# Patient Record
Sex: Female | Born: 1935 | Race: White | Hispanic: No | Smoking: Never smoker
Health system: Southern US, Community
[De-identification: ages and names within clinical notes are randomized; demographics above are authoritative.]

## PROBLEM LIST (undated history)

## (undated) DIAGNOSIS — I509 Heart failure, unspecified: Secondary | ICD-10-CM

## (undated) DIAGNOSIS — K219 Gastro-esophageal reflux disease without esophagitis: Secondary | ICD-10-CM

## (undated) DIAGNOSIS — N200 Calculus of kidney: Secondary | ICD-10-CM

## (undated) DIAGNOSIS — R42 Dizziness and giddiness: Secondary | ICD-10-CM

## (undated) DIAGNOSIS — M47817 Spondylosis without myelopathy or radiculopathy, lumbosacral region: Secondary | ICD-10-CM

## (undated) DIAGNOSIS — M1990 Unspecified osteoarthritis, unspecified site: Secondary | ICD-10-CM

## (undated) DIAGNOSIS — R112 Nausea with vomiting, unspecified: Secondary | ICD-10-CM

## (undated) DIAGNOSIS — I779 Disorder of arteries and arterioles, unspecified: Secondary | ICD-10-CM

## (undated) DIAGNOSIS — I4819 Other persistent atrial fibrillation: Secondary | ICD-10-CM

## (undated) DIAGNOSIS — T7840XA Allergy, unspecified, initial encounter: Secondary | ICD-10-CM

## (undated) DIAGNOSIS — Z800 Family history of malignant neoplasm of digestive organs: Secondary | ICD-10-CM

## (undated) DIAGNOSIS — I739 Peripheral vascular disease, unspecified: Secondary | ICD-10-CM

## (undated) DIAGNOSIS — G4700 Insomnia, unspecified: Secondary | ICD-10-CM

## (undated) DIAGNOSIS — C50919 Malignant neoplasm of unspecified site of unspecified female breast: Secondary | ICD-10-CM

## (undated) DIAGNOSIS — Z9889 Other specified postprocedural states: Secondary | ICD-10-CM

## (undated) HISTORY — DX: Peripheral vascular disease, unspecified: I73.9

## (undated) HISTORY — DX: Spondylosis without myelopathy or radiculopathy, lumbosacral region: M47.817

## (undated) HISTORY — DX: Family history of malignant neoplasm of digestive organs: Z80.0

## (undated) HISTORY — DX: Disorder of arteries and arterioles, unspecified: I77.9

## (undated) HISTORY — DX: Unspecified osteoarthritis, unspecified site: M19.90

## (undated) HISTORY — DX: Other persistent atrial fibrillation: I48.19

## (undated) HISTORY — PX: KIDNEY STONE SURGERY: SHX686

## (undated) HISTORY — DX: Calculus of kidney: N20.0

## (undated) HISTORY — DX: Dizziness and giddiness: R42

## (undated) HISTORY — PX: OTHER SURGICAL HISTORY: SHX169

## (undated) HISTORY — DX: Gastro-esophageal reflux disease without esophagitis: K21.9

## (undated) HISTORY — DX: Insomnia, unspecified: G47.00

---

## 1958-02-14 HISTORY — PX: DILATION AND CURETTAGE OF UTERUS: SHX78

## 1997-07-25 ENCOUNTER — Other Ambulatory Visit: Admission: RE | Admit: 1997-07-25 | Discharge: 1997-07-25 | Payer: Self-pay | Admitting: *Deleted

## 1998-07-24 ENCOUNTER — Encounter: Payer: Self-pay | Admitting: Orthopedic Surgery

## 1998-07-24 ENCOUNTER — Ambulatory Visit (HOSPITAL_COMMUNITY): Admission: RE | Admit: 1998-07-24 | Discharge: 1998-07-24 | Payer: Self-pay | Admitting: Orthopedic Surgery

## 1998-08-17 ENCOUNTER — Other Ambulatory Visit: Admission: RE | Admit: 1998-08-17 | Discharge: 1998-08-17 | Payer: Self-pay | Admitting: *Deleted

## 1999-09-03 ENCOUNTER — Other Ambulatory Visit: Admission: RE | Admit: 1999-09-03 | Discharge: 1999-09-03 | Payer: Self-pay | Admitting: *Deleted

## 2000-10-18 ENCOUNTER — Other Ambulatory Visit: Admission: RE | Admit: 2000-10-18 | Discharge: 2000-10-18 | Payer: Self-pay | Admitting: *Deleted

## 2004-02-25 ENCOUNTER — Other Ambulatory Visit: Admission: RE | Admit: 2004-02-25 | Discharge: 2004-02-25 | Payer: Self-pay | Admitting: Radiology

## 2006-05-03 ENCOUNTER — Ambulatory Visit: Payer: Self-pay | Admitting: Vascular Surgery

## 2006-11-07 ENCOUNTER — Ambulatory Visit: Payer: Self-pay | Admitting: Vascular Surgery

## 2006-11-15 ENCOUNTER — Ambulatory Visit: Payer: Self-pay | Admitting: Vascular Surgery

## 2007-05-08 ENCOUNTER — Ambulatory Visit: Payer: Self-pay | Admitting: Vascular Surgery

## 2007-11-06 ENCOUNTER — Ambulatory Visit: Payer: Self-pay | Admitting: Vascular Surgery

## 2008-04-17 ENCOUNTER — Ambulatory Visit: Payer: Self-pay | Admitting: Vascular Surgery

## 2008-10-21 ENCOUNTER — Ambulatory Visit: Payer: Self-pay | Admitting: Vascular Surgery

## 2009-05-07 ENCOUNTER — Ambulatory Visit: Payer: Self-pay | Admitting: Vascular Surgery

## 2009-11-11 ENCOUNTER — Ambulatory Visit: Payer: Self-pay | Admitting: Vascular Surgery

## 2010-05-13 ENCOUNTER — Other Ambulatory Visit (INDEPENDENT_AMBULATORY_CARE_PROVIDER_SITE_OTHER): Payer: Medicare PPO

## 2010-05-13 ENCOUNTER — Ambulatory Visit (INDEPENDENT_AMBULATORY_CARE_PROVIDER_SITE_OTHER): Payer: Medicare PPO

## 2010-05-13 DIAGNOSIS — I6529 Occlusion and stenosis of unspecified carotid artery: Secondary | ICD-10-CM

## 2010-05-20 NOTE — Procedures (Unsigned)
CAROTID DUPLEX EXAM  INDICATION:  Carotid stenosis.  HISTORY: Diabetes:  No. Cardiac:  No. Hypertension:  No. Smoking:  No. Previous Surgery:  No. CV History:  Currently asymptomatic. Amaurosis Fugax No, Paresthesias No, Hemiparesis No.                                      RIGHT             LEFT Brachial systolic pressure:         130               126 Brachial Doppler waveforms:         Normal            Normal Vertebral direction of flow:        Antegrade         Antegrade DUPLEX VELOCITIES (cm/sec) CCA peak systolic                   96                88 ECA peak systolic                   51                65 ICA peak systolic                   214               71 ICA end diastolic                   50                22 PLAQUE MORPHOLOGY:                  Mixed             Mixed PLAQUE AMOUNT:                      Moderate          Mild PLAQUE LOCATION:                    ICA/ECA           ICA/ECA  IMPRESSION: 1. Doppler velocities suggest a high-end 40% to 59% stenosis of the     right proximal internal carotid artery. 2. No hemodynamically significant stenosis noted in the left internal     carotid artery with plaque formations as described above. 3. No significant change noted when compared to the previous     examination on 11/11/2009.  ___________________________________________ Di Kindle. Edilia Bo, M.D.  CH/MEDQ  D:  05/14/2010  T:  05/14/2010  Job:  [id number]

## 2010-06-29 NOTE — Procedures (Signed)
CAROTID DUPLEX EXAM   INDICATION:  Follow up carotid artery disease.   HISTORY:  Diabetes:  No.  Cardiac:  No.  Hypertension:  No.  Smoking:  No.  Previous Surgery:  No.  CV History:  Amaurosis Fugax No, Paresthesias No, Hemiparesis No                                       RIGHT             LEFT  Brachial systolic pressure:         110               110  Brachial Doppler waveforms:         Biphasic          Biphasic  Vertebral direction of flow:        Antegrade         Antegrade  DUPLEX VELOCITIES (cm/sec)  CCA peak systolic                   100               93  ECA peak systolic                   64                79  ICA peak systolic                   247               65  ICA end diastolic                   77                21  PLAQUE MORPHOLOGY:                  Calcified         Mixed  PLAQUE AMOUNT:                      Moderate to large Mild  PLAQUE LOCATION:                    ICA               Distal CCA, ICA   IMPRESSION:  1. Right ICA stenosis 60-79%.  2. Left ICA stenosis 20-39%.  3. Slight increase in right ICA velocity since previous exam.  Patient      is asymptomatic at this time.   ___________________________________________  Di Kindle. Edilia Bo, M.D.   DP/MEDQ  D:  05/08/2007  T:  05/08/2007  Job:  [id number]

## 2010-06-29 NOTE — Procedures (Signed)
CAROTID DUPLEX EXAM   INDICATION:  Follow up known carotid artery disease.   HISTORY:  Diabetes:  No.  Cardiac:  No.  Hypertension:  No.  Smoking:  No.  Previous Surgery:  CV History:  Amaurosis Fugax No, Paresthesias No, Hemiparesis No                                       RIGHT             LEFT  Brachial systolic pressure:         120               118  Brachial Doppler waveforms:         Biphasic          Biphasic  Vertebral direction of flow:        Antegrade         Antegrade  DUPLEX VELOCITIES (cm/sec)  CCA peak systolic                   87                91  ECA peak systolic                   75                78  ICA peak systolic                   201               99  ICA end diastolic                   57                35  PLAQUE MORPHOLOGY:                  Heterogenous      Heterogenous  PLAQUE AMOUNT:                      Moderate          Mild  PLAQUE LOCATION:                    ICA, ECA          ICA, ECA   IMPRESSION:  1. A 60-90% stenosis noted in the right internal carotid artery.  2. A 20-39%  stenosis noted in the left internal carotid artery.  3. Antegrade bilateral vertebral arteries.   ___________________________________________  Di Kindle. Edilia Bo, M.D.   MG/MEDQ  D:  11/07/2006  T:  11/07/2006  Job:  [id number]

## 2010-06-29 NOTE — Procedures (Signed)
CAROTID DUPLEX EXAM   INDICATION:  Follow up known carotid artery disease.   HISTORY:  Diabetes:  No.  Cardiac:  No.  Hypertension:  No.  Smoking:  No.  Previous Surgery:  No.  CV History:  No.  Amaurosis Fugax No, Paresthesias No, Hemiparesis No                                       RIGHT             LEFT  Brachial systolic pressure:         120               118  Brachial Doppler waveforms:         biphasic          biphasic  Vertebral direction of flow:        antegrade         antegrade  DUPLEX VELOCITIES (cm/sec)  CCA peak systolic                   89                100  ECA peak systolic                   103               96  ICA peak systolic                   247               89  ICA end diastolic                   60                27  PLAQUE MORPHOLOGY:                  heterogeneous     heterogeneous  PLAQUE AMOUNT:                      moderate to severe                  mild  PLAQUE LOCATION:                    bifurcation, ICA and ECA            ICA and ECA.   IMPRESSION:  1. A 60-79% stenosis noted in the right bifurcation and proximal      internal carotid artery.  2. A 20-39% stenosis noted in the left internal carotid artery.  3. Antegrade bilateral vertebral arteries.   ___________________________________________  Di Kindle. Edilia Bo, M.D.   MG/MEDQ  D:  10/21/2008  T:  10/21/2008  Job:  [id number]

## 2010-06-29 NOTE — Procedures (Signed)
CAROTID DUPLEX EXAM   INDICATION:  Followup evaluation of known carotid artery disease.   HISTORY:  Diabetes:  No.  Cardiac:  No.  Hypertension:  No.  Smoking:  No.  Previous Surgery:  No.  CV History:  Previous duplex on 11/06/2007 revealed a 60-79% right ICA  stenosis and 20-39% left ICA stenosis.  The patient reports an episode  of jaw pain and swelling last night.  Amaurosis Fugax No, Paresthesias No, Hemiparesis No                                       RIGHT             LEFT  Brachial systolic pressure:         146               138  Brachial Doppler waveforms:         Triphasic         Triphasic  Vertebral direction of flow:        Antegrade         Antegrade  DUPLEX VELOCITIES (cm/sec)  CCA peak systolic                   77                107  ECA peak systolic                   121               99  ICA peak systolic                   275               90  ICA end diastolic                   71                33  PLAQUE MORPHOLOGY:                  Mixed             Soft  PLAQUE AMOUNT:                      Moderate          Mild  PLAQUE LOCATION:                    Distal CCA, proximal ICA            Proximal ICA   IMPRESSION:  1. Distal right CCA peak systolic velocity 258 and diastolic velocity      27.  2. 60-79% right ICA stenosis.  3. 20-39% left ICA stenosis.  4. No significant change from previous study.   ___________________________________________  Di Kindle. Edilia Bo, M.D.   MC/MEDQ  D:  04/17/2008  T:  04/17/2008  Job:  [id number]

## 2010-06-29 NOTE — Procedures (Signed)
CAROTID DUPLEX EXAM   INDICATION:  Follow up carotid artery disease.   HISTORY:  Diabetes:  No.  Cardiac:  No.  Hypertension:  No.  Smoking:  No.  Previous Surgery:  No.  CV History:  Asymptomatic.  Amaurosis Fugax No, Paresthesias No, Hemiparesis No.                                       RIGHT             LEFT  Brachial systolic pressure:         138               140  Brachial Doppler waveforms:         WNL               WNL  Vertebral direction of flow:        Antegrade         Antegrade  DUPLEX VELOCITIES (cm/sec)  CCA peak systolic                   M = 91, D = 186   97  ECA peak systolic                   140               76  ICA peak systolic                   212               115  ICA end diastolic                   53                43  PLAQUE MORPHOLOGY:                  Heterogenous      Heterogenous  PLAQUE AMOUNT:                      Moderate          Mild  PLAQUE LOCATION:                    Bifurcation/ICA   Proximal  ICA/bifurcation   IMPRESSION:  1. Right internal carotid artery shows evidence of 40% to 59% stenosis      (top end of range).  Focal calcific plaque noted within the right      distal common carotid artery and right proximal internal carotid      artery.  Unable to obtain velocities of previous study.  2. Left internal carotid artery now shows evidence of 40% to 59%      stenosis (low end of range).    ___________________________________________  Di Kindle. Edilia Bo, M.D.   OD/MEDQ  D:  11/11/2009  T:  11/11/2009  Job:  [id number]

## 2010-06-29 NOTE — Assessment & Plan Note (Signed)
OFFICE VISIT   TERESEA, DONLEY  DOB:  [birth year]                                       11/07/2006  EAVWU#:[id number]   I saw the patient in the office today for continued followup of her  carotid disease.  Since I saw her last in March of this year, she has  had no history of stroke, TIAs, expressive or receptive aphasia, or  amaurosis fugax.   On review of systems, she has had no recent chest pain, chest pressure,  palpitations, or arrhythmias.  She does admit to some arthritis in her  back and neck and occasionally has some problems with standing.  I did  not get any history of claudication or rest pain.   PHYSICAL EXAMINATION:  Blood pressure is 130/77.  Heart rate is 84.  She  has a right carotid bruit.  Lungs are clear bilaterally to auscultation.  Cardiac:  She has a regular rate and rhythm.  Her abdomen is soft and  nontender.  Neurologic:  Nonfocal.   Her carotid duplex scan shows that she has a 60-79% right carotid  stenosis in the mid range.  She has a less than 39% left carotid  stenosis.  Both vertebral arteries are patent with normally directed  flow, and her arm pressures are equal.   She understands we would not consider right carotid endarterectomy  unless the stenosis progressed to greater than 80% or she develops new  neurologic symptoms.  I plan on seeing her back in six months with a  follow-up duplex scan.  She knows to call sooner if she has problems.  In the meantime, she knows to continue taking her aspirin.   In addition, she had some concerns about some varicose veins in her  legs, and I have suggested she consider making an appointment with the  vein center here.   Di Kindle. Edilia Bo, M.D.  Electronically Signed   CSD/MEDQ  D:  11/07/2006  T:  11/08/2006  Job:  [id number]

## 2010-06-29 NOTE — Procedures (Signed)
CAROTID DUPLEX EXAM   INDICATION:  Follow up of carotid disease.   HISTORY:  Diabetes:  No.  Cardiac:  No.  Hypertension:  No.  Smoking:  No.  Previous Surgery:  No.  CV History:  Asymptomatic.  Amaurosis Fugax , Paresthesias , Hemiparesis                                       RIGHT             LEFT  Brachial systolic pressure:         142               140  Brachial Doppler waveforms:         Triphasic         Triphasic  Vertebral direction of flow:        Antegrade         Antegrade  DUPLEX VELOCITIES (cm/sec)  CCA peak systolic                   116               107  ECA peak systolic                   85                94  ICA peak systolic                   270 (BIF)         98  ICA end diastolic                   84                37  PLAQUE MORPHOLOGY:                  Calcified         Mixed  PLAQUE AMOUNT:                      Moderate-to-severe                  Mild  PLAQUE LOCATION:                    Bifurcation, ICA  CCA   IMPRESSION:  1. 60-79% stenosis noted in the right bifurcation and proximal      internal carotid artery.  2. Mild plaque noted at the left common carotid artery, bifurcation,      internal carotid artery.        ___________________________________________  Di Kindle. Edilia Bo, M.D.   CJ/MEDQ  D:  05/07/2009  T:  05/07/2009  Job:  [id number]

## 2010-06-29 NOTE — Assessment & Plan Note (Signed)
OFFICE VISIT   Colleen Galloway, Colleen Galloway  DOB:  1934/05/17                                       11/11/2009  [id number]#:[id number]   I saw this patient in the office today for continued followup of her  carotid disease.  I had last seen her back in September 2008.  She had a  60% to 79% right carotid stenosis in the mid range and a less than 39%  left carotid stenosis.  She comes in for routine visit.  Since I saw her  last, she has had no history of stroke, TIAs, expressive or receptive  aphasia, or amaurosis fugax.   PAST MEDICAL HISTORY:  Significant for hypercholesterolemia which is  stable on her current medications.  She denies any history of diabetes,  history of hypertension, history of myocardial infarction or history of  congestive heart failure.   SOCIAL HISTORY:  She is widowed.  She does not have any children.  She  does not use tobacco.   REVIEW OF SYSTEMS:  CARDIOVASCULAR:  She notes an occasional skipped  heart beat.  She has had no chest pain or chest pressure.  She has had  no orthopnea and no significant dyspnea on exertion.  She has had no  history of claudication, rest pain or nonhealing ulcers.  She has had  phlebitis in the past.  NEUROLOGIC:  She has occasional dizziness.  GU:  She does have some dysuria and occasional urinary frequency.   PHYSICAL EXAMINATION:  General:  This is a pleasant 76 year old woman  who appears stated age.  Vital signs:  Blood pressure 127/80, heart rate  is 63, saturation 98%.  Lungs:  Clear bilaterally to auscultation.  Cardiovascular:  She has a right carotid bruit.  She has a regular rate  and rhythm.  She has palpable femoral pulses and warm well-perfused feet  without ischemic ulcers.  She has no significant lower extremity  swelling.  Abdomen:  Soft and nontender with normal-pitched bowel  sounds.  Neurologic:  Nonfocal.   I did independently interpret her carotid duplex scan today which shows  a 40%-59%  right carotid stenosis in the higher end that range with no  significant carotid stenosis on the left.  Both vertebral arteries are  patent with normally directed flow.  The velocities on the right have  actually improved compared to her most recent study in March 2011 when  she was at the lower end of 60%-79% stenosis.   She is due for a follow-up study in 6 months.  If this remains stable,  we might be able to stretch her followup out to a year.  I will plan on  seeing her back in 18 months unless there is any significant change in  her carotid duplex or she develops any new neurologic symptoms.  She  does know to continue taking her aspirin.     Di Kindle. Edilia Bo, M.D.  Electronically Signed   CSD/MEDQ  D:  11/11/2009  T:  11/12/2009  Job:  [id number]   cc:   Dr. Abner Greenspan

## 2010-06-29 NOTE — Procedures (Signed)
CAROTID DUPLEX EXAM   INDICATION:  Follow-up evaluation of known carotid artery disease.   HISTORY:  Diabetes:  No.  Cardiac:  No.  Hypertension:  No.  Smoking:  No.  Previous Surgery:  No.  CV History:  Previous duplex performed on 05/08/07 revealed 60-79% right  ICA stenosis and 20-39% left ICA stenosis.  Amaurosis Fugax No, Paresthesias No, Hemiparesis No.                                       RIGHT             LEFT  Brachial systolic pressure:         128               128  Brachial Doppler waveforms:         Triphasic         Triphasic  Vertebral direction of flow:        Antegrade         Antegrade  DUPLEX VELOCITIES (cm/sec)  CCA peak systolic                   57                83  ECA peak systolic                   74                66  ICA peak systolic                   231               75  ICA end diastolic                   69                25  PLAQUE MORPHOLOGY:                  Soft              Soft  PLAQUE AMOUNT:                      Moderate          Mild  PLAQUE LOCATION:                    Proximal ICA      Proximal ICA   IMPRESSION:  1. 60-79% right internal carotid artery stenosis.  2. 20-39% left internal carotid artery stenosis.  3. No significant change from previous study performed on 05/08/07.   ___________________________________________  Di Kindle. Edilia Bo, M.D.   MC/MEDQ  D:  11/06/2007  T:  11/06/2007  Job:  [id number]

## 2010-06-29 NOTE — Assessment & Plan Note (Signed)
OFFICE VISIT   Colleen Galloway, Colleen Galloway  DOB:  June 06, 1934                                       11/15/2006  [id number]#:[id number]   The patient presents today for evaluation of lower extremity discomfort.  She is concerned regarding pain, burning sensations in both lower  extremities.  This seems to be most pronounced over her right lateral  calf and left pretibial area.  She does not have any distal swelling in  her lower extremities.  Does not have any history of deep vein  thrombosis or superficial thrombophlebitis.  She has had only graduated  compression stockings 20-33 mmHg 3 to 4 years and does elevate her legs  when possible.  She has had multiple prior sclerotherapy sessions at an  outlying vein center from 2004 through 2007.  She has a past medical  history of elevated triglycerides, having known right carotid stenosis.  She has been followed with ultrasound.  Has not had a history of cardiac  disease or stroke, cancer, or diabetes.   PHYSICAL EXAM:  She is a well-developed, well-nourished white female  appearing stated age of 80.  Blood pressure is 129/80, pulse 70,  respirations 16.  She has 2+ dorsalis pedis pulses bilaterally.  She  does not have any reticular or varicose veins.  She does have spider  vein telangiectasia over her right knee and lateral calf, and throughout  her left calf as well.  She underwent hand-held venous duplex by me, and  this shows she has a small size of her great saphenous vein bilaterally  with no evidence of reflux.  I discussed options with the patient.  I  explained that, I would not expect any symptom release of the stinging  sensation that she has with any attempted sclerotherapy.  She reports  that she has never had any pain resolution when she had prior  sclerotherapy attempts in the past.  She was reassured with this  finding.  Will continue her compression garment use, and will notify us  should she wish to proceed  with  sclerotherapy for cosmetic reasons.  Otherwise, she will see [place] on an as  needed basis.  Continue her followup with Dr. Edilia Bo in 6 months for  followup of her asymptomatic carotid stenosis.   Larina Earthly, M.D.  Electronically Signed   TFE/MEDQ  D:  11/15/2006  T:  11/16/2006  Job:  [id number]   cc:   Di Kindle. Edilia Bo, M.D.  Dr. Abner Greenspan

## 2010-09-26 ENCOUNTER — Encounter: Payer: Self-pay | Admitting: Internal Medicine

## 2010-09-27 ENCOUNTER — Encounter: Payer: Self-pay | Admitting: Internal Medicine

## 2010-10-07 ENCOUNTER — Encounter: Payer: Self-pay | Admitting: Cardiovascular Disease

## 2010-10-19 ENCOUNTER — Encounter: Payer: Self-pay | Admitting: Cardiovascular Disease

## 2010-10-20 ENCOUNTER — Ambulatory Visit (INDEPENDENT_AMBULATORY_CARE_PROVIDER_SITE_OTHER): Payer: Medicare PPO | Admitting: Cardiovascular Disease

## 2010-10-20 ENCOUNTER — Encounter: Payer: Self-pay | Admitting: Cardiovascular Disease

## 2010-10-20 DIAGNOSIS — R0600 Dyspnea, unspecified: Secondary | ICD-10-CM | POA: Insufficient documentation

## 2010-10-20 DIAGNOSIS — R0989 Other specified symptoms and signs involving the circulatory and respiratory systems: Secondary | ICD-10-CM | POA: Insufficient documentation

## 2010-10-20 DIAGNOSIS — R002 Palpitations: Secondary | ICD-10-CM | POA: Insufficient documentation

## 2010-10-20 DIAGNOSIS — E782 Mixed hyperlipidemia: Secondary | ICD-10-CM

## 2010-10-20 DIAGNOSIS — R0609 Other forms of dyspnea: Secondary | ICD-10-CM

## 2010-10-20 DIAGNOSIS — R0602 Shortness of breath: Secondary | ICD-10-CM

## 2010-10-20 HISTORY — DX: Mixed hyperlipidemia: E78.2

## 2010-10-20 HISTORY — DX: Other specified symptoms and signs involving the circulatory and respiratory systems: R09.89

## 2010-10-20 NOTE — Assessment & Plan Note (Signed)
F/U duplex Dr Durwin Nora  ASA

## 2010-10-20 NOTE — Progress Notes (Signed)
76 yo referred by Dr Tomasa Blase for vascular disease, palpitations ? PAF, and dyspnea.  Seen in [place] ER 8/14 no records available.  Reviewed records from Dr Tomasa Blase 10/06/10.  I don't think afib was ever documented.  Has had palpitations for over a year.  Bening skips usually not long lasting but did last all afternoon the day she went to ER.  Given lopresser but after a week stopped it because she felt awful and dizzy.  Given cardizem to replace it but read side effects and didn;t take it.  Known carotid disease followed by Dr Durwin Nora.  60-79% RICA  No symptoms of TIA.  On asa.  Mild exertional dsypnea.  No PND or orthopnea.  Nonsmoker with no chronic lung disease  Does all ADL and walks well.    ROS: Denies fever, malais, weight loss, blurry vision, decreased visual acuity, cough, sputum, SOB, hemoptysis, pleuritic pain, palpitaitons, heartburn, abdominal pain, melena, lower extremity edema, claudication, or rash.  All other systems reviewed and negative   General: Affect appropriate Healthy:  appears stated age HEENT: normal Neck supple with no adenopathy JVP normal right  bruits no thyromegaly Lungs clear with no wheezing and good diaphragmatic motion Heart:  S1/S2 no murmur,rub, gallop or click PMI normal Abdomen: benighn, BS positve, no tenderness, no AAA no bruit.  No HSM or HJR Distal pulses intact with no bruits No edema Neuro non-focal Skin warm and dry No muscular weakness  Medications Current Outpatient Prescriptions  Medication Sig Dispense Refill  . aspirin 325 MG EC tablet Take 325 mg by mouth daily.        . calcium citrate-vitamin D (CITRACAL+D) 315-200 MG-UNIT per tablet Take 1 tablet by mouth 2 (two) times daily.        Marland Kitchen doxycycline (ADOXA) 50 MG tablet Take 50 mg by mouth daily.        Marland Kitchen estradiol (ESTRACE) 0.1 MG/GM vaginal cream Place 2 g vaginally daily.        . fish oil-omega-3 fatty acids 1000 MG capsule Take 2 g by mouth daily.        . Multiple Vitamin  (MULTIVITAMIN) capsule Take 1 capsule by mouth daily.        . Multiple Vitamin (MULTIVITAMIN) tablet Take 1 tablet by mouth daily.        Marland Kitchen omeprazole (PRILOSEC) 20 MG capsule Take 20 mg by mouth daily.        . simvastatin (ZOCOR) 40 MG tablet Take 40 mg by mouth at bedtime.       . traZODone (DESYREL) 50 MG tablet Take 50 mg by mouth at bedtime.        . vitamin C (ASCORBIC ACID) 500 MG tablet Take 500 mg by mouth daily.          Allergies Lopressor and Sodium pantothenate  Family History: Family History  Problem Relation Age of Onset  . Heart disease Mother   . Asthma Mother   . Hypertension Mother   . Cancer Mother   . Heart disease Father   . Hypertension Father   . Stroke Father   . Heart disease Sister   . Hypertension Sister   . Heart disease Brother   . Asthma Brother   . Hypertension Brother   . Heart disease Other   . Hypertension Other   . Colon cancer Other     Social History: History   Social History  . Marital Status: Married    Spouse Name: N/A  Number of Children: N/A  . Years of Education: N/A   Occupational History  . Not on file.   Social History Main Topics  . Smoking status: Never Smoker   . Smokeless tobacco: Not on file  . Alcohol Use: No  . Drug Use:   . Sexually Active:    Other Topics Concern  . Not on file   Social History Narrative   Non smoker/ no tobacco useCurrent work/retiredMartial status/widowedNon drinker/ no alcohol useSeat belt use/ always wears seatbeltCaffeine useGuns in the home    Electrocardiogram:  NSR 67 nonspecific ST/T wave changes  Assessment and Plan

## 2010-10-20 NOTE — Assessment & Plan Note (Signed)
In elderly female with known carotid disease with do stress myovue and echo

## 2010-10-20 NOTE — Assessment & Plan Note (Signed)
Event monitor to R/O PAF  See if we can get records from [facility] ER

## 2010-10-20 NOTE — Assessment & Plan Note (Signed)
Reviewed labs from [place]  Last LDL under 80  Continue statin

## 2010-10-20 NOTE — Patient Instructions (Signed)
Your physician recommends that you schedule a follow-up appointment in: 4-5 WEEKS WITH DR [facility] Your physician recommends that you continue on your current medications as directed. Please refer to the Current Medication list given to you today.  Your physician has requested that you have an echocardiogram. Echocardiography is a painless test that uses sound waves to create images of your heart. It provides your doctor with information about the size and shape of your heart and how well your heart's chambers and valves are working. This procedure takes approximately one hour. There are no restrictions for this procedure. DX DYSPNEA Your physician has requested that you have en exercise stress myoview. For further information please visit [email or web address] Please follow instruction sheet, as given. DX DYSPNEA  Your physician has recommended that you wear an event monitor. Event monitors are medical devices that record the heart's electrical activity. Doctors most often [place] these monitors to diagnose arrhythmias. Arrhythmias are problems with the speed or rhythm of the heartbeat. The monitor is a small, portable device. You can wear one while you do your normal daily activities. This is usually used to diagnose what is causing palpitations/syncope (passing out). 21 DAY EVENT MONITOR  DX 785.1

## 2010-10-21 ENCOUNTER — Telehealth: Payer: Self-pay | Admitting: Cardiovascular Disease

## 2010-10-21 NOTE — Telephone Encounter (Signed)
Records received from [facility] for Pt Appt with [facility] 11/24/10 @ 11:15 gave to [facility]  10/21/10/km

## 2010-10-25 ENCOUNTER — Encounter: Payer: Self-pay | Admitting: *Deleted

## 2010-11-02 ENCOUNTER — Ambulatory Visit (HOSPITAL_BASED_OUTPATIENT_CLINIC_OR_DEPARTMENT_OTHER): Payer: Medicare HMO | Admitting: Radiology

## 2010-11-02 ENCOUNTER — Encounter (INDEPENDENT_AMBULATORY_CARE_PROVIDER_SITE_OTHER): Payer: Medicare HMO

## 2010-11-02 ENCOUNTER — Encounter (HOSPITAL_COMMUNITY): Payer: Self-pay | Admitting: Radiology

## 2010-11-02 ENCOUNTER — Ambulatory Visit (HOSPITAL_COMMUNITY): Payer: Medicare HMO | Attending: Cardiovascular Disease | Admitting: Radiology

## 2010-11-02 VITALS — Ht 64.0 in | Wt 161.0 lb

## 2010-11-02 DIAGNOSIS — R072 Precordial pain: Secondary | ICD-10-CM

## 2010-11-02 DIAGNOSIS — I379 Nonrheumatic pulmonary valve disorder, unspecified: Secondary | ICD-10-CM | POA: Insufficient documentation

## 2010-11-02 DIAGNOSIS — R0989 Other specified symptoms and signs involving the circulatory and respiratory systems: Secondary | ICD-10-CM | POA: Insufficient documentation

## 2010-11-02 DIAGNOSIS — R0602 Shortness of breath: Secondary | ICD-10-CM

## 2010-11-02 DIAGNOSIS — R002 Palpitations: Secondary | ICD-10-CM | POA: Insufficient documentation

## 2010-11-02 DIAGNOSIS — I4949 Other premature depolarization: Secondary | ICD-10-CM

## 2010-11-02 DIAGNOSIS — E785 Hyperlipidemia, unspecified: Secondary | ICD-10-CM | POA: Insufficient documentation

## 2010-11-02 DIAGNOSIS — I079 Rheumatic tricuspid valve disease, unspecified: Secondary | ICD-10-CM | POA: Insufficient documentation

## 2010-11-02 DIAGNOSIS — I491 Atrial premature depolarization: Secondary | ICD-10-CM

## 2010-11-02 DIAGNOSIS — R0609 Other forms of dyspnea: Secondary | ICD-10-CM | POA: Insufficient documentation

## 2010-11-02 MED ORDER — TECHNETIUM TC 99M TETROFOSMIN IV KIT
33.0000 | PACK | Freq: Once | INTRAVENOUS | Status: AC | PRN
  Administered 2010-11-02: 33 via INTRAVENOUS

## 2010-11-02 MED ORDER — TECHNETIUM TC 99M TETROFOSMIN IV KIT
11.0000 | PACK | Freq: Once | INTRAVENOUS | Status: AC | PRN
  Administered 2010-11-02: 11 via INTRAVENOUS

## 2010-11-02 NOTE — Progress Notes (Signed)
[facility] SITE 3 NUCLEAR MED [address] [place] [address] [phone]  Cardiology Nuclear Med Study  Colleen Galloway is a 76 y.o. female [id number] [birth year]   Nuclear Med Background Indication for Stress Test:  Evaluation for Ischemia; Recent ED Visit for Palpitations History:  No previous documented CAD Cardiac Risk Factors: Lipids; Carotid Bruit  Symptoms:  DOE, Fatigue, Fatigue with Exertion, Light-Headedness, Nausea, Palpitations and SOB   Nuclear Pre-Procedure Caffeine/Decaff Intake:  None NPO After: 7:30am   Lungs:  Clear. IV 0.9% NS with Angio Cath:  20g  IV Site: R Antecubital  IV Started by:  Stanton Kidney, EMT-P  Chest Size (in):  40 Cup Size: DD  Height: 5\' 4"  (1.626 m)  Weight:  161 lb (73.029 kg)  BMI:  Body mass index is 27.64 kg/(m^2). Tech Comments:  n/a    Nuclear Med Study 1 or 2 day study: 1 day  Stress Test Type:  Stress  Reading MD: Kristeen Miss, MD  Order Authorizing Provider:  Charlton Haws, MD  Resting Radionuclide: Technetium 46m Tetrofosmin  Resting Radionuclide Dose: 11.0 mCi   Stress Radionuclide:  Technetium 43m Tetrofosmin  Stress Radionuclide Dose: 33.0 mCi           Stress Protocol Rest HR: 64 Stress HR: 153  Rest BP: 139/80 Stress BP: 158/77  Exercise Time (min): 4:01 METS: 5.8   Predicted Max HR: 147 bpm % Max HR: 104.08 bpm Rate Pressure Product: 21308   Dose of Adenosine (mg):  n/a Dose of Lexiscan: n/a mg  Dose of Atropine (mg): n/a Dose of Dobutamine: n/a mcg/kg/min (at max HR)  Stress Test Technologist: Irean Hong, RN  Nuclear Technologist:  Domenic Polite, CNMT     Rest Procedure:  Myocardial perfusion imaging was performed at rest 45 minutes following the intravenous administration of Technetium 8m Tetrofosmin.  Rest ECG: No acute changes.  Stress Procedure:  The patient exercised for 4:01 on the treadmill utilizing the Bruce protocol.  The patient stopped due to dyspnea  and denied any  chest pain.  There were nonspecific ST-T wave changes, frequent PVC's with couplets and occasional PAC's.  Technetium 14m Tetrofosmin was injected at peak exercise and myocardial perfusion imaging was performed after a brief delay.  Stress ECG: No significant change from baseline ECG  QPS Raw Data Images:  Normal; no motion artifact; normal heart/lung ratio. Stress Images:  Normal homogeneous uptake in all areas of the myocardium. Rest Images:  Normal homogeneous uptake in all areas of the myocardium. Subtraction (SDS):  Normal Transient Ischemic Dilatation (Normal <1.22):  1.00 Lung/Heart Ratio (Normal <0.45):  0.34  Quantitative Gated Spect Images QGS EDV:  60 ml QGS ESV:  17 ml QGS cine images:  NL LV Function; NL Wall Motion QGS EF: 72%  Impression Exercise Capacity:  Fair exercise capacity. BP Response:  Normal blood pressure response. Clinical Symptoms:  No chest pain. ECG Impression:  No significant ST segment change suggestive of ischemia. Comparison with Prior Nuclear Study: No previous nuclear study performed  Overall Impression:  Normal stress nuclear study.  No evidence of ischemia.  Normal LV function.    [name], Montez Hageman., MD, [facility]

## 2010-11-11 ENCOUNTER — Encounter: Payer: Self-pay | Admitting: Cardiovascular Disease

## 2010-11-24 ENCOUNTER — Ambulatory Visit (INDEPENDENT_AMBULATORY_CARE_PROVIDER_SITE_OTHER): Payer: Medicare HMO | Admitting: Cardiovascular Disease

## 2010-11-24 ENCOUNTER — Encounter: Payer: Self-pay | Admitting: Cardiovascular Disease

## 2010-11-24 DIAGNOSIS — Z79899 Other long term (current) drug therapy: Secondary | ICD-10-CM

## 2010-11-24 DIAGNOSIS — E782 Mixed hyperlipidemia: Secondary | ICD-10-CM

## 2010-11-24 DIAGNOSIS — I480 Paroxysmal atrial fibrillation: Secondary | ICD-10-CM

## 2010-11-24 DIAGNOSIS — I4891 Unspecified atrial fibrillation: Secondary | ICD-10-CM

## 2010-11-24 DIAGNOSIS — R0989 Other specified symptoms and signs involving the circulatory and respiratory systems: Secondary | ICD-10-CM

## 2010-11-24 HISTORY — DX: Paroxysmal atrial fibrillation: I48.0

## 2010-11-24 MED ORDER — FLECAINIDE ACETATE 50 MG PO TABS: 50.0000 mg | ORAL_TABLET | Freq: Two times a day (BID) | ORAL | Status: DC

## 2010-11-24 MED ORDER — NEBIVOLOL HCL 5 MG PO TABS: 5.0000 mg | ORAL_TABLET | Freq: Every day | ORAL | Status: DC

## 2010-11-24 NOTE — Progress Notes (Signed)
76 yo with PAF.  In NSR during two office visits with me.  Event monitor with PAF and relatively rapid rate.  Multiple episodes since last visit.  Symptomatic with pressure in chest some nausea.  Recent loose stools.  Myovue just done and normal as was echo with no LVH and normal septal thickness.  Discussed with Dr Johney Frame EPS.  Antiarrhythmic needed for symptomatic relief of PAF.  Start Flecainide 50 bid and low dose bystolic.  [place] score one continue ASA.  Right carotid bruit followed by Dr Durwin Nora.  40-50% RICA No TIA symptoms.  On ASA  ROS: Denies fever, malais, weight loss, blurry vision, decreased visual acuity, cough, sputum, SOB, hemoptysis, pleuritic pain, palpitaitons, heartburn, abdominal pain, melena, lower extremity edema, claudication, or rash.  All other systems reviewed and negative  General: Affect appropriate Healthy:  appears stated age HEENT: normal Neck supple with no adenopathy JVP normal right  bruits no thyromegaly Lungs clear with no wheezing and good diaphragmatic motion Heart:  S1/S2 no murmur,rub, gallop or click PMI normal Abdomen: benighn, BS positve, no tenderness, no AAA no bruit.  No HSM or HJR Distal pulses intact with no bruits No edema Neuro non-focal Skin warm and dry No muscular weakness   Current Outpatient Prescriptions  Medication Sig Dispense Refill  . aspirin 325 MG EC tablet Take 325 mg by mouth daily.        . calcium citrate-vitamin D (CITRACAL+D) 315-200 MG-UNIT per tablet Take 1 tablet by mouth 2 (two) times daily.        Marland Kitchen doxycycline (ADOXA) 50 MG tablet Take 50 mg by mouth daily.        Marland Kitchen estradiol (ESTRACE) 0.1 MG/GM vaginal cream Place 2 g vaginally daily.        . fish oil-omega-3 fatty acids 1000 MG capsule Take 2 g by mouth daily.        . Multiple Vitamin (MULTIVITAMIN) capsule Take 1 capsule by mouth daily.        Marland Kitchen omeprazole (PRILOSEC) 20 MG capsule Take 20 mg by mouth daily.        . simvastatin (ZOCOR) 40 MG tablet Take 40 mg  by mouth at bedtime.       . traZODone (DESYREL) 50 MG tablet Take 50 mg by mouth at bedtime.        . vitamin C (ASCORBIC ACID) 500 MG tablet Take 500 mg by mouth daily.          Allergies  Lopressor and Sodium pantothenate  Electrocardiogram:  NSR 61 nonspecific ST/T wave changes QT 402  Assessment and Plan

## 2010-11-24 NOTE — Patient Instructions (Signed)
Your physician has requested that you have an exercise tolerance test. For further information please visit [email or web address] Please also follow instruction sheet, as given. 2-3 WEEKS WITH DR Eden Emms  DX [phone]  Your physician has recommended you make the following change in your medication: START BYSTOLIC 5 MG  EVERY DAY  FLECAINIDE 50 MG TWICE DAILY

## 2010-11-24 NOTE — Assessment & Plan Note (Signed)
No evidence of structural heart disease.  Start Flecainide. ETT once she is known to tolerate it and see if it helps.  2-3 weeks.  Start low dose bystolic as she goes fast in afib.  Dizzyness with lopresser in past will have to see if she tolerates bystolic.  ASA

## 2010-11-24 NOTE — Assessment & Plan Note (Signed)
F/U duplex apparantly yearly as stenosis has been stable

## 2010-11-24 NOTE — Assessment & Plan Note (Signed)
Cholesterol is at goal.  Continue current dose of statin and diet Rx.  No myalgias or side effects.  F/U  LFT's in 6 months. No results found for this basename: LDLCALC  Labs with primary            

## 2010-12-10 ENCOUNTER — Ambulatory Visit (INDEPENDENT_AMBULATORY_CARE_PROVIDER_SITE_OTHER): Payer: Medicare HMO | Admitting: Cardiovascular Disease

## 2010-12-10 DIAGNOSIS — Z79899 Other long term (current) drug therapy: Secondary | ICD-10-CM

## 2010-12-10 DIAGNOSIS — I4891 Unspecified atrial fibrillation: Secondary | ICD-10-CM

## 2010-12-10 NOTE — Progress Notes (Deleted)
Exercise Treadmill Test  Pre-Exercise Testing Evaluation Rhythm: normal sinus  Rate: 64   PR:  .15 QRS:  .07  QT:  .40 QTc: .41     Test  Exercise Tolerance Test Ordering MD: Charlton Haws, MD  Interpreting MD:  Charlton Haws, MD  Unique Test No: 1  Treadmill:  1  Indication for ETT: A-Fib  Contraindication to ETT: No   Stress Modality: exercise - treadmill  Cardiac Imaging Performed: non   Protocol: standard Bruce - maximal  Max BP:  ***/***  Max MPHR (bpm):  146 85% MPR (bpm):  124  MPHR obtained (bpm):  *** % MPHR obtained:  ***  Reached 85% MPHR (min:sec):  *** Total Exercise Time (min-sec):  ***  Workload in METS:  *** Borg Scale: ***  Reason ETT Terminated:  {CHL REASON TERMINATED FOR WUJ:81191478}    ST Segment Analysis At Rest: {CHL ST SEGMENT AT REST FOR GNF:62130865} With Exercise: {CHL ST SEGMENT WITH EXERCISE FOR HQI:69629528}  Other Information Arrhythmia:  {CHL ARRHYTHMIA FOR UXL:24401027} Angina during ETT:  {CHL ANGINA DURING OZD:66440347} Quality of ETT:  {CHL QUALITY OF QQV:95638756}  ETT Interpretation:  {CHL INTERPRETATION FOR EPP:29518841}  Comments: ***  Recommendations: ***

## 2011-01-07 ENCOUNTER — Telehealth: Payer: Self-pay | Admitting: Cardiovascular Disease

## 2011-01-07 NOTE — Telephone Encounter (Signed)
Fu call °Pt returning your call  °

## 2011-01-10 NOTE — Telephone Encounter (Signed)
SEE DOCUMENTATION ON  EVENT MONITOR RESULTS./CY

## 2011-01-10 NOTE — Telephone Encounter (Signed)
Fu call Pt returning your call again

## 2011-01-19 ENCOUNTER — Ambulatory Visit: Payer: Medicare HMO | Admitting: Cardiovascular Disease

## 2011-02-01 ENCOUNTER — Ambulatory Visit (INDEPENDENT_AMBULATORY_CARE_PROVIDER_SITE_OTHER): Payer: Medicare HMO | Admitting: Cardiovascular Disease

## 2011-02-01 ENCOUNTER — Encounter: Payer: Self-pay | Admitting: Cardiovascular Disease

## 2011-02-01 DIAGNOSIS — I4891 Unspecified atrial fibrillation: Secondary | ICD-10-CM

## 2011-02-01 DIAGNOSIS — I480 Paroxysmal atrial fibrillation: Secondary | ICD-10-CM

## 2011-02-01 DIAGNOSIS — R0989 Other specified symptoms and signs involving the circulatory and respiratory systems: Secondary | ICD-10-CM

## 2011-02-01 DIAGNOSIS — E782 Mixed hyperlipidemia: Secondary | ICD-10-CM

## 2011-02-01 NOTE — Assessment & Plan Note (Signed)
F/U duplex April 2013  No TIA symptoms ASA

## 2011-02-01 NOTE — Progress Notes (Signed)
76 yo with PAF. In NSR during two office visits with me. Event monitor with PAF and relatively rapid rate. Multiple episodes since last visit. Symptomatic with pressure in chest some nausea. Recent loose stools. Myovue just done and normal as was echo with no LVH and normal septal thickness. Discussed with Dr Johney Frame EPS. Antiarrhythmic needed for symptomatic relief of PAF. Started  Flecainide 50 bid and low dose bystolic last visit..  She took both for about 8 days and stopped.  Indicated intolerabe fatigue, dyspnea and visual changes.  It[place] score one continue ASA. Right carotid bruit followed by Dr Durwin Nora. 40-50% RICA No TIA symptoms. On ASA   ROS: Denies fever, malais, weight loss, blurry vision, decreased visual acuity, cough, sputum, SOB, hemoptysis, pleuritic pain, palpitaitons, heartburn, abdominal pain, melena, lower extremity edema, claudication, or rash.  All other systems reviewed and negative  General: Affect appropriate Healthy:  appears stated age HEENT: normal Neck supple with no adenopathy JVP normal right  bruits no thyromegaly Lungs clear with no wheezing and good diaphragmatic motion Heart:  S1/S2 no murmur,rub, gallop or click PMI normal Abdomen: benighn, BS positve, no tenderness, no AAA no bruit.  No HSM or HJR Distal pulses intact with no bruits No edema Neuro non-focal Skin warm and dry No muscular weakness   Current Outpatient Prescriptions  Medication Sig Dispense Refill  . aspirin 325 MG EC tablet Take 325 mg by mouth daily.        . calcium citrate-vitamin D (CITRACAL+D) 315-200 MG-UNIT per tablet Take 1 tablet by mouth 2 (two) times daily.        Marland Kitchen doxycycline (ADOXA) 50 MG tablet Take 50 mg by mouth as needed.       Marland Kitchen estradiol (ESTRACE) 0.1 MG/GM vaginal cream Place 2 g vaginally daily.        . fish oil-omega-3 fatty acids 1000 MG capsule Take 2 g by mouth daily.        . Multiple Vitamin (MULTIVITAMIN) capsule Take 1 capsule by mouth daily.        Marland Kitchen  omeprazole (PRILOSEC) 20 MG capsule Take 20 mg by mouth daily.        . simvastatin (ZOCOR) 40 MG tablet Take 40 mg by mouth at bedtime.       . traZODone (DESYREL) 50 MG tablet Take 50 mg by mouth at bedtime.        . vitamin C (ASCORBIC ACID) 500 MG tablet Take 500 mg by mouth daily.          Allergies  Lopressor and Sodium pantothenate  Electrocardiogram:  Assessment and Plan

## 2011-02-01 NOTE — Assessment & Plan Note (Signed)
Cholesterol is at goal.  Continue current dose of statin and diet Rx.  No myalgias or side effects.  F/U  LFT's in 6 months. No results found for this basename: LDLCALC             

## 2011-02-01 NOTE — Assessment & Plan Note (Signed)
Only two short lived episodes in the last two months.  She has always tolerated meds poorly.  Sounds like the bystolic was the main culprit although the visual issues may have been from flecainide.  Will have her see Dr Graciela Husbands to see if it is worthwhile to try alternative meds.  Despite right bruit recent myovue nonischemic with normal EF and no LVH

## 2011-02-01 NOTE — Patient Instructions (Signed)
Your physician recommends that you schedule a follow-up appointment in: ASAP WITH DR Graciela Husbands  FOR PAF  INTOLERANT  TO [facility]  Your physician wants you to follow-up in: 6 MONTHS  WITH DR Haywood Filler will receive a reminder letter in the mail two months in advance. If you don't receive a letter, please call our office to schedule the follow-up appointment. Your physician recommends that you continue on your current medications as directed. Please refer to the Current Medication list given to you today.

## 2011-03-02 ENCOUNTER — Ambulatory Visit (INDEPENDENT_AMBULATORY_CARE_PROVIDER_SITE_OTHER): Payer: Medicare Other | Admitting: Internal Medicine

## 2011-03-02 ENCOUNTER — Encounter: Payer: Self-pay | Admitting: Internal Medicine

## 2011-03-02 VITALS — BP 105/75 | HR 65 | Ht 64.0 in | Wt 162.0 lb

## 2011-03-02 DIAGNOSIS — I480 Paroxysmal atrial fibrillation: Secondary | ICD-10-CM

## 2011-03-02 DIAGNOSIS — I4891 Unspecified atrial fibrillation: Secondary | ICD-10-CM

## 2011-03-02 MED ORDER — FLECAINIDE ACETATE 100 MG PO TABS: ORAL_TABLET | ORAL | Status: DC

## 2011-03-02 MED ORDER — RIVAROXABAN 20 MG PO TABS: 20.0000 mg | ORAL_TABLET | Freq: Every day | ORAL | Status: DC

## 2011-03-02 NOTE — Progress Notes (Signed)
CARDIOLOGY CONSULT NOTE  Patient ID: Colleen Galloway, MRN: [id number], DOB/AGE: 1934-03-30 77 y.o. Admit date: (Not on file) Date of Consult: 03/02/2011  Primary Physician: Paulina Fusi, MD, MD Primary Cardiologist: PN Chief Complaint: Afib   HPI: The patient is a 77 year old woman referred for consideration of therapy for atrial fibrillation.  She says that these have been going on for about a year. She was apparently seen in the emergency room in [place] in August. No records are available. She then underwent an event loop recording which apparently shows atrial fibrillation although I have not been able to find these data. Because of recurrences, she was started on flecainide and bystolic the combination of which were not tolerated. She is also previously not tolerated metoprolol tartrate.  Her paroxysms of this arrhythmia are relatively brief period the last 3 or so hours or so and are associated with mostly just palpitations. Specifically she denies lightheadedness shortness of breath fatigue or chest pain.  She is otherwise quite fit. Echocardiographic evaluation demonstrated normal left ventricular function and normal left atrial dimension. There is moderate TR  Thromboembolic risk factors are notable for age, gender, vascular disease. She is currently taking aspirin. She denies sleep apnea symptoms    Past Medical History  Diagnosis Date  . Coronary artery disease   . Lumbosacral spondylosis without myelopathy   . Calculus of kidney   . Other and unspecified hyperlipidemia   . Other and unspecified hyperlipidemia   . Insomnia, unspecified   . Esophageal reflux   . Fibrocystic disease of breast   . Osteoarthrosis, unspecified whether generalized or localized, unspecified site   . Kidney stone   . Urinary tract infection   . Disorder of bone and cartilage, unspecified   . Arthritis   . Unspecified sinusitis (chronic)       Surgical History:  Past Surgical History    Procedure Date  . Dilation and curettage of uterus 1960     Home Meds: Prior to Admission medications   Medication Sig Start Date End Date Taking? Authorizing Provider  aspirin 325 MG EC tablet Take 325 mg by mouth daily.     Yes Historical Provider, MD  calcium citrate-vitamin D (CITRACAL+D) 315-200 MG-UNIT per tablet Take 1 tablet by mouth 2 (two) times daily.     Yes Historical Provider, MD  doxycycline (ADOXA) 50 MG tablet Take 50 mg by mouth as needed.    Yes Historical Provider, MD  estradiol (ESTRACE) 0.1 MG/GM vaginal cream Place 2 g vaginally daily.     Yes Historical Provider, MD  fish oil-omega-3 fatty acids 1000 MG capsule Take 2 g by mouth daily.     Yes Historical Provider, MD  Multiple Vitamin (MULTIVITAMIN) capsule Take 1 capsule by mouth daily.     Yes Historical Provider, MD  omeprazole (PRILOSEC) 20 MG capsule Take 20 mg by mouth daily.     Yes Historical Provider, MD  simvastatin (ZOCOR) 40 MG tablet Take 40 mg by mouth at bedtime.    Yes Historical Provider, MD  traZODone (DESYREL) 50 MG tablet Take 50 mg by mouth at bedtime.     Yes Historical Provider, MD  vitamin C (ASCORBIC ACID) 500 MG tablet Take 500 mg by mouth daily.     Yes Historical Provider, MD    Allergies:  Allergies  Allergen Reactions  . Flecainide Other (See Comments)    Sob, visual problems, swelling ankles  . Lopressor (Metoprolol Tartrate)     Severe dizziness  .  Nebivolol Swelling    Sob, visual problems, swelling in ankles  . Sodium Pantothenate     History   Social History  . Marital Status: Married    Spouse Name: N/A    Number of Children: N/A  . Years of Education: N/A   Occupational History  . Not on file.   Social History Main Topics  . Smoking status: Never Smoker   . Smokeless tobacco: Not on file  . Alcohol Use: No  . Drug Use:   . Sexually Active:    Other Topics Concern  . Not on file   Social History Narrative   Non smoker/ no tobacco useCurrent  work/retiredMartial status/widowedNon drinker/ no alcohol useSeat belt use/ always wears seatbeltCaffeine useGuns in the home   she is widowed  Family History  Problem Relation Age of Onset  . Heart disease Mother   . Asthma Mother   . Hypertension Mother   . Cancer Mother   . Heart disease Father   . Hypertension Father   . Stroke Father   . Heart disease Sister   . Hypertension Sister   . Heart disease Brother   . Asthma Brother   . Hypertension Brother   . Heart disease Other   . Hypertension Other   . Colon cancer Other      Review of Systems:  General: negative for chills, fever, night sweats or weight changes.  Cardiovascular: negative for chest pain, edema, orthopnea, palpitations, paroxysmal nocturnal dyspnea, shortness of breath or dyspnea on exertion Dermatological: negative for rash Respiratory: negative for cough or wheezing Urologic: negative for hematuria Abdominal: negative for nausea, vomiting, diarrhea, bright red blood per rectum, melena, or hematemesis Neurologic: negative for visual changes, syncope, or dizziness All other systems reviewed and are otherwise negative except as noted above.          Physical Exam: Blood pressure 105/75, pulse 65, height 5\' 4"  (1.626 m), weight 162 lb (73.483 kg). General: Well developed, well nourished, in no acute distress. Head: Normocephalic, atraumatic, sclera non-icteric, no xanthomas, nares are without discharge. Lymph Nodes:  none Neck: Negative for carotid bruits. JVD not elevated. Lungs: Clear bilaterally to auscultation without wheezes, rales, or rhonchi. Breathing is unlabored. Heart: RRR with S1 S2. No murmurs, rubs, or gallops appreciated. Abdomen: Soft, non-tender, non-distended with normoactive bowel sounds. No hepatomegaly. No rebound/guarding. No obvious abdominal masses. Msk:  Strength and tone appear normal for age. Extremities: No clubbing or cyanosis. No edema.  Distal pedal pulses are 2+ and  equal bilaterally. Skin: Warm and Dry Neuro: Alert and oriented X 3. CN III-XII intact Grossly normal sensory and motor function . Psych:  Responds to questions appropriately with a normal affect.       EKG: Sinus rhythm. Rate 65 Intervals 0.15/0.07/0.40 Axis is -18 Nonspecific T wave flattening

## 2011-03-02 NOTE — Patient Instructions (Addendum)
Your physician has recommended you make the following change in your medication:  1) take flecainide 100 mg x 1 dose as needed for episodes of atrial fib lasting 1 hour or greater. 2) Stop Aspirin. 3) Start Xarelto 20 mg one tablet by mouth once daily.  Your physician recommends that you have lab work today: bmp  Your physician wants you to follow-up in: 4-5 months with Dr. Graciela Husbands. You will receive a reminder letter in the mail two months in advance. If you don't receive a letter, please call our office to schedule the follow-up appointment.

## 2011-03-02 NOTE — Assessment & Plan Note (Addendum)
The patient apparently has paroxysmal atrial fibrillation. We await the documentation from the loop recorder. Her thromboembolic risk profile is sufficiently high that I think aspirin is an adequate though I have recommended that we change. We will start Rivaroxaban as she has a history of GE reflux and she is wearing of the legal advertises on television.  Given the relative infrequency of her episodes, I suggest that we try to use when necessary flecainide restore sinus rhythm. Hopefully this will minimize side effects.  We don't have evidence of creatinine so we'll check it today prior to the initiation of her anticoagulation

## 2011-03-03 LAB — BASIC METABOLIC PANEL
BUN: 19 mg/dL (ref 6–23)
CO2: 25 mEq/L (ref 19–32)
Calcium: 9.8 mg/dL (ref 8.4–10.5)
GFR: 59.62 mL/min — ABNORMAL LOW (ref >60.00)
Glucose, Bld: 92 mg/dL (ref 70–99)

## 2011-03-09 ENCOUNTER — Telehealth: Payer: Self-pay | Admitting: Internal Medicine

## 2011-03-09 NOTE — Telephone Encounter (Signed)
Fu call °Patient returning your call °

## 2011-03-09 NOTE — Telephone Encounter (Signed)
I spoke with the patient. 

## 2011-03-28 ENCOUNTER — Telehealth: Payer: Self-pay | Admitting: Internal Medicine

## 2011-03-28 DIAGNOSIS — I4891 Unspecified atrial fibrillation: Secondary | ICD-10-CM

## 2011-03-28 NOTE — Telephone Encounter (Signed)
New Problem   Patient having Atril Fib spells and dizziness, thinks it may be attributed to the [employer].  Please  Would like a return call at hm#.     (No SOB, but it feels like the blood rushes to her head when she bends over)

## 2011-03-28 NOTE — Telephone Encounter (Signed)
I spoke with the patient. She states that she had an episode of a-fib on 1/27 and again on 2/8 that lasted more than an hour and she took flecainide. This past Friday, she was fine in the morning. She then went out and did some yardwork. Friday night she had an episode of a-fib that lasted longer than an hour and she took a dose of flecainide. She states this persisted for a while, but she felt ok when she woke up Saturday morning. She complains of dizziness at night, but she does not check her BP or pulse at home. She thinks that when she is out of rhythm, her rates are fast. She has been on xarelto at night and doesn't know if this is contributing to her symptoms. I explained that it will not contribute to a-fib. She reports a small amount of blood tinged mucus when blowing her nose. I explained I will review with Dr. Graciela Husbands and call her back. She is agreeable.

## 2011-03-29 NOTE — Telephone Encounter (Signed)
i  Agree that  Rivaroxaban is unlikely contributing to  Af  If the frequency of her afib is bothersome then we try a low dose of cardizam maybe 120 as her HR was only 65 and see if that doesn't help with her symptoms  Thanks steve

## 2011-03-30 MED ORDER — DILTIAZEM HCL ER COATED BEADS 120 MG PO CP24: 120.0000 mg | ORAL_CAPSULE | Freq: Every day | ORAL | Status: DC

## 2011-03-30 NOTE — Telephone Encounter (Signed)
I spoke with the patient. She is aware of Dr. Odessa Fleming recommendations. She will try cardizem 120 mg once daily and then let us know how she is feeling, if feeling worse.

## 2011-05-09 ENCOUNTER — Telehealth: Payer: Self-pay | Admitting: *Deleted

## 2011-05-09 DIAGNOSIS — I4891 Unspecified atrial fibrillation: Secondary | ICD-10-CM

## 2011-05-09 NOTE — Telephone Encounter (Signed)
I spoke with the patient. She reports that she had an "episode" Friday night and in to Saturday. She couldn't sleep and felt restless. At 8:30 am she was 134/103 HR -134, 8:58 am- 125/105 Hr- 120, at 11am- 121/69 HR- 98 (regular), 9 pm- 131/67 HR- 78 (regular). She had another similar episode on 3/3. I explained Dr. Odessa Fleming recommendations to the patient. She is agreeable with the plan. She will follow up with Dr. Laruth Bouchard tomorrow to see if he can evaluate her for her hemrrhoids. She will call back after this evaluation and keep a record of her HR/ BP.

## 2011-05-09 NOTE — Telephone Encounter (Signed)
Call from the nurse at Dr. Rolly Salter office. The patient is there today for a GYN physical to be done. She reports to them that her BP has been elevated and she has noticed some irregular heart beats at home. She has also noticed some nose bleeds and rectal bleeding. Per the nurse, the patient's BP today was 146/76 HR- 64. I inquired about her home readings and she said there was documentation of HR's in the 130's and DBP readings in the 100's. The patient last saw Dr. Graciela Husbands in January was started on Xarelto for a-fib. She called back in February with reports of increasing episodes of a-fib. She was started on diltiazem 120 mg once daily. I explained to the nurse with Dr. Thamas Jaegers that I would review with Dr. Graciela Husbands and call the patient at home later today. She will notify the patient.

## 2011-05-09 NOTE — Telephone Encounter (Signed)
Let us increase the diltiazem to 120 twice a day thank you

## 2011-05-09 NOTE — Telephone Encounter (Signed)
With rectal bleeding and epistaxis, she should see her PCP for eval and referral and we should hold the  Rivaroxaban until that has been evaluated and cleared

## 2011-05-09 NOTE — Telephone Encounter (Signed)
Addended by: Jefferey Pica on: 05/09/2011 06:34 PM   Modules accepted: Orders

## 2011-05-09 NOTE — Telephone Encounter (Signed)
h, let us make sure she has followup in May thanks

## 2011-05-11 ENCOUNTER — Telehealth: Payer: Self-pay | Admitting: Internal Medicine

## 2011-05-11 NOTE — Telephone Encounter (Signed)
Pt saw a dr today and was to call today to let us know how it went, pls call

## 2011-05-11 NOTE — Telephone Encounter (Signed)
Colleen Galloway from Dr.Misenheimer's office called back. He felt that she could restart the Xarelto because anal fissure was small. Called patient and she is aware of above.

## 2011-05-11 NOTE — Telephone Encounter (Signed)
FU Call: Pt returning call to Jackie. Please return pt call to discuss further.  

## 2011-05-11 NOTE — Telephone Encounter (Signed)
Called patient back. She wanted to let  Dr.Klein know that she was examined by Dr.Misenheimer yesterday and he advised her that she has an anal fissure and he prescribed a rectal suppository, rectal cream,sitz bathes,baby wipes,and fibercon. He did not give her any information as to when she should restart xarelto. Advised her to find out from Dr. Jennye Boroughs when he recommends her to restart Xarelto. She will call us back with information.   I made her a follow up appointment to see Dr.Klein on 06/15/2011.

## 2011-05-17 ENCOUNTER — Telehealth: Payer: Self-pay | Admitting: Cardiovascular Disease

## 2011-05-17 NOTE — Telephone Encounter (Signed)
[facility] needs call [phone] or fax [phone] for correct dosage of diltiazem

## 2011-05-18 NOTE — Telephone Encounter (Signed)
Called into pharmacy med was increased per Dr. Juliann Pares, MD 05/09/2011 5:31 PM Signed  Let us increase the diltiazem to 120 twice a day

## 2011-05-24 ENCOUNTER — Encounter: Payer: Self-pay | Admitting: Vascular Surgery

## 2011-05-24 ENCOUNTER — Telehealth: Payer: Self-pay | Admitting: Internal Medicine

## 2011-05-24 NOTE — Telephone Encounter (Signed)
It is possible that the dilt is causing the edema  If she thinks so we should change the dilt to a betablocker thnaks

## 2011-05-24 NOTE — Telephone Encounter (Signed)
New msg Pt said she will be in [place] tomorrow and wants to know if she can get samples of xeralto. Pt said she would drop off her BP readings too. She said her ankles have been swelling. Please call to discuss.

## 2011-05-24 NOTE — Telephone Encounter (Signed)
I spoke with the patient and made her aware I do not currently have any samples of xarelto in the office, but I will look for some and pull these for her for when she comes in to see Dr. Graciela Husbands on 5/1. She also wanted to let us know she is having some edema in her ankles. She has been started on diltiazem 120 mg BID for her a-fib and has been doing this since around 05/09/11. She states her edema is improved with elevation. She will bring by her BP/HR readings when she is in [facility] tomorrow. I explained I would forward this to Dr. Graciela Husbands for review.

## 2011-05-25 ENCOUNTER — Encounter: Payer: Self-pay | Admitting: Neurosurgery

## 2011-05-25 ENCOUNTER — Telehealth: Payer: Self-pay | Admitting: Internal Medicine

## 2011-05-25 ENCOUNTER — Ambulatory Visit (INDEPENDENT_AMBULATORY_CARE_PROVIDER_SITE_OTHER): Payer: Medicare Other | Admitting: *Deleted

## 2011-05-25 ENCOUNTER — Ambulatory Visit (INDEPENDENT_AMBULATORY_CARE_PROVIDER_SITE_OTHER): Payer: Medicare Other | Admitting: Neurosurgery

## 2011-05-25 VITALS — BP 126/72 | HR 64 | Resp 16 | Ht 64.0 in | Wt 167.7 lb

## 2011-05-25 DIAGNOSIS — I6529 Occlusion and stenosis of unspecified carotid artery: Secondary | ICD-10-CM

## 2011-05-25 DIAGNOSIS — I4891 Unspecified atrial fibrillation: Secondary | ICD-10-CM

## 2011-05-25 NOTE — Telephone Encounter (Signed)
Walk in pt Form " Pt Dropped Off BP Readings" sent to Message Nurse 05/25/11/Km

## 2011-05-25 NOTE — Progress Notes (Signed)
[facility] [place] HISTORY AND PHYSICAL   CC: Annual carotid duplex exam Referring Physician: Edilia Bo  History of Present Illness: 77 year old female patient of Dr. Edilia Bo seen for her annual carotid duplex exam, the patient reports no signs of amaurosis fugax, CVA, TIA, diplopia, dysphagia. She's had no new medical problems develop and has had no new surgery.  Past Medical History  Diagnosis Date  . Atrial fibrillation   . Lumbosacral spondylosis without myelopathy   . Carotid artery disease     Followed by Dr. Edilia Bo  . Insomnia, unspecified   . Esophageal reflux   . Fibrocystic disease of breast   . Osteoarthrosis, unspecified whether generalized or localized, unspecified site   . Urinary tract infection   . Arthritis   . Unspecified sinusitis (chronic)   . Calculus of kidney     kidney stones    ROS: [x]  Positive   [ ]  Denies    General: [ ]  Weight loss, [ ]  Fever, [ ]  chills Neurologic: [ ]  Dizziness, [ ]  Blackouts, [ ]  Seizure [ ]  Stroke, [ ]  "Mini stroke", [ ]  Slurred speech, [ ]  Temporary blindness; [ ]  weakness in arms or legs, [ ]  Hoarseness Cardiac: [ ]  Chest pain/pressure, [ ]  Shortness of breath at rest [ ]  Shortness of breath with exertion, [ ]  Atrial fibrillation or irregular heartbeat Vascular: [ ]  Pain in legs with walking, [ ]  Pain in legs at rest, [ ]  Pain in legs at night,  [ ]  Non-healing ulcer, [ ]  Blood clot in vein/DVT,   Pulmonary: [ ]  Home oxygen, [ ]  Productive cough, [ ]  Coughing up blood, [ ]  Asthma,  [ ]  Wheezing Musculoskeletal:  [ ]  Arthritis, [ ]  Low back pain, [ ]  Joint pain Hematologic: [ ]  Easy Bruising, [ ]  Anemia; [ ]  Hepatitis Gastrointestinal: [ ]  Blood in stool, [ ]  Gastroesophageal Reflux/heartburn, [ ]  Trouble swallowing Urinary: [ ]  chronic Kidney disease, [ ]  on HD - [ ]  MWF or [ ]  TTHS, [ ]  Burning with urination, [ ]  Difficulty urinating Skin: [ ]  Rashes, [ ]  Wounds Psychological: [ ]  Anxiety, [ ]   Depression   Social History History  Substance Use Topics  . Smoking status: Never Smoker   . Smokeless tobacco: Not on file  . Alcohol Use: No    Family History Family History  Problem Relation Age of Onset  . Heart disease Mother   . Asthma Mother   . Hypertension Mother   . Cancer Mother   . Deep vein thrombosis Mother   . Heart disease Father   . Hypertension Father   . Stroke Father   . Heart disease Sister   . Hypertension Sister   . Hyperlipidemia Sister   . Heart disease Brother   . Asthma Brother   . Hypertension Brother   . Deep vein thrombosis Brother   . Diabetes Brother   . Heart disease Other   . Hypertension Other   . Colon cancer Other     Allergies  Allergen Reactions  . Flecainide Other (See Comments)    Sob, visual problems, swelling ankles  . Lopressor (Metoprolol Tartrate)     Severe dizziness  . Nebivolol Swelling    Sob, visual problems, swelling in ankles  . Sodium Pantothenate     Current Outpatient Prescriptions  Medication Sig Dispense Refill  . calcium citrate-vitamin D (CITRACAL+D) 315-200 MG-UNIT per tablet Take 1 tablet by mouth 2 (two) times daily.        Marland Kitchen  diltiazem (CARDIZEM CD) 120 MG 24 hr capsule Take one capsule by mouth twice daily      . doxycycline (ADOXA) 50 MG tablet Take 50 mg by mouth as needed.       . doxycycline (VIBRAMYCIN) 50 MG capsule       . estradiol (ESTRACE) 0.1 MG/GM vaginal cream Place 2 g vaginally daily.        . fish oil-omega-3 fatty acids 1000 MG capsule Take 2 g by mouth daily.        . flecainide (TAMBOCOR) 100 MG tablet Take one tablet by mouth x one dose as needed for episodes of atrial fib lasting 1 hour or longer  30 tablet  2  . meloxicam (MOBIC) 15 MG tablet       . Multiple Vitamin (MULTIVITAMIN) capsule Take 1 capsule by mouth daily.        Marland Kitchen omeprazole (PRILOSEC) 20 MG capsule Take 20 mg by mouth daily.        . Rivaroxaban 20 MG TABS Take 20 mg by mouth daily.  30 tablet  11  .  simvastatin (ZOCOR) 40 MG tablet Take 40 mg by mouth at bedtime.       . traZODone (DESYREL) 50 MG tablet Take 50 mg by mouth at bedtime.        . vitamin C (ASCORBIC ACID) 500 MG tablet Take 500 mg by mouth daily.          Physical Examination  Filed Vitals:   05/25/11 1425  BP: 126/72  Pulse: 64  Resp: 16    Body mass index is 28.79 kg/(m^2).  General:  WDWN in NAD Gait: Normal HEENT: WNL Eyes: Pupils equal Pulmonary: normal non-labored breathing , without Rales, rhonchi,  wheezing Cardiac: RRR, without  Murmurs, rubs or gallops; Abdomen: soft, NT, no masses Skin: no rashes, ulcers noted  Vascular Exam Pulses: 2+ radial pulses bilaterally DP and PT are 1-2 bilaterally Carotid bruits: mild right carotid bruit to auscultation, normal pulse on the left. Extremities without ischemic changes, no Gangrene , no cellulitis; no open wounds;  Musculoskeletal: no muscle wasting or atrophy   Neurologic: A&O X 3; Appropriate Affect ; SENSATION: normal; MOTOR FUNCTION:  moving all extremities equally. Speech is fluent/normal  Non-Invasive Vascular Imaging CAROTID DUPLEX 05/25/2011  Right ICA 60 - 79 % stenosis Left ICA 20 - 39 % stenosis Prior duplex one year ago place the patient at the 40-50 function on the right nothing significant left  ASSESSMENT/PLAN: Patient with fluctuating right ICA stenosis over the last 2 years stable on the left. We will bring the patient back in 6 months for repeat carotid duplex exam her questions were encouraged and answered she is in agreement with this  Lauree Chandler ANP   Clinic MD: Edilia Bo

## 2011-05-26 NOTE — Progress Notes (Signed)
Addended by: Sharee Pimple on: 05/26/2011 03:23 PM   Modules accepted: Orders

## 2011-05-27 MED ORDER — METOPROLOL SUCCINATE ER 50 MG PO TB24: 50.0000 mg | ORAL_TABLET | Freq: Every day | ORAL | Status: DC

## 2011-05-27 NOTE — Telephone Encounter (Signed)
Addended by: Sherri Rad C on: 05/27/2011 06:29 PM   Modules accepted: Orders

## 2011-05-27 NOTE — Telephone Encounter (Signed)
Spoke with pt re edema; stop dilt give metoprolol 50 mg daily  And will see as scheuled

## 2011-06-01 NOTE — Procedures (Unsigned)
CAROTID DUPLEX EXAM  INDICATION:  Follow up carotid disease.  HISTORY: hypercholesterolemia. Diabetes:  No. Cardiac:  No. Hypertension:  No. Smoking:  No. Previous Surgery:  No. CV History: Amaurosis Fugax No, Paresthesias No, hemiparesis No.                                      RIGHT             LEFT Brachial systolic pressure:         124               124 Brachial Doppler waveforms:         WNL               WNL Vertebral direction of flow:        Antegrade         Antegrade DUPLEX VELOCITIES (cm/sec) CCA peak systolic                   244/69            101 ECA peak systolic                   249               56 ICA peak systolic                   153               64 ICA end diastolic                   39                23 PLAQUE MORPHOLOGY:                  Heterogenous      Heterogenous PLAQUE AMOUNT:                      Moderate          Minimal PLAQUE LOCATION:                    Bifurcation       ICA/ECA  IMPRESSION: 1. 60% to 79% right carotid bifurcation stenosis.  Velocities in the     internal carotid artery are elevated with minimal extension of     disease into the vessel. 2. Minimal plaquing of the left carotid system. 3. Bilateral vertebral arteries are within normal limits.  ___________________________________________ Di Kindle. Edilia Bo, M.D.  LT/MEDQ  D:  05/26/2011  T:  05/26/2011  Job:  [id number]

## 2011-06-03 ENCOUNTER — Encounter: Payer: Self-pay | Admitting: Internal Medicine

## 2011-06-08 ENCOUNTER — Telehealth: Payer: Self-pay | Admitting: Internal Medicine

## 2011-06-08 NOTE — Telephone Encounter (Signed)
FYI:  Patient called in to report the new RX metoprolol succinate (TOPROL-XL) 50 MG 24 hr tablet Seems to be working great.  She had 1 episode of irregular heart on 4/14.  Patient can be reached at hm# [phone] for additional information.

## 2011-06-08 NOTE — Telephone Encounter (Signed)
I spoke with the patient. Dr. Graciela Husbands had d/c'ed her diltiazem and started metoprolol around 4/13. He wanted her to call about a week before seeing him back to update [place] on how she is doing. She reports one episode of dizziness and irregular heart beat the day she started the metoprolol (around 4/13), but since that time, she states her HR and BP readings have been good and she feels good, just an occasional feeling of dizziness. She is scheduled to see Dr. Graciela Husbands back on 5/1. I will forward this to Dr. Graciela Husbands as an Lorain Childes. The patient will call back should she have any problems between now and then.

## 2011-06-15 ENCOUNTER — Ambulatory Visit (INDEPENDENT_AMBULATORY_CARE_PROVIDER_SITE_OTHER): Payer: Medicare Other | Admitting: Internal Medicine

## 2011-06-15 VITALS — BP 143/84 | HR 63 | Ht 64.0 in | Wt 169.8 lb

## 2011-06-15 DIAGNOSIS — I480 Paroxysmal atrial fibrillation: Secondary | ICD-10-CM

## 2011-06-15 DIAGNOSIS — I4891 Unspecified atrial fibrillation: Secondary | ICD-10-CM

## 2011-06-15 NOTE — Patient Instructions (Signed)
Your physician recommends that you schedule a follow-up appointment in: 4 months with Dr Klein  

## 2011-06-15 NOTE — Progress Notes (Signed)
  HPI  Colleen Galloway is a 77 y.o. female  Seen in followup for paroxysmal atrial fibrillation. At her last visit she was started on anticoagulation for a CHADS-VASc score of 3  She developed peripheral edema on diltiazem. She is now on metoprolol succinate and tolerating it much better. She continues to have paroxysms of atrial fibrillation which are much more frequently identified by her monitor and they are symptomatically. Past Medical History  Diagnosis Date  . Atrial fibrillation   . Lumbosacral spondylosis without myelopathy   . Carotid artery disease     Followed by Dr. Edilia Bo  . Insomnia, unspecified   . Esophageal reflux   . Fibrocystic disease of breast   . Osteoarthrosis, unspecified whether generalized or localized, unspecified site   . Urinary tract infection   . Arthritis   . Unspecified sinusitis (chronic)   . Calculus of kidney     kidney stones    Past Surgical History  Procedure Date  . Dilation and curettage of uterus 1960    Current Outpatient Prescriptions  Medication Sig Dispense Refill  . calcium citrate-vitamin D (CITRACAL+D) 315-200 MG-UNIT per tablet Take 1 tablet by mouth 2 (two) times daily.        Marland Kitchen doxycycline (ADOXA) 50 MG tablet Take 50 mg by mouth as needed.       Marland Kitchen estradiol (ESTRACE) 0.1 MG/GM vaginal cream Place 2 g vaginally daily.        . fish oil-omega-3 fatty acids 1000 MG capsule Take 2 g by mouth daily.        . metoprolol succinate (TOPROL-XL) 50 MG 24 hr tablet Take 1 tablet (50 mg total) by mouth daily. Take with or immediately following a meal.  30 tablet  6  . Multiple Vitamin (MULTIVITAMIN) capsule Take 1 capsule by mouth daily.        Marland Kitchen omeprazole (PRILOSEC) 20 MG capsule Take 20 mg by mouth daily.        . Rivaroxaban 20 MG TABS Take 20 mg by mouth daily.  30 tablet  11  . simvastatin (ZOCOR) 40 MG tablet Take 40 mg by mouth at bedtime.       . traZODone (DESYREL) 50 MG tablet Take 50 mg by mouth at bedtime.        .  vitamin C (ASCORBIC ACID) 500 MG tablet Take 500 mg by mouth daily.          Allergies  Allergen Reactions  . Flecainide Other (See Comments)    Sob, visual problems, swelling ankles  . Lopressor (Metoprolol Tartrate)     Severe dizziness  . Nebivolol Swelling    Sob, visual problems, swelling in ankles  . Sodium Pantothenate     Review of Systems negative except from HPI and PMH  Physical Exam BP 143/84  Pulse 63  Ht 5\' 4"  (1.626 m)  Wt 169 lb 12.8 oz (77.021 kg)  BMI 29.15 kg/m2 Well developed and well nourished in no acute distress HENT normal E scleral and icterus clear Neck Supple JVP flat; carotids brisk and full Clear to ausculation  Regular rate and rhythm, no murmurs gallops or rub Soft with active bowel sounds No clubbing cyanosis none Edema Alert and oriented, grossly normal motor and sensory function Skin Warm and Dry  Sinus rhythm 77 year old 15/06/42 Nonspecific T wave changes  Assessment and  Plan

## 2011-06-15 NOTE — Assessment & Plan Note (Signed)
He continues to have paroxysms of atrial fibrillation. She is tolerating her Rivaroxaban and we will continue it. She is reflecting it on an as-needed basis. We will see her in 4 months. We'll continue her metoprolol succinate

## 2011-06-22 NOTE — Progress Notes (Signed)
Addended by: Vista Mink D on: 06/22/2011 10:29 AM   Modules accepted: Orders

## 2011-09-08 ENCOUNTER — Encounter: Payer: Self-pay | Admitting: Internal Medicine

## 2011-09-12 ENCOUNTER — Telehealth: Payer: Self-pay | Admitting: Internal Medicine

## 2011-09-12 ENCOUNTER — Other Ambulatory Visit: Payer: Self-pay | Admitting: Internal Medicine

## 2011-09-12 NOTE — Telephone Encounter (Signed)
Forward 2 pages to Dr. Sherryl Manges for review on 09-12-11 ym

## 2011-10-20 ENCOUNTER — Ambulatory Visit (INDEPENDENT_AMBULATORY_CARE_PROVIDER_SITE_OTHER): Payer: Medicare Other | Admitting: Internal Medicine

## 2011-10-20 ENCOUNTER — Encounter: Payer: Self-pay | Admitting: Internal Medicine

## 2011-10-20 VITALS — BP 164/72 | Ht 64.0 in | Wt 169.8 lb

## 2011-10-20 DIAGNOSIS — R42 Dizziness and giddiness: Secondary | ICD-10-CM

## 2011-10-20 DIAGNOSIS — I5032 Chronic diastolic (congestive) heart failure: Secondary | ICD-10-CM

## 2011-10-20 DIAGNOSIS — I10 Essential (primary) hypertension: Secondary | ICD-10-CM

## 2011-10-20 DIAGNOSIS — I4891 Unspecified atrial fibrillation: Secondary | ICD-10-CM

## 2011-10-20 HISTORY — DX: Dizziness and giddiness: R42

## 2011-10-20 HISTORY — DX: Chronic diastolic (congestive) heart failure: I50.32

## 2011-10-20 MED ORDER — METOPROLOL SUCCINATE ER 100 MG PO TB24: 100.0000 mg | ORAL_TABLET | Freq: Every day | ORAL | Status: DC

## 2011-10-20 MED ORDER — CHLORTHALIDONE 25 MG PO TABS: 25.0000 mg | ORAL_TABLET | Freq: Every day | ORAL | Status: DC

## 2011-10-20 NOTE — Assessment & Plan Note (Signed)
She continues with paroxysms of atrial fibrillation. We will have her use extra metoprolol on an as-needed basis. We will defer the use of antiarrhythmic drugs

## 2011-10-20 NOTE — Patient Instructions (Signed)
Your physician has recommended you make the following change in your medication:  1) Increase metoprolol succinate to 100 mg one tablet by mouth daily. 2) Start chlorthalidone 25 mg one tablet by mouth daily.  Your physician wants you to follow-up in: 6 months with Dr. Graciela Husbands. You will receive a reminder letter in the mail two months in advance. If you don't receive a letter, please call our office to schedule the follow-up appointment.

## 2011-10-20 NOTE — Progress Notes (Signed)
Patient Care Team: Paulina Fusi as PCP - General (Internal Medicine)   HPI  Colleen Galloway is a 77 y.o. female Seen in followup for paroxysmal atrial fibrillation. She is currently anticoagulated. She had problems with edema on diltiazem and currently on metoprolol succinate.  She has has some peripheral edema with prolonged standing. She has some nocturnal dyspnea and shortness of breath.  She complains of lightheadedness particularly upon standing. It is self resolving.  She has also had recurrent episodes of the atrial fibrillation, which are quite discombobulating.  She has episodes 2-3 times per month. They're associated with rapid rates in the 110-130 range. He is also recorder her blood pressures mostly: 140 range.  Past Medical History  Diagnosis Date  . Atrial fibrillation   . Lumbosacral spondylosis without myelopathy   . Carotid artery disease     Followed by Dr. Edilia Bo  . Insomnia, unspecified   . Esophageal reflux   . Fibrocystic disease of breast   . Osteoarthrosis, unspecified whether generalized or localized, unspecified site   . Urinary tract infection   . Arthritis   . Unspecified sinusitis (chronic)   . Calculus of kidney     kidney stones    Past Surgical History  Procedure Date  . Dilation and curettage of uterus 1960    Current Outpatient Prescriptions  Medication Sig Dispense Refill  . calcium citrate-vitamin D (CITRACAL+D) 315-200 MG-UNIT per tablet Take 1 tablet by mouth 2 (two) times daily.        Marland Kitchen doxycycline (ADOXA) 50 MG tablet Take 50 mg by mouth as needed.       Marland Kitchen estradiol (ESTRACE) 0.1 MG/GM vaginal cream Place 2 g vaginally daily.        . fish oil-omega-3 fatty acids 1000 MG capsule Take 2 g by mouth daily.        . meloxicam (MOBIC) 15 MG tablet Take 15 mg by mouth as needed.      . metoprolol succinate (TOPROL-XL) 50 MG 24 hr tablet Take 1 tablet (50 mg total) by mouth daily. Take with or immediately following a meal.  30 tablet   6  . Multiple Vitamin (MULTIVITAMIN) capsule Take 1 capsule by mouth daily.        Marland Kitchen omeprazole (PRILOSEC) 20 MG capsule Take 20 mg by mouth daily.        . Rivaroxaban 20 MG TABS Take 20 mg by mouth daily.  30 tablet  11  . simvastatin (ZOCOR) 40 MG tablet Take 40 mg by mouth at bedtime.       . traZODone (DESYREL) 50 MG tablet Take 50 mg by mouth at bedtime.        . vitamin C (ASCORBIC ACID) 500 MG tablet Take 500 mg by mouth daily.          Allergies  Allergen Reactions  . Flecainide Other (See Comments)    Sob, visual problems, swelling ankles  . Lopressor (Metoprolol Tartrate)     Severe dizziness  . Nebivolol Swelling    Sob, visual problems, swelling in ankles  . Sodium Pantothenate     Review of Systems negative except from HPI and PMH  Physical Exam BP 164/72  Ht 5\' 4"  (1.626 m)  Wt 169 lb 12.8 oz (77.021 kg)  BMI 29.15 kg/m2 Well developed and well nourished in no acute distress HENT normal E scleral and icterus clear Neck Supple JVP  7-8 cm; carotids brisk and full Clear to ausculation  Regular rate  and rhythm there is an S4 and an early systolic murmur ds No clubbing cyanosis Trace Edema Alert and oriented, grossly normal motor and sensory function Skin Warm and Dry    electrocardiogram demonstrates sinus rhythm at 66 Intervals 15/06/39 Nonspecific ST-T changes   Assessment and  Plan

## 2011-10-20 NOTE — Assessment & Plan Note (Signed)
She has symptoms consistent with orthostatic hypotension. We have discussed the physiology and the role of isometric maneuvers to try to mitigate the symptoms. We have also discussed the potential aggravating effect of the diuretic. We will hold off on the use of Mestinon currently

## 2011-10-20 NOTE — Assessment & Plan Note (Signed)
There is evidence of volume overload of breath. Setting of normal left ventricular function as to the diagnosis of diastolic heart failure. Begin her on low-dose diuretic. We have discussed the potential adverse impact it could have on her orthostatic lightheadedness. She is currently wear her sodium intake into her diet

## 2011-11-23 ENCOUNTER — Other Ambulatory Visit: Payer: Medicare Other

## 2011-11-23 ENCOUNTER — Ambulatory Visit: Payer: Medicare Other | Admitting: Neurosurgery

## 2011-12-19 ENCOUNTER — Encounter: Payer: Self-pay | Admitting: Internal Medicine

## 2011-12-20 ENCOUNTER — Encounter: Payer: Self-pay | Admitting: Vascular Surgery

## 2011-12-21 ENCOUNTER — Ambulatory Visit (INDEPENDENT_AMBULATORY_CARE_PROVIDER_SITE_OTHER): Payer: Medicare Other | Admitting: Vascular Surgery

## 2011-12-21 ENCOUNTER — Other Ambulatory Visit (INDEPENDENT_AMBULATORY_CARE_PROVIDER_SITE_OTHER): Payer: Medicare Other | Admitting: *Deleted

## 2011-12-21 ENCOUNTER — Encounter: Payer: Self-pay | Admitting: Vascular Surgery

## 2011-12-21 VITALS — BP 133/74 | HR 89 | Resp 18 | Ht 64.0 in | Wt 168.0 lb

## 2011-12-21 DIAGNOSIS — I6529 Occlusion and stenosis of unspecified carotid artery: Secondary | ICD-10-CM

## 2011-12-21 NOTE — Progress Notes (Signed)
[facility] [place] HISTORY AND PHYSICAL   CC:  Follow up carotid duplex scan  Referring Provider:  Paulina Fusi, MD  HPI: This is a 77 y.o. female here for f/u carotid duplex scan.  Denies amaurosis fugax, paresthesias, or hemiparesis.  States when she bends over in the shower, she feels like she is "filling up" and describes it as fluid coming up through her throat.  She does not have dizzy spells during this time.  She states that she has been having a fast heart beat with documentation, but doesn't happen every day, but is late evening.  States that she has atrial fibrillation, but these fast beats have been regular per pt.  She is on Cardizem CD/metoprolol.  She last saw Dr. Clide Cliff back in April.  Past Medical History  Diagnosis Date  . Atrial fibrillation   . Lumbosacral spondylosis without myelopathy   . Carotid artery disease     Followed by Dr. Edilia Bo  . Insomnia, unspecified   . Esophageal reflux   . Fibrocystic disease of breast   . Osteoarthrosis, unspecified whether generalized or localized, unspecified site   . Urinary tract infection   . Arthritis   . Unspecified sinusitis (chronic)   . Calculus of kidney     kidney stones   Past Surgical History  Procedure Date  . Dilation and curettage of uterus 1960  . Kidney stone surgery   . Varicose veins     Allergies  Allergen Reactions  . Flecainide Other (See Comments)    Sob, visual problems, swelling ankles  . Lopressor (Metoprolol Tartrate)     Severe dizziness  . Nebivolol Swelling    Sob, visual problems, swelling in ankles  . Sodium Pantothenate     Current Outpatient Prescriptions  Medication Sig Dispense Refill  . calcium citrate-vitamin D (CITRACAL+D) 315-200 MG-UNIT per tablet Take 1 tablet by mouth 2 (two) times daily.        . chlorthalidone (HYGROTON) 25 MG tablet Take 1 tablet (25 mg total) by mouth daily.  30 tablet  6  . doxycycline (ADOXA) 50 MG tablet Take 50 mg by  mouth as needed.       Marland Kitchen estradiol (ESTRACE) 0.1 MG/GM vaginal cream Place 2 g vaginally daily.        . fish oil-omega-3 fatty acids 1000 MG capsule Take 2 g by mouth daily.        . meloxicam (MOBIC) 15 MG tablet Take 15 mg by mouth as needed.      . metoprolol succinate (TOPROL-XL) 100 MG 24 hr tablet Take 1 tablet (100 mg total) by mouth daily. Take with or immediately following a meal.  30 tablet  6  . Multiple Vitamin (MULTIVITAMIN) capsule Take 1 capsule by mouth daily.        Marland Kitchen omeprazole (PRILOSEC) 20 MG capsule Take 20 mg by mouth daily.        . Rivaroxaban 20 MG TABS Take 20 mg by mouth daily.  30 tablet  11  . simvastatin (ZOCOR) 40 MG tablet Take 40 mg by mouth at bedtime.       . traZODone (DESYREL) 50 MG tablet Take 50 mg by mouth at bedtime.        . vitamin C (ASCORBIC ACID) 500 MG tablet Take 500 mg by mouth daily.          Family History  Problem Relation Age of Onset  . Heart disease Mother   .  Asthma Mother   . Hypertension Mother   . Cancer Mother   . Deep vein thrombosis Mother   . Heart disease Father   . Hypertension Father   . Stroke Father   . Heart disease Sister   . Hypertension Sister   . Hyperlipidemia Sister   . Heart disease Brother   . Asthma Brother   . Hypertension Brother   . Deep vein thrombosis Brother   . Diabetes Brother   . Heart disease Other   . Hypertension Other   . Colon cancer Other     History   Social History  . Marital Status: Married    Spouse Name: N/A    Number of Children: N/A  . Years of Education: N/A   Occupational History  . Not on file.   Social History Main Topics  . Smoking status: Never Smoker   . Smokeless tobacco: Never Used  . Alcohol Use: No  . Drug Use: No  . Sexually Active: Not on file   Other Topics Concern  . Not on file   Social History Narrative   Non smoker/ no tobacco useCurrent work/retiredMartial status/widowedNon drinker/ no alcohol useSeat belt use/ always wears seatbeltCaffeine  useGuns in the home     ROS: [x]  Positive   [ ]  Negative   [ ]  All sytems reviewed and are negative  Cardiovascular: [] chest pain; [] chest pressure; [] palpitations; [] SOB lying flat; [] DOE; [] pain in legs with walking; [] pain in legs when lying flat; [] Hx of DVT; [] Hx phlebitis; [] swelling in legs; [] varicose veins  Pulmonary: [] productive cough; [] asthma; [] wheezing/ [x]  sinus problems Neurologic: [] Hx CVA; [] weakness in arms or legs; [] numbness in arms or legs; [] difficulty in speaking or slurred speech; [] temporary loss of vision in one eye; [x] dizziness Hematologic:  [] bleeding problems; [] clots easily GI:  [] vomiting blood; []  blood in stool; [] PUD; [x]  heartburn/indigestion; [x]  fatty liver GU: []  Dysuria; [] hematuria; [x]  frequency Psychiatric:  [] Hx major depression Integumentary:  [] rashes; [] ulcers Constitutional:  [] fever; [] chills   PHYSICAL EXAMINATION:  Filed Vitals:   12/21/11 1403  BP: 133/74  Pulse: 89  Resp:    Body mass index is 28.84 kg/(m^2).  General:  WDWN in NAD Gait: Normal HENT: WNL Eyes: PERRL Pulmonary: normal non-labored breathing , without Rales, rhonchi,  wheezing Cardiac: RRR, without  Murmurs, rubs or gallops; right carotid bruits Abdomen: soft, NT, no masses Skin: no rashes, ulcers noted Vascular Exam/Pulses: 2 + radial pulses bilaterally.  BLE warm; no edema Extremities: without ischemic changes, no Gangrene , no cellulitis; no open wounds;  Musculoskeletal: no muscle wasting or atrophy  Neurologic: A&O X 3; Appropriate Affect ; SENSATION: normal; MOTOR FUNCTION:  moving all extremities equally. Speech is fluent/normal  Non-Invasive Vascular Imaging: Carotid Duplex Scan:  12/21/2011    right left  Brachial systolic pressure 124 124  Brachial doppler waveforms WNL WNL  Vertebral direction of flow ANTEGRADE ANTEGRADE  DUPLEX VELOCITIES (cm/sec)    CCA peak systolic 244/69 101  ECA peak systolic 249 56  ICA peak systolic 153 64  ICA  end diastolic 39 23  Plaque morph Heterogenous hetergenous  Plaque amount moderate minimal  Plaque location bifurcation ICA/ECA   1.  60-79% Right carotid bifurcation stenosis.  Velocitites in the internal carotid artery are elevated with minimal extension of disease into vessel. 2. Minimal plaquing of the left carotid stenosis 3. Bilateral vertebral arteries are WNL.  ASSESSMENT/PLAN: 77 y.o. female here for f/u carotid duplex scan. She remains asymptomatic.  She has been having  tachycardia and has a hx or Atrial fibrillation.  She is on metoprolol/cardizem.  She does have dizziness on occasion.  F/u with Dr. Edilia Bo in 6 months with f/u carotid duplex May need to see Dr. Clide Cliff before her 6 month f/u to evaluate her fast heart rate.   Doreatha Massed, PA-C [facility] [phone]  Clinic MD:   Edilia Bo  This patient has an asymptomatic 60-79% right carotid stenosis. I have recommended follow carotid duplex scan in 6 months. We would only consider right carotid endarterectomy at the stenosis progressed to greater than 80% or she developed new right hemispheric symptoms. She has no significant carotid stenosis on the left.  She states that she has had some intermittent problems with tachycardia which sounds like he has been asymptomatic. She is followed by Dr. Sherryl Manges is scheduled to see him in the near future.  Di Kindle. Edilia Bo, MD, FACS Beeper [phone] 12/21/2011

## 2011-12-22 NOTE — Addendum Note (Signed)
Addended by: Sharee Pimple on: 12/22/2011 09:34 AM   Modules accepted: Orders

## 2011-12-27 ENCOUNTER — Telehealth: Payer: Self-pay | Admitting: Internal Medicine

## 2011-12-27 NOTE — Telephone Encounter (Signed)
Pt is in donut hole and can't afford xarelto and she wants to know if we have samples if not do you have any suggestions

## 2011-12-28 NOTE — Telephone Encounter (Signed)
Xarelto samples placed at front desk.  Pt aware.

## 2012-05-09 ENCOUNTER — Ambulatory Visit: Payer: Medicare Other | Admitting: Internal Medicine

## 2012-05-11 ENCOUNTER — Ambulatory Visit (INDEPENDENT_AMBULATORY_CARE_PROVIDER_SITE_OTHER): Payer: Medicare Other | Admitting: Internal Medicine

## 2012-05-11 ENCOUNTER — Encounter: Payer: Self-pay | Admitting: Internal Medicine

## 2012-05-11 VITALS — BP 124/68 | HR 72 | Ht 64.0 in | Wt 160.0 lb

## 2012-05-11 DIAGNOSIS — I4891 Unspecified atrial fibrillation: Secondary | ICD-10-CM

## 2012-05-11 DIAGNOSIS — R42 Dizziness and giddiness: Secondary | ICD-10-CM

## 2012-05-11 DIAGNOSIS — I480 Paroxysmal atrial fibrillation: Secondary | ICD-10-CM

## 2012-05-11 DIAGNOSIS — I10 Essential (primary) hypertension: Secondary | ICD-10-CM

## 2012-05-11 MED ORDER — RIVAROXABAN 20 MG PO TABS: 20.0000 mg | ORAL_TABLET | Freq: Every day | ORAL | Status: DC

## 2012-05-11 MED ORDER — CHLORTHALIDONE 25 MG PO TABS: 25.0000 mg | ORAL_TABLET | Freq: Every day | ORAL | Status: DC | PRN

## 2012-05-11 MED ORDER — MIDODRINE HCL 2.5 MG PO TABS: 2.5000 mg | ORAL_TABLET | Freq: Three times a day (TID) | ORAL | Status: DC

## 2012-05-11 NOTE — Assessment & Plan Note (Signed)
Associated with a rapid rate her her vital signs record. Following the efforts above 2 mitigate her orthostasis, we will anticipate using an antiarrhythmic drug to try to reduce symptomatic tachypalpitations, rate control being not a great option as she has some degree of resting bradycardia

## 2012-05-11 NOTE — Assessment & Plan Note (Signed)
Patient has ongoing problems of orthostatic hypotension. I suggested that she put 4inch blocks underneath the head of her bed. We have reviewed again the value of isometric contraction and volume and potential for chlorthalidone to aggravate the problem.  We will begin her on ProAmatine 2.5 mg. Side effects. Will see her again in about 6 weeks

## 2012-05-11 NOTE — Patient Instructions (Addendum)
Your physician recommends that you schedule a follow-up appointment in: 6 weeks with Dr Graciela Husbands  Your physician has recommended you make the following change in your medication: TAKE Chlorthalidone only as needed now - START Proamatine 2.5 mg three times a day

## 2012-05-11 NOTE — Progress Notes (Signed)
Patient Care Team: Paulina Fusi as PCP - General (Internal Medicine) Duke Salvia, MD as Attending Physician (Cardiology)   HPI  Colleen Galloway is a 78 y.o. female Seen in followup for paroxysmal atrial fibrillation. She is currently anticoagulated. She had problems with edema on diltiazem and currently on metoprolol succinate.  she also has a history of HFpEF. Management of this has been complicated by orthostatic hypotension. Dizziness continues to be a significant problem. She also has paroxysms of atrial fibrillation which are quite disruptive and associated with a rapid rate. Blood pressures are relatively low. There have been no significant edema     Past Medical History  Diagnosis Date  . Atrial fibrillation   . Lumbosacral spondylosis without myelopathy   . Carotid artery disease     Followed by Dr. Edilia Bo  . Insomnia, unspecified   . Esophageal reflux   . Fibrocystic disease of breast   . Osteoarthrosis, unspecified whether generalized or localized, unspecified site   . Urinary tract infection   . Arthritis   . Unspecified sinusitis (chronic)   . Calculus of kidney     kidney stones    Past Surgical History  Procedure Laterality Date  . Dilation and curettage of uterus  1960  . Kidney stone surgery    . Varicose veins      Current Outpatient Prescriptions  Medication Sig Dispense Refill  . calcium citrate-vitamin D (CITRACAL+D) 315-200 MG-UNIT per tablet Take 1 tablet by mouth 2 (two) times daily.        . chlorthalidone (HYGROTON) 25 MG tablet Take 1 tablet (25 mg total) by mouth daily.  30 tablet  6  . doxycycline (ADOXA) 50 MG tablet Take 50 mg by mouth as needed.       Marland Kitchen estradiol (ESTRACE) 0.1 MG/GM vaginal cream Place 2 g vaginally daily.        . fish oil-omega-3 fatty acids 1000 MG capsule Take 2 g by mouth daily.        . meloxicam (MOBIC) 15 MG tablet Take 15 mg by mouth as needed.      . metoprolol succinate (TOPROL-XL) 100 MG 24 hr tablet  Take 1 tablet (100 mg total) by mouth daily. Take with or immediately following a meal.  30 tablet  6  . Multiple Vitamin (MULTIVITAMIN) capsule Take 1 capsule by mouth daily.        Marland Kitchen omeprazole (PRILOSEC) 20 MG capsule Take 20 mg by mouth daily.        . Rivaroxaban 20 MG TABS Take 20 mg by mouth daily.  30 tablet  11  . simvastatin (ZOCOR) 40 MG tablet Take 40 mg by mouth at bedtime.       . traZODone (DESYREL) 50 MG tablet Take 50 mg by mouth at bedtime.        . vitamin C (ASCORBIC ACID) 500 MG tablet Take 500 mg by mouth daily.         No current facility-administered medications for this visit.    Allergies  Allergen Reactions  . Flecainide Other (See Comments)    Sob, visual problems, swelling ankles  . Lopressor (Metoprolol Tartrate)     Severe dizziness  . Nebivolol Swelling    Sob, visual problems, swelling in ankles  . Sodium Pantothenate     Review of Systems negative except from HPI and PMH  Physical Exam BP 124/68  Pulse 72  Ht 5\' 4"  (1.626 m)  Wt 160 lb (72.576 kg)  BMI 27.45 kg/m2 Well developed and well nourished in no acute distress HENT normal E scleral and icterus clear Neck Supple JVP flat; carotids brisk and full Clear to ausculation  Regular rate and rhythm, no murmurs gallops or rub Soft with active bowel sounds No clubbing cyanosis none Edema Alert and oriented, grossly normal motor and sensory function Skin Warm and Dry  ECG demonstrates sinus rhythm at 63 Intervals 14/07/41 Nonspecific ST-T changes  Assessment and  Plan

## 2012-05-30 ENCOUNTER — Ambulatory Visit: Payer: Medicare Other | Admitting: Neurosurgery

## 2012-05-30 ENCOUNTER — Other Ambulatory Visit: Payer: Medicare Other

## 2012-06-18 ENCOUNTER — Encounter: Payer: Self-pay | Admitting: Internal Medicine

## 2012-06-20 ENCOUNTER — Other Ambulatory Visit: Payer: Medicare Other

## 2012-06-20 ENCOUNTER — Ambulatory Visit: Payer: Medicare Other | Admitting: Vascular Surgery

## 2012-06-26 ENCOUNTER — Encounter: Payer: Self-pay | Admitting: Vascular Surgery

## 2012-06-26 ENCOUNTER — Ambulatory Visit (INDEPENDENT_AMBULATORY_CARE_PROVIDER_SITE_OTHER): Payer: Medicare Other | Admitting: Internal Medicine

## 2012-06-26 VITALS — BP 122/70 | HR 74 | Ht 64.0 in | Wt 166.0 lb

## 2012-06-26 DIAGNOSIS — R42 Dizziness and giddiness: Secondary | ICD-10-CM

## 2012-06-26 DIAGNOSIS — I480 Paroxysmal atrial fibrillation: Secondary | ICD-10-CM

## 2012-06-26 DIAGNOSIS — I4891 Unspecified atrial fibrillation: Secondary | ICD-10-CM

## 2012-06-26 DIAGNOSIS — I5032 Chronic diastolic (congestive) heart failure: Secondary | ICD-10-CM

## 2012-06-26 MED ORDER — RIVAROXABAN 20 MG PO TABS: 20.0000 mg | ORAL_TABLET | Freq: Every day | ORAL | Status: DC

## 2012-06-26 MED ORDER — METOPROLOL SUCCINATE ER 25 MG PO TB24: 25.0000 mg | ORAL_TABLET | Freq: Every day | ORAL | Status: DC

## 2012-06-26 MED ORDER — MIDODRINE HCL 2.5 MG PO TABS: 2.5000 mg | ORAL_TABLET | Freq: Three times a day (TID) | ORAL | Status: DC

## 2012-06-26 NOTE — Assessment & Plan Note (Signed)
As above.

## 2012-06-26 NOTE — Assessment & Plan Note (Signed)
Holding sinus rhythm. We will continue her on anticoagulation. We will decrease her metoprolol from 50-25 to see if this doesn't help with some of her dizziness

## 2012-06-26 NOTE — Patient Instructions (Addendum)
Your physician has recommended you make the following change in your medication:  1) Decrease chlorthalidone to 25 mg 1/2 tablet by mouth once daily. 2) Decrease metoprolol succinate (toprol) to 25 mg once daily.  Please call Dr. Odessa Fleming office in about 2 weeks and let us know how you are doing with your medication changes.  Your physician wants you to follow-up in: 6 months with Dr. Graciela Husbands. You will receive a reminder letter in the mail two months in advance. If you don't receive a letter, please call our office to schedule the follow-up appointment.

## 2012-06-26 NOTE — Assessment & Plan Note (Signed)
she is currently euvolemic. We will continue to use her chlorthalidone on a when necessary basis and we'll decrease the dose from  25-12-1/2 to see if we can decrease the amount of diuresis

## 2012-06-26 NOTE — Progress Notes (Signed)
Patient Care Team: Paulina Fusi as PCP - General (Internal Medicine) Duke Salvia, MD as Attending Physician (Cardiology)   HPI  Colleen Galloway is a 78 y.o. female  Seen in followup for paroxysmal atrial fibrillation. She is currently anticoagulated. She had problems with edema on diltiazem and currently on metoprolol succinate.  She has has some peripheral edema with prolonged standing. She has some nocturnal dyspnea and shortness of breath.  She complains of lightheadedness particularly upon standing. This was major problem a few weeks ago adn we added proamatine, recommended elevating head of bed, and reviewed isometrics.  This is somewhat improved with the interventions we undertook including the ProAmatine. Interestingly, she thinks is better on days after she takes her diuretic.    Past Medical History  Diagnosis Date  . Atrial fibrillation   . Lumbosacral spondylosis without myelopathy   . Carotid artery disease     Followed by Dr. Edilia Bo  . Insomnia, unspecified   . Esophageal reflux   . Fibrocystic disease of breast   . Osteoarthrosis, unspecified whether generalized or localized, unspecified site   . Urinary tract infection   . Arthritis   . Unspecified sinusitis (chronic)   . Calculus of kidney     kidney stones  . Orthostatic lightheadedness 10/20/2011    Past Surgical History  Procedure Laterality Date  . Dilation and curettage of uterus  1960  . Kidney stone surgery    . Varicose veins      Current Outpatient Prescriptions  Medication Sig Dispense Refill  . calcium citrate-vitamin D (CITRACAL+D) 315-200 MG-UNIT per tablet Take 1 tablet by mouth 2 (two) times daily.        . chlorthalidone (HYGROTON) 25 MG tablet Take 1 tablet (25 mg total) by mouth daily as needed.  30 tablet  6  . doxycycline (ADOXA) 50 MG tablet Take 50 mg by mouth as needed.       Marland Kitchen estradiol (ESTRACE) 0.1 MG/GM vaginal cream Place 2 g vaginally daily.        . fish oil-omega-3  fatty acids 1000 MG capsule Take 2 g by mouth daily.        . meloxicam (MOBIC) 15 MG tablet Take 15 mg by mouth as needed.      . metoprolol succinate (TOPROL-XL) 100 MG 24 hr tablet Take 1 tablet (100 mg total) by mouth daily. Take with or immediately following a meal.  30 tablet  6  . midodrine (PROAMATINE) 2.5 MG tablet Take 1 tablet (2.5 mg total) by mouth 3 (three) times daily.  90 tablet  3  . Multiple Vitamin (MULTIVITAMIN) capsule Take 1 capsule by mouth daily.        Marland Kitchen omeprazole (PRILOSEC) 20 MG capsule Take 20 mg by mouth daily.        . Rivaroxaban (XARELTO) 20 MG TABS Take 1 tablet (20 mg total) by mouth daily.  30 tablet  11  . simvastatin (ZOCOR) 40 MG tablet Take 40 mg by mouth at bedtime.       . traZODone (DESYREL) 50 MG tablet Take 50 mg by mouth at bedtime.        . vitamin C (ASCORBIC ACID) 500 MG tablet Take 500 mg by mouth daily.         No current facility-administered medications for this visit.    Allergies  Allergen Reactions  . Flecainide Other (See Comments)    Sob, visual problems, swelling ankles  . Lopressor (Metoprolol Tartrate)  Severe dizziness  . Nebivolol Swelling    Sob, visual problems, swelling in ankles  . Sodium Pantothenate     Review of Systems negative except from HPI and PMH  Physical Exam BP 122/70  Pulse 74  Ht 5\' 4"  (1.626 m)  Wt 166 lb (75.297 kg)  BMI 28.48 kg/m2 Well developed and well nourished in no acute distress HENT normal E scleral and icterus clear Neck Supple JVP flat; carotids brisk and full Clear to ausculation Regular rate and rhythm, no murmurs gallops or rub Soft with active bowel sounds No clubbing cyanosis none Edema Alert and oriented, grossly normal motor and sensory function Skin Warm and Dry    Assessment and  Plan

## 2012-06-27 ENCOUNTER — Encounter: Payer: Self-pay | Admitting: Vascular Surgery

## 2012-06-27 ENCOUNTER — Other Ambulatory Visit (INDEPENDENT_AMBULATORY_CARE_PROVIDER_SITE_OTHER): Payer: Medicare Other | Admitting: Vascular Surgery

## 2012-06-27 ENCOUNTER — Ambulatory Visit (INDEPENDENT_AMBULATORY_CARE_PROVIDER_SITE_OTHER): Payer: Medicare Other | Admitting: Vascular Surgery

## 2012-06-27 DIAGNOSIS — I6529 Occlusion and stenosis of unspecified carotid artery: Secondary | ICD-10-CM

## 2012-06-27 NOTE — Progress Notes (Signed)
[facility] [place]  Patient name: Colleen Galloway MRN: [id number] DOB: 09/15/1934 Sex: female  REASON FOR VISIT: follow up of carotid disease  HPI: Colleen Galloway is a 78 y.o. female who was last seen in our office in November of 2013. At that time she had a moderate right carotid stenosis. She comes in for a 6 month follow up visit. Since she was seen last, she denies any history of stroke, TIAs, expressive or receptive aphasia, or amaurosis fugax. She has had some problems with dizziness and is followed by Dr. Berton Mount. She tells me that this is felt to be related to her medications which are being adjusted. She is not on aspirin at she is on [place].   Past Medical History  Diagnosis Date  . Atrial fibrillation   . Lumbosacral spondylosis without myelopathy   . Carotid artery disease     Followed by Dr. Edilia Bo  . Insomnia, unspecified   . Esophageal reflux   . Fibrocystic disease of breast   . Osteoarthrosis, unspecified whether generalized or localized, unspecified site   . Urinary tract infection   . Arthritis   . Unspecified sinusitis (chronic)   . Orthostatic lightheadedness 10/20/2011  . Calculus of kidney     kidney stones   Family History  Problem Relation Age of Onset  . Heart disease Mother   . Asthma Mother   . Hypertension Mother   . Cancer Mother   . Deep vein thrombosis Mother   . Other Mother     varicose veins  . Heart disease Father   . Hypertension Father   . Stroke Father   . Heart disease Sister   . Hypertension Sister   . Hyperlipidemia Sister   . Heart disease Brother   . Asthma Brother   . Hypertension Brother   . Deep vein thrombosis Brother   . Diabetes Brother   . Heart disease Other   . Hypertension Other   . Colon cancer Other    SOCIAL HISTORY: History  Substance Use Topics  . Smoking status: Never Smoker   . Smokeless tobacco: Never Used  . Alcohol Use: No   Allergies  Allergen Reactions  . Flecainide  Other (See Comments)    Sob, visual problems, swelling ankles  . Lopressor (Metoprolol Tartrate)     Severe dizziness  . Nebivolol Swelling    Sob, visual problems, swelling in ankles  . Sodium Pantothenate    Current Outpatient Prescriptions  Medication Sig Dispense Refill  . calcium citrate-vitamin D (CITRACAL+D) 315-200 MG-UNIT per tablet Take 1 tablet by mouth 2 (two) times daily.        . chlorthalidone (HYGROTON) 25 MG tablet Take 1/2 tablet by mouth once daily      . doxycycline (ADOXA) 50 MG tablet Take 50 mg by mouth as needed.       Marland Kitchen estradiol (ESTRACE) 0.1 MG/GM vaginal cream Place 2 g vaginally as needed.       . fish oil-omega-3 fatty acids 1000 MG capsule Take 2 g by mouth daily.        . meloxicam (MOBIC) 15 MG tablet Take 15 mg by mouth as needed.      . metoprolol succinate (TOPROL-XL) 25 MG 24 hr tablet Take 1 tablet (25 mg total) by mouth daily.  90 tablet  3  . midodrine (PROAMATINE) 2.5 MG tablet Take 1 tablet (2.5 mg total) by mouth 3 (three) times daily.  90 tablet  3  . Multiple Vitamin (MULTIVITAMIN) capsule Take 1 capsule by mouth daily.        Marland Kitchen NAPROXEN PO Take by mouth as needed.      . naproxen sodium (ANAPROX) 220 MG tablet Take 220 mg by mouth 2 (two) times daily with a meal. As needed      . omeprazole (PRILOSEC) 20 MG capsule Take 20 mg by mouth daily.        . Rivaroxaban (XARELTO) 20 MG TABS Take 1 tablet (20 mg total) by mouth daily.  270 tablet  3  . simvastatin (ZOCOR) 40 MG tablet Take 40 mg by mouth at bedtime.       . traZODone (DESYREL) 50 MG tablet Take 50 mg by mouth at bedtime.        . vitamin C (ASCORBIC ACID) 500 MG tablet Take 500 mg by mouth daily.         No current facility-administered medications for this visit.   REVIEW OF SYSTEMS: Arly.Keller ] denotes positive finding; [  ] denotes negative finding  CARDIOVASCULAR:  [ ]  chest pain   [ ]  chest pressure   [ ]  palpitations   Arly.Keller ] orthopnea   Arly.Keller ] dyspnea on exertion   [ ]  claudication   [  ] rest pain   [ ]  DVT   [ ]  phlebitis PULMONARY:   [ ]  productive cough   [ ]  asthma   [ ]  wheezing NEUROLOGIC:   [ ]  weakness  [ ]  paresthesias  [ ]  aphasia  [ ]  amaurosis  Arly.Keller ] dizziness HEMATOLOGIC:   [ ]  bleeding problems   [ ]  clotting disorders MUSCULOSKELETAL:  [ ]  joint pain   [ ]  joint swelling [ ]  leg swelling GASTROINTESTINAL: [ ]   blood in stool  [ ]   hematemesis GENITOURINARY:  [ ]   dysuria  [ ]   hematuria PSYCHIATRIC:  [ ]  history of major depression INTEGUMENTARY:  [ ]  rashes  [ ]  ulcers CONSTITUTIONAL:  [ ]  fever   [ ]  chills  PHYSICAL EXAM: Filed Vitals:   06/27/12 1048 06/27/12 1049  BP: 147/74 138/66  Pulse: 58   Height: 5\' 4"  (1.626 m)   Weight: 168 lb 4.8 oz (76.34 kg)   SpO2: 100%    Body mass index is 28.87 kg/(m^2). GENERAL: The patient is a well-nourished female, in no acute distress. The vital signs are documented above. CARDIOVASCULAR: There is a regular rate and rhythm. I do not detect carotid bruits. PULMONARY: There is good air exchange bilaterally without wheezing or rales. ABDOMEN: Soft and non-tender with normal pitched bowel sounds.  MUSCULOSKELETAL: There are no major deformities or cyanosis. NEUROLOGIC: No focal weakness or paresthesias are detected. SKIN: There are no ulcers or rashes noted. PSYCHIATRIC: The patient has a normal affect.  DATA:  I have independently interpreted her rotted duplex scan which is a 40-59% right carotid stenosis. This is in the high and of that range. There is no significant carotid stenosis on the left. Both for T. April arteries are patent with normally directed flow.  MEDICAL ISSUES:  Occlusion and stenosis of carotid artery without mention of cerebral infarction This patient has an asymptomatic 40-59% right carotid stenosis with no significant stenosis on the left. She has no evidence of vertebral basilar insufficiency on duplex scan. She understands we would not consider right carotid endarterectomy unless the  stenosis progressed to greater than 80% or she developed new right hemispheric symptoms. I have ordered  a follow carotid duplex scan in 1 year and I will see her back at that time. She knows to call sooner if she has problems. Currently she is not on aspirin as she is on Xarelto.    Ayaansh Smail S [facility] [place] Beeper: [phone]

## 2012-06-27 NOTE — Assessment & Plan Note (Signed)
This patient has an asymptomatic 40-59% right carotid stenosis with no significant stenosis on the left. She has no evidence of vertebral basilar insufficiency on duplex scan. She understands we would not consider right carotid endarterectomy unless the stenosis progressed to greater than 80% or she developed new right hemispheric symptoms. I have ordered a follow carotid duplex scan in 1 year and I will see her back at that time. She knows to call sooner if she has problems. Currently she is not on aspirin as she is on Xarelto.

## 2012-06-27 NOTE — Addendum Note (Signed)
Addended by: Adria Dill L on: 06/27/2012 02:44 PM   Modules accepted: Orders

## 2012-07-16 ENCOUNTER — Telehealth: Payer: Self-pay | Admitting: Internal Medicine

## 2012-07-16 NOTE — Telephone Encounter (Signed)
Will make dr Graciela Husbands aware

## 2012-07-16 NOTE — Telephone Encounter (Signed)
FYI  Pt states that since she has cut back on the medication since her last visit with you that her dizziness has been better. She states she had and irregular heart beat this morning, her BP was 142/69 pulse 67 but this afternoon her BP was 122/63 pulse 65 but no irregular heart rate.   She did state that two days after seeing him her pulse was 131 but it has been fine until this morning.

## 2012-09-24 ENCOUNTER — Telehealth: Payer: Self-pay | Admitting: Internal Medicine

## 2012-09-24 NOTE — Telephone Encounter (Signed)
Spoke with pt, aware we do not have any xarelto samples at this time. She does not have any more tablets. She is going to take an aspirin daily.

## 2012-09-24 NOTE — Telephone Encounter (Signed)
New Prob  Pt states she is in the doughnut hole and her Xarelto is now $281 for a 30 day supply. She wants to know if there is something else she can take or if she can get some samples.

## 2012-10-02 NOTE — Telephone Encounter (Signed)
New Problem  Pt returning a call about medication.

## 2012-10-03 NOTE — Telephone Encounter (Signed)
Spoke with pt, xarelto samples given to pt.

## 2012-11-27 ENCOUNTER — Other Ambulatory Visit: Payer: Self-pay

## 2012-12-21 ENCOUNTER — Encounter: Payer: Self-pay | Admitting: Internal Medicine

## 2013-01-15 ENCOUNTER — Encounter: Payer: Self-pay | Admitting: Internal Medicine

## 2013-01-15 ENCOUNTER — Ambulatory Visit (INDEPENDENT_AMBULATORY_CARE_PROVIDER_SITE_OTHER): Payer: Medicare Other | Admitting: Internal Medicine

## 2013-01-15 VITALS — BP 136/66 | HR 63 | Ht 62.0 in | Wt 168.0 lb

## 2013-01-15 DIAGNOSIS — R42 Dizziness and giddiness: Secondary | ICD-10-CM

## 2013-01-15 DIAGNOSIS — I480 Paroxysmal atrial fibrillation: Secondary | ICD-10-CM

## 2013-01-15 DIAGNOSIS — I4891 Unspecified atrial fibrillation: Secondary | ICD-10-CM

## 2013-01-15 DIAGNOSIS — I5032 Chronic diastolic (congestive) heart failure: Secondary | ICD-10-CM

## 2013-01-15 HISTORY — DX: Dizziness and giddiness: R42

## 2013-01-15 MED ORDER — MECLIZINE HCL 25 MG PO TABS: 25.0000 mg | ORAL_TABLET | Freq: Three times a day (TID) | ORAL | Status: DC | PRN

## 2013-01-15 NOTE — Progress Notes (Signed)
Patient Care Team: Paulina Fusi, MD as PCP - General (Internal Medicine) Duke Salvia, MD as Attending Physician (Cardiology)   HPI  Colleen Galloway is a 78 y.o. female Seen in followup for paroxysmal atrial fibrillation. She is currently anticoagulated. She had problems with edema on diltiazem and currently on metoprolol succinate.  She has has some peripheral edema with prolonged standing. She has some nocturnal dyspnea and shortness of breath.  She complains of lightheadedness particularly upon standing. This was major problem a few weeks ago adn we added proamatine, recommended elevating head of bed, and reviewed isometrics. This is somewhat improved with the interventions we undertook including the ProAmatine. Interestingly, she thinks is better on days after she takes her diuretic.     She has had periodic dizziness. It is not positional  It does not change overtime and is not aggravated by changes in head position. There is a vertiginous sensation and some accompanying nausea.  Has also had some problems with palpitations. They're not too bad. She struggled to afford her Rivaroxaban   Past Medical History  Diagnosis Date  . Atrial fibrillation   . Lumbosacral spondylosis without myelopathy   . Carotid artery disease     Followed by Dr. Edilia Bo  . Insomnia, unspecified   . Esophageal reflux   . Fibrocystic disease of breast   . Osteoarthrosis, unspecified whether generalized or localized, unspecified site   . Urinary tract infection   . Arthritis   . Unspecified sinusitis (chronic)   . Orthostatic lightheadedness 10/20/2011  . Calculus of kidney     kidney stones    Past Surgical History  Procedure Laterality Date  . Dilation and curettage of uterus  1960  . Kidney stone surgery    . Varicose veins      Current Outpatient Prescriptions  Medication Sig Dispense Refill  . calcium citrate-vitamin D (CITRACAL+D) 315-200 MG-UNIT per tablet Take 1 tablet by  mouth daily.       . chlorthalidone (HYGROTON) 25 MG tablet Take 25 mg by mouth daily.       Marland Kitchen doxycycline (ADOXA) 50 MG tablet Take 50 mg by mouth as needed.       Marland Kitchen estradiol (ESTRACE) 0.1 MG/GM vaginal cream Place 2 g vaginally as needed.       . meloxicam (MOBIC) 15 MG tablet Take 15 mg by mouth as needed.      . metoprolol succinate (TOPROL-XL) 25 MG 24 hr tablet Take 1 tablet (25 mg total) by mouth daily.  90 tablet  3  . midodrine (PROAMATINE) 2.5 MG tablet Take 1 tablet (2.5 mg total) by mouth 3 (three) times daily.  90 tablet  3  . Multiple Vitamin (MULTIVITAMIN) capsule Take 1 capsule by mouth daily.        . Omega-3 Fatty Acids (FISH OIL) 1200 MG CAPS Take 2 capsules by mouth daily.      Marland Kitchen omeprazole (PRILOSEC) 20 MG capsule Take 20 mg by mouth daily.        . Rivaroxaban (XARELTO) 20 MG TABS Take 1 tablet (20 mg total) by mouth daily.  270 tablet  3  . simvastatin (ZOCOR) 40 MG tablet Take 40 mg by mouth at bedtime.       . traZODone (DESYREL) 50 MG tablet Take 50-100 mg by mouth at bedtime.       . vitamin C (ASCORBIC ACID) 500 MG tablet Take 500 mg by mouth daily.  No current facility-administered medications for this visit.    Allergies  Allergen Reactions  . Flecainide Other (See Comments)    Sob, visual problems, swelling ankles  . Lopressor [Metoprolol Tartrate]     Severe dizziness  . Nebivolol Swelling    Sob, visual problems, swelling in ankles  . Sodium Pantothenate     Review of Systems negative except from HPI and PMH  Physical Exam BP 136/66  Pulse 63  Ht 5\' 2"  (1.575 m)  Wt 168 lb (76.204 kg)  BMI 30.72 kg/m2 Well developed and well nourished in no acute distress HENT normal E scleral and icterus clear Neck Supple JVP flat; carotids brisk and full Clear to ausculation Ireegular rate and rhythm, no murmurs gallops or rub Soft with active bowel sounds No clubbing cyanosis none Edema Alert and oriented, grossly normal motor and sensory  function Skin Warm and Dry    Assessment and  Plan/

## 2013-01-15 NOTE — Assessment & Plan Note (Signed)
She has significant dizziness which by history does not sound cardiovascular given its protracted nature and is non-positional dependence It  has other epiphenomena suggests a vertiginous syndrome. I consider MRI scanning to identify the possibility of a stroke; however, there are no data that would tell us  and in alternative to anticoagulation would be appropriate in the absence of bleeding reports that she does not had.. Thus we will continue her on her current medication

## 2013-01-15 NOTE — Assessment & Plan Note (Signed)
The patient has paroxysmal atrial fibrillation with unclear frequency. She is on anticoagulation.

## 2013-01-15 NOTE — Assessment & Plan Note (Signed)
Euvolemic we'll continue current medications 

## 2013-01-15 NOTE — Patient Instructions (Addendum)
Your physician has recommended you make the following change in your medication:  1) Start Antivert 25 mg three times daily as needed  Your physician wants you to follow-up in: 6 months with Dr. Graciela Husbands. You will receive a reminder letter in the mail two months in advance. If you don't receive a letter, please call our office to schedule the follow-up appointment.

## 2013-01-23 ENCOUNTER — Other Ambulatory Visit: Payer: Self-pay | Admitting: *Deleted

## 2013-01-23 DIAGNOSIS — I480 Paroxysmal atrial fibrillation: Secondary | ICD-10-CM

## 2013-01-23 DIAGNOSIS — R42 Dizziness and giddiness: Secondary | ICD-10-CM

## 2013-01-23 DIAGNOSIS — I4891 Unspecified atrial fibrillation: Secondary | ICD-10-CM

## 2013-01-23 DIAGNOSIS — I5032 Chronic diastolic (congestive) heart failure: Secondary | ICD-10-CM

## 2013-01-23 MED ORDER — MIDODRINE HCL 2.5 MG PO TABS: 2.5000 mg | ORAL_TABLET | Freq: Three times a day (TID) | ORAL | Status: DC

## 2013-03-28 ENCOUNTER — Other Ambulatory Visit: Payer: Self-pay

## 2013-03-28 DIAGNOSIS — I480 Paroxysmal atrial fibrillation: Secondary | ICD-10-CM

## 2013-03-28 DIAGNOSIS — I5032 Chronic diastolic (congestive) heart failure: Secondary | ICD-10-CM

## 2013-03-28 DIAGNOSIS — R42 Dizziness and giddiness: Secondary | ICD-10-CM

## 2013-03-28 DIAGNOSIS — I4891 Unspecified atrial fibrillation: Secondary | ICD-10-CM

## 2013-03-28 MED ORDER — MIDODRINE HCL 2.5 MG PO TABS: 2.5000 mg | ORAL_TABLET | Freq: Three times a day (TID) | ORAL | Status: DC

## 2013-03-28 MED ORDER — METOPROLOL SUCCINATE ER 25 MG PO TB24: 25.0000 mg | ORAL_TABLET | Freq: Every day | ORAL | Status: DC

## 2013-03-28 MED ORDER — RIVAROXABAN 20 MG PO TABS: 20.0000 mg | ORAL_TABLET | Freq: Every day | ORAL | Status: DC

## 2013-04-05 ENCOUNTER — Other Ambulatory Visit: Payer: Self-pay | Admitting: Vascular Surgery

## 2013-04-05 DIAGNOSIS — I6529 Occlusion and stenosis of unspecified carotid artery: Secondary | ICD-10-CM

## 2013-04-08 ENCOUNTER — Telehealth: Payer: Self-pay | Admitting: Internal Medicine

## 2013-04-08 NOTE — Telephone Encounter (Signed)
Advised patient that this was same medication and safe to take. Patient thanked me for information and agreeable to plan.

## 2013-04-08 NOTE — Telephone Encounter (Signed)
New message      Midodrine HCL 2.5mg  is what pt normally take----but right source shipped midodrine 2.5mg  not HCL.  Is this the same medication?

## 2013-05-16 ENCOUNTER — Other Ambulatory Visit: Payer: Self-pay | Admitting: Internal Medicine

## 2013-07-02 ENCOUNTER — Encounter: Payer: Self-pay | Admitting: Vascular Surgery

## 2013-07-03 ENCOUNTER — Encounter (INDEPENDENT_AMBULATORY_CARE_PROVIDER_SITE_OTHER): Payer: Self-pay

## 2013-07-03 ENCOUNTER — Ambulatory Visit (HOSPITAL_COMMUNITY)
Admission: RE | Admit: 2013-07-03 | Discharge: 2013-07-03 | Disposition: A | Discharge status: Home / Self Care | Payer: Medicare HMO | Source: Ambulatory Visit | Attending: Vascular Surgery | Admitting: Vascular Surgery

## 2013-07-03 ENCOUNTER — Ambulatory Visit (INDEPENDENT_AMBULATORY_CARE_PROVIDER_SITE_OTHER): Payer: Commercial Managed Care - HMO | Admitting: Vascular Surgery

## 2013-07-03 ENCOUNTER — Encounter: Payer: Self-pay | Admitting: Vascular Surgery

## 2013-07-03 VITALS — BP 132/71 | HR 69 | Ht 62.0 in | Wt 172.0 lb

## 2013-07-03 DIAGNOSIS — I6529 Occlusion and stenosis of unspecified carotid artery: Secondary | ICD-10-CM

## 2013-07-03 NOTE — Assessment & Plan Note (Signed)
This patient has an asymptomatic 40-59% right carotid stenosis with no significant stenosis on the left. She understands we would not consider carotid endarterectomy was the stenosis progressed to greater than 80%. Ordered a fall carotid duplex scan in 1 year and I'll see her back at that time. She knows to call sooner if she has problems.

## 2013-07-03 NOTE — Progress Notes (Signed)
[facility]  Patient name: Colleen Galloway MRN: [id number] DOB: 11-18-1934 Sex: female  REASON FOR VISIT: Follow up of carotid disease  HPI: Colleen Galloway is a 79 y.o. female who I have been following with carotid disease. She comes in for a one year follow up visit. When I last saw her on 06/27/2012, she was asymptomatic and had an asymptomatic 40-59% right carotid stenosis with no significant stenosis on the left.  Since I saw her last, she denies any history of stroke, TIAs, expressive or receptive aphasia, or amaurosis fugax. She is not on aspirin at she is on Combs. She did bring her labs from you reviewed today and it looks like her LDL, HDL and creatinine all look to be in excellent range. He remains fairly active.  Past Medical History  Diagnosis Date  . Atrial fibrillation   . Lumbosacral spondylosis without myelopathy   . Carotid artery disease     Followed by Dr. Scot Dock  . Insomnia, unspecified   . Esophageal reflux   . Fibrocystic disease of breast   . Osteoarthrosis, unspecified whether generalized or localized, unspecified site   . Urinary tract infection   . Arthritis   . Unspecified sinusitis (chronic)   . Orthostatic lightheadedness 10/20/2011  . Calculus of kidney     kidney stones   Family History  Problem Relation Age of Onset  . Heart disease Mother   . Asthma Mother   . Hypertension Mother   . Cancer Mother   . Deep vein thrombosis Mother   . Other Mother     varicose veins  . Heart disease Father   . Hypertension Father   . Stroke Father   . Heart disease Sister   . Hypertension Sister   . Hyperlipidemia Sister   . Heart disease Brother   . Asthma Brother   . Hypertension Brother   . Deep vein thrombosis Brother   . Diabetes Brother   . Heart disease Other   . Hypertension Other   . Colon cancer Other    SOCIAL HISTORY: History  Substance Use Topics  . Smoking status: Never Smoker   . Smokeless tobacco:  Never Used  . Alcohol Use: No   Allergies  Allergen Reactions  . Diltiazem Hcl     Pt states it causes her ankle swelling   . Flecainide Other (See Comments)    Sob, visual problems, swelling ankles  . Lopressor [Metoprolol Tartrate]     Severe dizziness  . Nebivolol Swelling    Sob, visual problems, swelling in ankles  . Sodium Pantothenate    Current Outpatient Prescriptions  Medication Sig Dispense Refill  . calcium citrate-vitamin D (CITRACAL+D) 315-200 MG-UNIT per tablet Take 1 tablet by mouth daily.       . chlorthalidone (HYGROTON) 25 MG tablet Take 25 mg by mouth as needed.       . doxycycline (ADOXA) 50 MG tablet Take 50 mg by mouth as needed.       Marland Kitchen estradiol (ESTRACE) 0.1 MG/GM vaginal cream Place 2 g vaginally as needed.       . meclizine (ANTIVERT) 25 MG tablet Take 1 tablet (25 mg total) by mouth 3 (three) times daily as needed for dizziness.  90 tablet  2  . meloxicam (MOBIC) 15 MG tablet Take 15 mg by mouth as needed.      . metoprolol succinate (TOPROL-XL) 25 MG 24 hr tablet Take 1 tablet (25 mg total)  by mouth daily.  90 tablet  1  . midodrine (PROAMATINE) 2.5 MG tablet TAKE 1 TABLET  BY MOUTH 3  TIMES DAILY.  180 tablet  1  . Multiple Vitamin (MULTIVITAMIN) capsule Take 1 capsule by mouth daily.        . Omega-3 Fatty Acids (FISH OIL) 1200 MG CAPS Take 2 capsules by mouth daily.      . Omega-3 Fatty Acids (SALMON OIL-1000) 200 MG CAPS Take 1 capsule by mouth daily with breakfast.      . omeprazole (PRILOSEC) 20 MG capsule Take 20 mg by mouth daily.        . Rivaroxaban (XARELTO) 20 MG TABS tablet Take 1 tablet (20 mg total) by mouth daily.  270 tablet  1  . simvastatin (ZOCOR) 40 MG tablet Take 40 mg by mouth at bedtime.       . traZODone (DESYREL) 50 MG tablet Take 50-100 mg by mouth at bedtime.       . vitamin C (ASCORBIC ACID) 500 MG tablet Take 500 mg by mouth daily.         No current facility-administered medications for this visit.   REVIEW OF SYSTEMS:  Valu.Nieves ] denotes positive finding; [  ] denotes negative finding  CARDIOVASCULAR:  [ ]  chest pain   [ ]  chest pressure   [ ]  palpitations   [ ]  orthopnea   [ ]  dyspnea on exertion   [ ]  claudication   [ ]  rest pain   [ ]  DVT   [ ]  phlebitis PULMONARY:   [ ]  productive cough   [ ]  asthma   [ ]  wheezing NEUROLOGIC:   [ ]  weakness  [ ]  paresthesias  [ ]  aphasia  [ ]  amaurosis  [ ]  dizziness HEMATOLOGIC:   [ ]  bleeding problems   [ ]  clotting disorders MUSCULOSKELETAL:  [ ]  joint pain   [ ]  joint swelling [ ]  leg swelling GASTROINTESTINAL: [ ]   blood in stool  [ ]   hematemesis GENITOURINARY:  [ ]   dysuria  [ ]   hematuria PSYCHIATRIC:  [ ]  history of major depression INTEGUMENTARY:  [ ]  rashes  [ ]  ulcers CONSTITUTIONAL:  [ ]  fever   [ ]  chills  PHYSICAL EXAM: Filed Vitals:   07/03/13 1332  BP: 132/71  Pulse: 69  Height: 5\' 2"  (1.575 m)  Weight: 172 lb (78.019 kg)  SpO2: 100%   Body mass index is 31.45 kg/(m^2). GENERAL: The patient is a well-nourished female, in no acute distress. The vital signs are documented above. CARDIOVASCULAR: There is a regular rate and rhythm. She has a right carotid bruit. PULMONARY: There is good air exchange bilaterally without wheezing or rales. ABDOMEN: Soft and non-tender with normal pitched bowel sounds.  MUSCULOSKELETAL: There are no major deformities or cyanosis. NEUROLOGIC: No focal weakness or paresthesias are detected. SKIN: There are no ulcers or rashes noted. PSYCHIATRIC: The patient has a normal affect.  DATA:  I have independently interpreted her carotid duplex scan which is a 40-59% right carotid stenosis with no significant stenosis on the left. Both vertebral arteries are patent with antegrade flow.  I have compared this study to her study year ago in the velocities on the right have not changed significantly.  MEDICAL ISSUES:  Occlusion and stenosis of carotid artery without mention of cerebral infarction This patient has an asymptomatic  40-59% right carotid stenosis with no significant stenosis on the left. She understands we would not consider carotid endarterectomy  was the stenosis progressed to greater than 80%. Ordered a fall carotid duplex scan in 1 year and I'll see her back at that time. She knows to call sooner if she has problems.    Return in about 1 year (around 07/04/2014).  Angelia Mould [facility] [place] Beeper: [phone]

## 2013-07-03 NOTE — Addendum Note (Signed)
Addended by: Mena Goes on: 07/03/2013 05:04 PM   Modules accepted: Orders

## 2013-07-23 ENCOUNTER — Encounter: Payer: Self-pay | Admitting: Internal Medicine

## 2013-07-23 ENCOUNTER — Ambulatory Visit (INDEPENDENT_AMBULATORY_CARE_PROVIDER_SITE_OTHER): Payer: Commercial Managed Care - HMO | Admitting: Internal Medicine

## 2013-07-23 VITALS — BP 148/77 | HR 71 | Ht 64.0 in | Wt 169.0 lb

## 2013-07-23 DIAGNOSIS — I5032 Chronic diastolic (congestive) heart failure: Secondary | ICD-10-CM

## 2013-07-23 DIAGNOSIS — R42 Dizziness and giddiness: Secondary | ICD-10-CM

## 2013-07-23 DIAGNOSIS — I4891 Unspecified atrial fibrillation: Secondary | ICD-10-CM

## 2013-07-23 DIAGNOSIS — I480 Paroxysmal atrial fibrillation: Secondary | ICD-10-CM

## 2013-07-23 NOTE — Progress Notes (Signed)
Patient Care Team: Nicoletta Dress, MD as PCP - General (Internal Medicine) Deboraha Sprang, MD as Attending Physician (Cardiology)   HPI  Colleen Galloway is a 79 y.o. female Seen in followup for atrial fibrillation for which she is anticoagulated. She had problems with edema on calcium channel blockers and has been managed with beta blockers.  Dizziness thought to be orthostasis and was managed with  ProAmatine and isometrics  She is concerned about the cost of her Rivaroxaban  it comes out about $900 for 90 days.  She has a general sense of being not well. Is not very descriptive. There is no discomfort shortness of breath or edema   Past Medical History  Diagnosis Date  . Atrial fibrillation   . Lumbosacral spondylosis without myelopathy   . Carotid artery disease     Followed by Dr. Scot Dock  . Insomnia, unspecified   . Esophageal reflux   . Fibrocystic disease of breast   . Osteoarthrosis, unspecified whether generalized or localized, unspecified site   . Urinary tract infection   . Arthritis   . Unspecified sinusitis (chronic)   . Orthostatic lightheadedness 10/20/2011  . Calculus of kidney     kidney stones    Past Surgical History  Procedure Laterality Date  . Dilation and curettage of uterus  1960  . Kidney stone surgery    . Varicose veins      Current Outpatient Prescriptions  Medication Sig Dispense Refill  . calcium citrate-vitamin D (CITRACAL+D) 315-200 MG-UNIT per tablet Take 1 tablet by mouth daily.       . chlorthalidone (HYGROTON) 25 MG tablet Take 25 mg by mouth as needed.       . doxycycline (ADOXA) 50 MG tablet Take 50 mg by mouth as needed.       Marland Kitchen estradiol (ESTRACE) 0.1 MG/GM vaginal cream Place 2 g vaginally as needed.       . meclizine (ANTIVERT) 25 MG tablet Take 1 tablet (25 mg total) by mouth 3 (three) times daily as needed for dizziness.  90 tablet  2  . meloxicam (MOBIC) 15 MG tablet Take 15 mg by mouth as needed.      .  metoprolol succinate (TOPROL-XL) 25 MG 24 hr tablet Take 1 tablet (25 mg total) by mouth daily.  90 tablet  1  . midodrine (PROAMATINE) 2.5 MG tablet TAKE 1 TABLET  BY MOUTH 3  TIMES DAILY.  180 tablet  1  . Multiple Vitamin (MULTIVITAMIN) capsule Take 1 capsule by mouth daily.        . Omega-3 Fatty Acids (FISH OIL) 1200 MG CAPS Take 2 capsules by mouth daily.      . Omega-3 Fatty Acids (SALMON OIL-1000) 200 MG CAPS Take 1 capsule by mouth daily with breakfast.      . omeprazole (PRILOSEC) 20 MG capsule Take 20 mg by mouth daily.        . Rivaroxaban (XARELTO) 20 MG TABS tablet Take 1 tablet (20 mg total) by mouth daily.  270 tablet  1  . simvastatin (ZOCOR) 40 MG tablet Take 40 mg by mouth at bedtime.       . traZODone (DESYREL) 50 MG tablet Take 50-100 mg by mouth at bedtime.       . vitamin C (ASCORBIC ACID) 500 MG tablet Take 500 mg by mouth daily.         No current facility-administered medications for this visit.    Allergies  Allergen Reactions  . Diltiazem Hcl     Pt states it causes her ankle swelling   . Flecainide Other (See Comments)    Sob, visual problems, swelling ankles  . Lopressor [Metoprolol Tartrate]     Severe dizziness  . Nebivolol Swelling    Sob, visual problems, swelling in ankles  . Sodium Pantothenate     Review of Systems negative except from HPI and PMH  Physical Exam BP 148/77  Pulse 71  Ht 5\' 4"  (1.626 m)  Wt 169 lb (76.658 kg)  BMI 28.99 kg/m2 Well developed and well nourished in no acute distress HENT normal E scleral and icterus clear Neck Supple JVP flat; carotids brisk and full Clear to ausculation  Regular rate and rhythm, no murmurs gallops or rub Soft with active bowel sounds No clubbing cyanosis  Edema Alert and oriented, grossly normal motor and sensory function Skin Warm and Dry  ECG demonstrates sinus rhythm at 71 Intervals 16/07/38 Nonspecific ST-T changes  Assessment and  Plan  Atrial  fibrillation-paroxysmal  Hypertension  Malaise  We'll have her take an one-month exclusion trial for her statin. We'll also stop her ProAmatine and see if she is an abdominal binder as an alternative. Unfortunately there are no good alternatives to her NOACs; she is concerned about the use of Coumadin. The cost however is daunting.  Blood pressure is reasonably controlled. It may be also reasonable to think about stopping her diuretic especially given her orthostasis. She is as needed for edema.

## 2013-07-23 NOTE — Patient Instructions (Signed)
Your physician recommends that you continue on your current medications as directed. Please refer to the Current Medication list given to you today.  Your physician recommends that you schedule a follow-up appointment in: 3 months with Dr. Klein.  

## 2013-07-31 ENCOUNTER — Telehealth: Payer: Self-pay

## 2013-07-31 NOTE — Telephone Encounter (Signed)
Patient called for samples of xarelto gave to Fredia Beets to take to patient

## 2013-08-02 NOTE — Telephone Encounter (Signed)
Error

## 2013-09-17 DIAGNOSIS — C50919 Malignant neoplasm of unspecified site of unspecified female breast: Secondary | ICD-10-CM

## 2013-09-17 HISTORY — DX: Malignant neoplasm of unspecified site of unspecified female breast: C50.919

## 2013-09-18 ENCOUNTER — Other Ambulatory Visit: Payer: Self-pay | Admitting: Radiology

## 2013-09-18 DIAGNOSIS — C50912 Malignant neoplasm of unspecified site of left female breast: Secondary | ICD-10-CM

## 2013-09-26 ENCOUNTER — Encounter (INDEPENDENT_AMBULATORY_CARE_PROVIDER_SITE_OTHER): Payer: Self-pay | Admitting: General Surgery

## 2013-09-26 ENCOUNTER — Telehealth: Payer: Self-pay | Admitting: *Deleted

## 2013-09-26 ENCOUNTER — Other Ambulatory Visit (INDEPENDENT_AMBULATORY_CARE_PROVIDER_SITE_OTHER): Payer: Self-pay | Admitting: General Surgery

## 2013-09-26 ENCOUNTER — Ambulatory Visit (INDEPENDENT_AMBULATORY_CARE_PROVIDER_SITE_OTHER): Payer: Commercial Managed Care - HMO | Admitting: General Surgery

## 2013-09-26 VITALS — BP 160/80 | HR 64 | Temp 97.9°F | Resp 18 | Ht 64.0 in | Wt 164.4 lb

## 2013-09-26 DIAGNOSIS — C50912 Malignant neoplasm of unspecified site of left female breast: Secondary | ICD-10-CM

## 2013-09-26 DIAGNOSIS — C50919 Malignant neoplasm of unspecified site of unspecified female breast: Secondary | ICD-10-CM | POA: Insufficient documentation

## 2013-09-26 DIAGNOSIS — C50512 Malignant neoplasm of lower-outer quadrant of left female breast: Secondary | ICD-10-CM | POA: Insufficient documentation

## 2013-09-26 NOTE — Telephone Encounter (Signed)
Received referral from [facility].  Spoke with patient and confirmed appointment for 10/04/13 at 1230 for Financial counseling and 1pm with Dr. Lindi Adie. Mailed letter and packet.  Took paperwork to HIM for chart.

## 2013-09-26 NOTE — Patient Instructions (Signed)
We will refer you to the radiation oncologist and the medical oncologist.We will schedule the surgery after you have seen the radiation oncologist.

## 2013-09-26 NOTE — Progress Notes (Signed)
Patient ID: Colleen Galloway, female   DOB: Apr 17, 1934, 79 y.o.   MRN: [id number]  Chief Complaint  Patient presents with  . breast cancer    HPI Colleen Galloway is a 79 y.o. female.   HPI  She is referred by Dr. Marcelo Baldy because of a recent diagnosis of invasive left breast cancer. ER/PR are positive. She had an area asymmetry noted on one view on a screening left mammogram. It ended up being approximately 1 cm in size. Image guided biopsy demonstrated the above pathology. There is no family history of breast ancer. There is no personal history of breast cancer. Age at menarche was 44. She has not had any children. She has taken hormone replacement treatment for a prolonged period. She was menopausal somewhere between the ages of 54 and 35.  She is going to have a breast MRI tomorrow.  Past Medical History  Diagnosis Date  . Atrial fibrillation   . Lumbosacral spondylosis without myelopathy   . Carotid artery disease     Followed by Dr. Scot Dock  . Insomnia, unspecified   . Esophageal reflux   . Fibrocystic disease of breast   . Osteoarthrosis, unspecified whether generalized or localized, unspecified site   . Urinary tract infection   . Arthritis   . Unspecified sinusitis (chronic)   . Orthostatic lightheadedness 10/20/2011  . Calculus of kidney     kidney stones    Past Surgical History  Procedure Laterality Date  . Dilation and curettage of uterus  1960  . Kidney stone surgery    . Varicose veins      Family History  Problem Relation Age of Onset  . Heart disease Mother   . Asthma Mother   . Hypertension Mother   . Cancer Mother   . Deep vein thrombosis Mother   . Other Mother     varicose veins  . Heart disease Father   . Hypertension Father   . Stroke Father   . Heart disease Sister   . Hypertension Sister   . Hyperlipidemia Sister   . Heart disease Brother   . Asthma Brother   . Hypertension Brother   . Deep vein thrombosis Brother   . Diabetes Brother   . Heart  disease Other   . Hypertension Other   . Colon cancer Other     Social History History  Substance Use Topics  . Smoking status: Never Smoker   . Smokeless tobacco: Never Used  . Alcohol Use: No    Allergies  Allergen Reactions  . Diltiazem Hcl     Pt states it causes her ankle swelling   . Flecainide Other (See Comments)    Sob, visual problems, swelling ankles  . Lopressor [Metoprolol Tartrate]     Severe dizziness  . Nebivolol Swelling    Sob, visual problems, swelling in ankles  . Sodium Pantothenate     Current Outpatient Prescriptions  Medication Sig Dispense Refill  . calcium citrate-vitamin D (CITRACAL+D) 315-200 MG-UNIT per tablet Take 1 tablet by mouth daily.       . chlorthalidone (HYGROTON) 25 MG tablet Take 25 mg by mouth as needed.       . doxycycline (ADOXA) 50 MG tablet Take 50 mg by mouth as needed.       Marland Kitchen estradiol (ESTRACE) 0.1 MG/GM vaginal cream Place 2 g vaginally as needed.       . meclizine (ANTIVERT) 25 MG tablet Take 1 tablet (25 mg total) by mouth 3 (  three) times daily as needed for dizziness.  90 tablet  2  . meloxicam (MOBIC) 15 MG tablet Take 15 mg by mouth as needed.      . metoprolol succinate (TOPROL-XL) 25 MG 24 hr tablet Take 1 tablet (25 mg total) by mouth daily.  90 tablet  1  . Multiple Vitamin (MULTIVITAMIN) capsule Take 1 capsule by mouth daily.        . Omega-3 Fatty Acids (FISH OIL) 1200 MG CAPS Take 2 capsules by mouth daily.      . Omega-3 Fatty Acids (SALMON OIL-1000) 200 MG CAPS Take 1 capsule by mouth daily with breakfast.      . omeprazole (PRILOSEC) 20 MG capsule Take 20 mg by mouth daily.        . Rivaroxaban (XARELTO) 20 MG TABS tablet Take 1 tablet (20 mg total) by mouth daily.  270 tablet  1  . traZODone (DESYREL) 50 MG tablet Take 50-100 mg by mouth at bedtime.       . vitamin C (ASCORBIC ACID) 500 MG tablet Take 500 mg by mouth daily.        . midodrine (PROAMATINE) 2.5 MG tablet TAKE 1 TABLET  BY MOUTH 3  TIMES DAILY.   180 tablet  1  . simvastatin (ZOCOR) 40 MG tablet Take 40 mg by mouth at bedtime.        No current facility-administered medications for this visit.    Review of Systems Review of Systems  Constitutional: Negative.   HENT: Negative.   Respiratory: Negative.   Cardiovascular: Positive for palpitations.  Gastrointestinal: Negative.   Genitourinary: Negative.   Musculoskeletal: Positive for arthralgias.  Neurological: Negative.   Hematological: Negative.     Blood pressure 160/80, pulse 64, temperature 97.9 F (36.6 C), temperature source Oral, resp. rate 18, height 5\' 4"  (1.626 m), weight 164 lb 6.4 oz (74.571 kg).  Physical Exam Physical Exam  Constitutional: She appears well-developed and well-nourished. No distress.  HENT:  Head: Normocephalic and atraumatic.  Eyes: EOM are normal. No scleral icterus.  Neck: Neck supple.  Cardiovascular: Normal rate and regular rhythm.   Pulmonary/Chest: Effort normal and breath sounds normal.  The breasts are large and symmetrical in size.  There are no palpable dominant masses in either breast.There is a small puncture wound with surrounding ecchymosis in the superior aspect of the left breast.  Abdominal: Soft. She exhibits no distension and no mass. There is no tenderness.  Musculoskeletal: She exhibits no edema.  No supraclavicular or axillary adenopathy.  Lymphadenopathy:    She has no cervical adenopathy.  Neurological: She is alert.  Skin: Skin is warm and dry.  Psychiatric: She has a normal mood and affect. Her behavior is normal.    Data Reviewed Mammogram and ultrasound. Pathology  Assessment    Invasive ductal carcinoma of left breast. She is a good candidate for breast conservation and is interested in this. She would like to see the radiation oncologist prior to making a final decision as she is concerned what affects radiation may have on her heart.  She also has paroxysmal atrial fibrillation and will need to  communicate with her cardiologist to see if any further preoperative workup is needed.    Plan    Referral to radiation oncology and medical oncology.Will send a note cardiologist to see if she needs any preoperative cardiac assessment.  At this time, breast conservation is planned. Plan left partial mastectomy after radioactive seed localization and left axillary sentinel  lymph node biopsy.  I have explained the procedure, risks, and aftercare.  The risks include but are not limited to bleeding, infection, wound problems, seroma formation, anesthesia, nerve injury, lymphedema, need for reexcision or removal of more lymph nodes at a later time.  She seems to understand and agrees with the plan.       Jamee Keach J 09/26/2013, 10:46 AM

## 2013-09-27 ENCOUNTER — Ambulatory Visit
Admission: RE | Admit: 2013-09-27 | Discharge: 2013-09-27 | Disposition: A | Discharge status: Home / Self Care | Payer: Commercial Managed Care - HMO | Source: Ambulatory Visit | Attending: Radiology | Admitting: Radiology

## 2013-09-27 DIAGNOSIS — C50912 Malignant neoplasm of unspecified site of left female breast: Secondary | ICD-10-CM

## 2013-09-27 MED ORDER — GADOBENATE DIMEGLUMINE 529 MG/ML IV SOLN
15.0000 mL | Freq: Once | INTRAVENOUS | Status: AC | PRN
  Administered 2013-09-27: 15 mL via INTRAVENOUS

## 2013-10-01 ENCOUNTER — Telehealth: Payer: Self-pay | Admitting: *Deleted

## 2013-10-01 NOTE — Telephone Encounter (Signed)
Pt has insurance that requires a pre-auth before she can come here.  Kim called and gave me auth # [id number] for 6 visits for 6 months w/ an expiration date of 04/06/14.  Placed a note in EPIC for FA to see at time of check in.

## 2013-10-04 ENCOUNTER — Encounter: Payer: Self-pay | Admitting: Hematology and Oncology

## 2013-10-04 ENCOUNTER — Ambulatory Visit (HOSPITAL_BASED_OUTPATIENT_CLINIC_OR_DEPARTMENT_OTHER): Payer: Commercial Managed Care - HMO | Admitting: Hematology and Oncology

## 2013-10-04 ENCOUNTER — Encounter: Payer: Self-pay | Admitting: Radiation Oncology

## 2013-10-04 ENCOUNTER — Ambulatory Visit (HOSPITAL_BASED_OUTPATIENT_CLINIC_OR_DEPARTMENT_OTHER): Payer: Commercial Managed Care - HMO

## 2013-10-04 VITALS — BP 132/69 | HR 67 | Temp 97.4°F | Resp 18 | Ht 64.0 in | Wt 165.5 lb

## 2013-10-04 DIAGNOSIS — Z170 Estrogen receptor positive status [ER+]: Secondary | ICD-10-CM | POA: Diagnosis not present

## 2013-10-04 DIAGNOSIS — I4891 Unspecified atrial fibrillation: Secondary | ICD-10-CM | POA: Diagnosis not present

## 2013-10-04 DIAGNOSIS — I2510 Atherosclerotic heart disease of native coronary artery without angina pectoris: Secondary | ICD-10-CM

## 2013-10-04 DIAGNOSIS — C50919 Malignant neoplasm of unspecified site of unspecified female breast: Secondary | ICD-10-CM | POA: Diagnosis not present

## 2013-10-04 NOTE — Progress Notes (Addendum)
[facility] NOTE  Patient Care Team: Nicoletta Dress, MD as PCP - General (Internal Medicine) Deboraha Sprang, MD as Attending Physician (Cardiology) Rulon Eisenmenger, MD as Consulting Physician (Hematology and Oncology)  CHIEF COMPLAINTS/PURPOSE OF CONSULTATION:  Newly diagnosed breast cancer  HISTORY OF PRESENTING ILLNESS:  Colleen Galloway 79 y.o. Caucasian female is here because of recent diagnosis of left breast cancer. She has always had large breasts that are dense. She recently underwent a 3-D mammogram that revealed abnormalities in the left breast that led to a biopsy on 09/17/2013 that revealed invasive ductal carcinoma along with DCIS. The breast MRI was then performed that revealed a 1 cm mass with adjacent area of abnormality measuring a total of 1.8 cm. ER/PR were positive HER-2 was amplified. She had seen Dr. Zella Richer who sent her to [place] and to radiation oncology to come up with a multidisciplinary treatment plan. Patient is here accompanied by her sister and reports this felt her breast to be tender but otherwise no new complaints. She has occasional atrial fibrillation usually at the end of the day but not very symptomatic and she sees a cardiologist periodically.   I reviewed her records extensively and collaborated the history with the patient.  SUMMARY OF ONCOLOGIC HISTORY:   Breast cancer-left, invasive ductal, ER/PR positive   09/17/2013 Initial Diagnosis Left breast: invasive ductal cancer ER 100% PR 98% HER-2 amplified ratio 2.6 with gene copy #3.25 Ki-67 is 78%   09/27/2013 Breast MRI Left breast 1 cm mass with linear non-mass enhancement consistent with biopsy-proven DCIS dispense 1.8 cm. No pathologic lymphadenopathy    In terms of breast cancer risk profile:  She menarched at early age of 72 and went to menopause at age unknown  She had no pregnancies  She has not received birth control pills  She was never exposed to fertility medications or  hormone replacement therapy.  She does not have family history of Breast/GYN/GI cancer  MEDICAL HISTORY:  Past Medical History  Diagnosis Date  . Atrial fibrillation   . Lumbosacral spondylosis without myelopathy   . Carotid artery disease     Followed by Dr. Scot Dock  . Insomnia, unspecified   . Esophageal reflux   . Fibrocystic disease of breast   . Osteoarthrosis, unspecified whether generalized or localized, unspecified site   . Urinary tract infection   . Arthritis   . Unspecified sinusitis (chronic)   . Orthostatic lightheadedness 10/20/2011  . Calculus of kidney     kidney stones  . Breast cancer 09/17/13    Left iNVASIVE DUCTAL,dcis  . Allergy     SURGICAL HISTORY: Past Surgical History  Procedure Laterality Date  . Dilation and curettage of uterus  1960  . Kidney stone surgery    . Varicose veins      SOCIAL HISTORY: History   Social History  . Marital Status: Married    Spouse Name: N/A    Number of Children: N/A  . Years of Education: N/A   Occupational History  . Not on file.   Social History Main Topics  . Smoking status: Never Smoker   . Smokeless tobacco: Never Used  . Alcohol Use: No  . Drug Use: No  . Sexual Activity: Not on file   Other Topics Concern  . Not on file   Social History Narrative   Non smoker/ no tobacco use   Current work/retired   Martial status/widowed   Non drinker/ no alcohol use  Seat belt use/ always wears seatbelt   Caffeine use   Guns in the home          FAMILY HISTORY: Family History  Problem Relation Age of Onset  . Heart disease Mother   . Asthma Mother   . Hypertension Mother   . Cancer Mother   . Deep vein thrombosis Mother   . Other Mother     varicose veins  . Heart disease Father   . Hypertension Father   . Stroke Father   . Heart disease Sister   . Hypertension Sister   . Hyperlipidemia Sister   . Heart disease Brother   . Asthma Brother   . Hypertension Brother   . Deep vein thrombosis  Brother   . Diabetes Brother   . Heart disease Other   . Hypertension Other   . Colon cancer Other     ALLERGIES:  is allergic to diltiazem hcl; flecainide; lopressor; nebivolol; and sodium pantothenate.  MEDICATIONS:  Current Outpatient Prescriptions  Medication Sig Dispense Refill  . calcium citrate-vitamin D (CITRACAL+D) 315-200 MG-UNIT per tablet Take 1 tablet by mouth daily.       . chlorthalidone (HYGROTON) 25 MG tablet Take 25 mg by mouth as needed.       . doxycycline (ADOXA) 50 MG tablet Take 50 mg by mouth as needed.       Marland Kitchen estradiol (ESTRACE) 0.1 MG/GM vaginal cream Place 2 g vaginally as needed.       . meclizine (ANTIVERT) 25 MG tablet Take 1 tablet (25 mg total) by mouth 3 (three) times daily as needed for dizziness.  90 tablet  2  . meloxicam (MOBIC) 15 MG tablet Take 15 mg by mouth as needed.      . metoprolol succinate (TOPROL-XL) 25 MG 24 hr tablet Take 1 tablet (25 mg total) by mouth daily.  90 tablet  1  . Multiple Vitamin (MULTIVITAMIN) capsule Take 1 capsule by mouth daily.        . Omega-3 Fatty Acids (FISH OIL) 1200 MG CAPS Take 2 capsules by mouth daily.      . Omega-3 Fatty Acids (SALMON OIL-1000) 200 MG CAPS Take 1 capsule by mouth daily with breakfast.      . omeprazole (PRILOSEC) 20 MG capsule Take 20 mg by mouth daily.        . Rivaroxaban (XARELTO) 20 MG TABS tablet Take 1 tablet (20 mg total) by mouth daily.  270 tablet  1  . traZODone (DESYREL) 50 MG tablet Take 50-100 mg by mouth at bedtime.       . vitamin C (ASCORBIC ACID) 500 MG tablet Take 500 mg by mouth daily.         No current facility-administered medications for this visit.    REVIEW OF SYSTEMS:   Constitutional: Denies fevers, chills or abnormal night sweats Eyes: Denies blurriness of vision, double vision or watery eyes Ears, nose, mouth, throat, and face: Denies mucositis or sore throat Respiratory: Denies cough, dyspnea or wheezes Cardiovascular: Denies palpitation, chest discomfort  or lower extremity swelling Gastrointestinal:  Denies nausea, heartburn or change in bowel habits Skin: Denies abnormal skin rashes Lymphatics: Denies new lymphadenopathy or easy bruising Neurological:Denies numbness, tingling or new weaknesses Behavioral/Psych: Mood is stable, no new changes  Breast: Slightly tender no palpable lumps All other systems were reviewed with the patient and are negative.  PHYSICAL EXAMINATION: ECOG PERFORMANCE STATUS: 0 - Asymptomatic  Filed Vitals:   10/04/13 1159  BP:  132/69  Pulse: 67  Temp: 97.4 F (36.3 C)  Resp: 18   Filed Weights   10/04/13 1159  Weight: 165 lb 8 oz (75.07 kg)    GENERAL:alert, no distress and comfortable SKIN: skin color, texture, turgor are normal, no rashes or significant lesions EYES: normal, conjunctiva are pink and non-injected, sclera clear OROPHARYNX:no exudate, no erythema and lips, buccal mucosa, and tongue normal  NECK: supple, thyroid normal size, non-tender, without nodularity LYMPH:  no palpable lymphadenopathy in the cervical, axillary or inguinal LUNGS: clear to auscultation and percussion with normal breathing effort HEART: regular rate & rhythm and no murmurs and no lower extremity edema ABDOMEN:abdomen soft, non-tender and normal bowel sounds Musculoskeletal:no cyanosis of digits and no clubbing  PSYCH: alert & oriented x 3 with fluent speech NEURO: no focal motor/sensory deficits BREAST: No palpable lumps or nodules the left breast has a site of biopsy visible at 12:00 position no palpable lymphadenopathy  LABORATORY DATA:   No results found for this basename: WBC, HGB, HCT, MCV, PLT   Lab Results  Component Value Date   NA 138 03/02/2011   K 4.3 03/02/2011   CL 103 03/02/2011   CO2 25 03/02/2011    RADIOGRAPHIC STUDIES: I have personally reviewed the radiological reports and agreed with the findings in the report.  ASSESSMENT AND PLAN:  Breast cancer-left, invasive ductal, ER/PR positive 1.  Left breast invasive ductal carcinoma with DCIS ER/PR positive HER-2 positive. T1 C. N0 M0 stage IA clinical staging. I discussed with her the pathology in great detail including the MRI reports. The mass itself is a 1 cm with additional area of abnormality standing 1.8 cm in total size. Being HER-2 positive, patient would benefit from chemotherapy plus Herceptin. I discussed with her both neoadjuvant chemotherapy and adjuvant chemotherapy options. I I will discuss with Dr. Zella Richer regarding the best approach for her surgically.  2. Given her elderly age of 65 and history of atrial fibrillation and her personal request to not received any chemotherapy that could result in loss of hair I recommended neoadjuvant chemotherapy with Abraxane Herceptin given weekly x12 weeks could be a reasonable approach for her cancer. This would be followed by surgery and then maintenance Herceptin to complete 1 year of therapy combined with antiestrogen therapy with aromatase inhibitor.  3. If she were to start neoadjuvant chemotherapy I will need to consult Dr. Zella Richer for a port placement, she will also need echocardiogram, and chemotherapy education. Counseled her extensively about Abraxane the risks and benefits of chemotherapy including the risks of neuropathy, hair thinning, mild risk of nausea, drop in blood counts, fatigue, loss of taste, anemia. Also counseled her about Herceptin including the decline in ejection fraction and the risk of cardiac failure. Patient understands that she will need to undergo echocardiograms every 6 months or if she becomes symptomatic more frequently.   All questions were answered. The patient knows to call the clinic with any problems, questions or concerns. I spent 55 minutes counseling the patient face to face. The total time spent in the appointment was 60 minutes and more than 50% was on counseling.     Rulon Eisenmenger, MD 10/04/2013 2:00 PM   Discussed with Dr.  Zella Richer because the tumor was only 1 cm in size patient would undergo surgery up front. He would also place a port at the same time and we will see her back following the surgery to discuss the pathology and adjuvant treatment options.

## 2013-10-04 NOTE — Progress Notes (Signed)
Location of Breast Cancer: Upper  Left Breast  Grade III,  Histology per Pathology Report: 09/17/2013: Left Breast Biopsy: Invasive Ductal Carcinoma, DCIS,grade III  Receptor Status: ER(  + ), PR ( + ), Her2-neu (   )  Did patient present with symptoms (if so, please note symptoms) or was this found on screening mammography?:   Past/Anticipated interventions by surgeon, if any:Dr. Rhunette Croft., [place] 09/26/13, breast conservation planned,with left partial mastectomy after radioactive seed localization and left axillary node bx  Past/Anticipated interventions by medical oncology, if any: Chemotherapy appt with Dr. Rulon Eisenmenger, MD 10/04/13 Lymphedema issues, if any: No Pain issues, if any:  tenderness  SAFETY ISSUES:  Prior radiation? No  Pacemaker/ICD? nO  Possible current pregnancy? N/A  Is the patient on methotrexate? No  Current Complaints / other details:  No children, menses age 21,took HRT prolonged period of time ,menopause between ages [age], D&C1960,A-Fib,, non smoker,no smokless tobacco, non alcohol or illicit drug use  Mother cancer,heart disease,HTN,,father heart disease,stroke,HTN,    Rebecca Eaton, RN 10/04/2013,8:40 AM

## 2013-10-04 NOTE — Progress Notes (Signed)
Office Notes created by Dr. Lindi Adie during office visit.  Copy to pt.  Original to scan.

## 2013-10-04 NOTE — Assessment & Plan Note (Signed)
1. Left breast invasive ductal carcinoma with DCIS ER/PR positive HER-2 positive. T1 C. N0 M0 stage IA clinical staging. I discussed with her the pathology in great detail including the MRI reports. The mass itself is a 1 cm with additional area of abnormality standing 1.8 cm in total size. Being HER-2 positive, patient would benefit from chemotherapy plus Herceptin. I discussed with her both neoadjuvant chemotherapy and adjuvant chemotherapy options. I I will discuss with Dr. Zella Richer regarding the best approach for her surgically.  2. Given her elderly age of 89 and history of atrial fibrillation and her personal request to not received any chemotherapy that could result in loss of hair I recommended neoadjuvant chemotherapy with Abraxane Herceptin given weekly x12 weeks could be a reasonable approach for her cancer. This would be followed by surgery and then maintenance Herceptin to complete 1 year of therapy combined with antiestrogen therapy with aromatase inhibitor.  3. If she were to start neoadjuvant chemotherapy I will need to consult Dr. Zella Richer for a port placement, echocardiogram, chemotherapy education. Counseled her extensively about Abraxane the risks and benefits of chemotherapy including the risks of neuropathy, hair thinning, mild risk of nausea, drop in blood counts, fatigue, loss of taste, anemia. Also counseled her about Herceptin including the decline in ejection fraction and the risk of cardiac failure. Patient understands that she will need to undergo echocardiograms every 6 months or if she becomes symptomatic more frequently.

## 2013-10-07 ENCOUNTER — Ambulatory Visit
Admission: RE | Admit: 2013-10-07 | Discharge: 2013-10-07 | Disposition: A | Discharge status: Home / Self Care | Payer: Medicare HMO | Source: Ambulatory Visit | Attending: Radiation Oncology | Admitting: Radiation Oncology

## 2013-10-07 ENCOUNTER — Telehealth: Payer: Self-pay

## 2013-10-07 ENCOUNTER — Encounter: Payer: Self-pay | Admitting: Radiation Oncology

## 2013-10-07 VITALS — BP 136/60 | HR 61 | Temp 98.0°F | Resp 20 | Ht 64.0 in | Wt 165.8 lb

## 2013-10-07 DIAGNOSIS — I4891 Unspecified atrial fibrillation: Secondary | ICD-10-CM | POA: Insufficient documentation

## 2013-10-07 DIAGNOSIS — C50919 Malignant neoplasm of unspecified site of unspecified female breast: Secondary | ICD-10-CM | POA: Insufficient documentation

## 2013-10-07 DIAGNOSIS — Z170 Estrogen receptor positive status [ER+]: Secondary | ICD-10-CM | POA: Diagnosis not present

## 2013-10-07 DIAGNOSIS — C50412 Malignant neoplasm of upper-outer quadrant of left female breast: Secondary | ICD-10-CM

## 2013-10-07 DIAGNOSIS — Z79899 Other long term (current) drug therapy: Secondary | ICD-10-CM | POA: Diagnosis not present

## 2013-10-07 DIAGNOSIS — Z510 Encounter for antineoplastic radiation therapy: Secondary | ICD-10-CM | POA: Insufficient documentation

## 2013-10-07 DIAGNOSIS — C50212 Malignant neoplasm of upper-inner quadrant of left female breast: Secondary | ICD-10-CM

## 2013-10-07 DIAGNOSIS — K219 Gastro-esophageal reflux disease without esophagitis: Secondary | ICD-10-CM | POA: Diagnosis not present

## 2013-10-07 DIAGNOSIS — C50912 Malignant neoplasm of unspecified site of left female breast: Secondary | ICD-10-CM

## 2013-10-07 HISTORY — DX: Allergy, unspecified, initial encounter: T78.40XA

## 2013-10-07 HISTORY — DX: Malignant neoplasm of unspecified site of unspecified female breast: C50.919

## 2013-10-07 NOTE — Progress Notes (Signed)
Please see the Nurse Progress Note in the MD Initial Consult Encounter for this patient. 

## 2013-10-07 NOTE — Telephone Encounter (Signed)
Let pt know surgery is to be done 1st and she should be expecting a call from [facility] to schedule.  Let her know we would see her after surgery.  Pt voiced understanding.

## 2013-10-08 ENCOUNTER — Encounter (INDEPENDENT_AMBULATORY_CARE_PROVIDER_SITE_OTHER): Payer: Self-pay | Admitting: General Surgery

## 2013-10-08 DIAGNOSIS — C50912 Malignant neoplasm of unspecified site of left female breast: Secondary | ICD-10-CM

## 2013-10-08 NOTE — Progress Notes (Signed)
Patient ID: Colleen Galloway, female   DOB: 02-24-1934, 79 y.o.   MRN: [id number] I spoke with Dr. Lindi Adie about her and also spoke with Ms. Enid Derry.  The plan is to proceed with the left breast partial mastectomy and left axillary sentinel lymph biopsy. Will also put a Port-A-Cath in as she will need Herceptin.  The procedure risks and of Port-A-Cath insertion have been explained. Risks include but are not limited to bleeding, infection, malfunction, pneumothorax, wound problems, DVT.  [facility] will need to [facility] given her history of paroxysmal atrial fibrillation. I have asked her to stop her Xarelto for 5 days prior to the surgery. Dr. Caryl Comes feels it is okay from a cardiac standpoint to proceed with the operation as well.

## 2013-10-09 DIAGNOSIS — C50219 Malignant neoplasm of upper-inner quadrant of unspecified female breast: Secondary | ICD-10-CM | POA: Insufficient documentation

## 2013-10-09 NOTE — Progress Notes (Signed)
Radiation Oncology         [phone]) [phone] ________________________________  Name: ADEA Galloway MRN: [id number]  Date: 10/07/2013  DOB: [birth year]  DP:OEUMPNT,IRWERXV E, MD  Galloway, Colleen Croft, MD     REFERRING PHYSICIAN: [name], MD   DIAGNOSIS: The encounter diagnosis was Breast cancer, left.   HISTORY OF PRESENT ILLNESS::Aamiyah L Galloway is a 79 y.o. female who is seen for an initial consultation visit. The patient is seen today regarding her new diagnosis of left-sided breast cancer. The patient underwent a 3-D mammogram which revealed a suspicious abnormality within the left breast. This led to an image guided biopsy which returned positive for invasive ductal carcinoma with associated DCIS. Receptor studies have indicated that the tumor is estrogen receptor positive, progesterone receptor positive, and HER-2/neu positive.  The patient proceeded to undergo an MRI scan of the breasts. This revealed an enhancing approximate 1.0 cm mass consistent with biopsy-proven left-sided breast cancer. There also was linear non-mass enhancement consistent with associated DCIS spanning 1.8 cm.  There is no evidence of malignancy elsewhere within the left breast. No pathologic lymphadenopathy.    the patient has been seen by Dr. Zella Richer Re: surgery and also has been seen by medical oncology.   PREVIOUS RADIATION THERAPY: No   PAST MEDICAL HISTORY:  has a past medical history of Atrial fibrillation; Lumbosacral spondylosis without myelopathy; Carotid artery disease; Insomnia, unspecified; Esophageal reflux; Fibrocystic disease of breast; Osteoarthrosis, unspecified whether generalized or localized, unspecified site; Urinary tract infection; Arthritis; Unspecified sinusitis (chronic); Orthostatic lightheadedness (10/20/2011); Calculus of kidney; Breast cancer (09/17/13); and Allergy.     PAST SURGICAL HISTORY: Past Surgical History  Procedure Laterality Date  . Dilation and curettage of  uterus  1960  . Kidney stone surgery    . Varicose veins       FAMILY HISTORY: family history includes Asthma in her brother and mother; Cancer in her mother; Colon cancer in her other; Deep vein thrombosis in her brother and mother; Diabetes in her brother; Heart disease in her brother, father, mother, other, and sister; Hyperlipidemia in her sister; Hypertension in her brother, father, mother, other, and sister; Other in her mother; Stroke in her father.   SOCIAL HISTORY:  reports that she has never smoked. She has never used smokeless tobacco. She reports that she does not drink alcohol or use illicit drugs.   ALLERGIES: Diltiazem hcl; Flecainide; Lopressor; Nebivolol; and Sodium pantothenate   MEDICATIONS:  Current Outpatient Prescriptions  Medication Sig Dispense Refill  . ALOE VERA REPLENISHING BODY GEL Apply 1 application topically as needed.      . calcium citrate-vitamin D (CITRACAL+D) 315-200 MG-UNIT per tablet Take 1 tablet by mouth daily.       . chlorthalidone (HYGROTON) 25 MG tablet Take 25 mg by mouth as needed.       . doxycycline (ADOXA) 50 MG tablet Take 50 mg by mouth as needed.       Marland Kitchen estradiol (ESTRACE) 0.1 MG/GM vaginal cream Place 2 g vaginally as needed.       . meclizine (ANTIVERT) 25 MG tablet Take 1 tablet (25 mg total) by mouth 3 (three) times daily as needed for dizziness.  90 tablet  2  . meloxicam (MOBIC) 15 MG tablet Take 15 mg by mouth as needed.      . metoprolol succinate (TOPROL-XL) 25 MG 24 hr tablet Take 1 tablet (25 mg total) by mouth daily.  90 tablet  1  . Multiple Vitamin (MULTIVITAMIN)  capsule Take 1 capsule by mouth daily.        . Omega-3 Fatty Acids (FISH OIL) 1200 MG CAPS Take 2 capsules by mouth daily.      . Omega-3 Fatty Acids (SALMON OIL-1000) 200 MG CAPS Take 1 capsule by mouth daily with breakfast.      . omeprazole (PRILOSEC) 20 MG capsule Take 20 mg by mouth daily.        . Rivaroxaban (XARELTO) 20 MG TABS tablet Take 1 tablet (20  mg total) by mouth daily.  270 tablet  1  . traZODone (DESYREL) 50 MG tablet Take 50-100 mg by mouth at bedtime.       . vitamin C (ASCORBIC ACID) 500 MG tablet Take 500 mg by mouth daily.         No current facility-administered medications for this encounter.     REVIEW OF SYSTEMS:  A 15 point review of systems is documented in the electronic medical record. This was obtained by the nursing staff. However, I reviewed this with the patient to discuss relevant findings and make appropriate changes.  Pertinent items are noted in HPI.    PHYSICAL EXAM:  height is $RemoveB'5\' 4"'FyzSjVtp$  (1.626 m) and weight is 165 lb 12.8 oz (75.206 kg). Her oral temperature is 98 F (36.7 C). Her blood pressure is 136/60 and her pulse is 61. Her respiration is 20.   ECOG = 1  0 - Asymptomatic (Fully active, able to carry on all predisease activities without restriction)  1 - Symptomatic but completely ambulatory (Restricted in physically strenuous activity but ambulatory and able to carry out work of a light or sedentary nature. For example, light housework, office work)  2 - Symptomatic, <50% in bed during the day (Ambulatory and capable of all self care but unable to carry out any work activities. Up and about more than 50% of waking hours)  3 - Symptomatic, >50% in bed, but not bedbound (Capable of only limited self-care, confined to bed or chair 50% or more of waking hours)  4 - Bedbound (Completely disabled. Cannot carry on any self-care. Totally confined to bed or chair)  5 - Death   Eustace Pen MM, Creech RH, Tormey DC, et al. [phone]). "Toxicity and response criteria of the [facility]". [place] Oncol. 5 (6): 649-55  General: Well-developed, in no acute distress HEENT: Normocephalic, atraumatic; oral cavity clear Neck: Supple without any lymphadenopathy Cardiovascular: Regular rate and rhythm Respiratory: Clear to auscultation bilaterally Breasts:  I do not palpate a clear tumor within the  (. No palpable axillary lymphadenopathy. Benign right-sided breast exam and no axillary adenopathy on this side GI: Soft, nontender, normal bowel sounds Extremities: No edema present Neuro: No focal deficits     LABORATORY DATA:  No results found for this basename: WBC, HGB, HCT, MCV, PLT   Lab Results  Component Value Date   NA 138 03/02/2011   K 4.3 03/02/2011   CL 103 03/02/2011   CO2 25 03/02/2011   No results found for this basename: ALT, AST, GGT, ALKPHOS, BILITOT      RADIOGRAPHY: Mr Breast Bilateral W Wo Contrast  09/27/2013   CLINICAL DATA:  Biopsy proven invasive ductal carcinoma and DCIS on recent stereotactic biopsy. MRI requested for surgical planning.  LABS:  BUN and creatinine were obtained on site at [facility] at [address]  Results:  BUN 14 mg/dL, Creatinine 0.7 mg/dL, estimated GFR 81.  EXAM: BILATERAL BREAST MRI WITH  AND WITHOUT CONTRAST  TECHNIQUE: Multiplanar, multisequence MR images of both breasts were obtained prior to and following the intravenous administration of 15 ml of MultiHance.  THREE-DIMENSIONAL MR IMAGE RENDERING ON INDEPENDENT WORKSTATION:  Three-dimensional MR images were rendered by post-processing of the original MR data on an independent workstation. The three-dimensional MR images were interpreted, and findings are reported in the following complete MRI report for this study. Three dimensional images were evaluated at the independent DynaCad workstation.  COMPARISON:  Mammography 09/17/2013 (left), 09/09/2013 (left), 09/02/2013 (bilateral), dating back to 08/06/2010. Prior imaging was performed at [facility].  FINDINGS: Breast composition: d.  Extreme fibroglandular tissue.  Background parenchymal enhancement: Moderate.  Right breast: No mass or abnormal enhancement.  Left breast: Approximate 1.0 x 0.7 cm enhancing mass with type 3 washout kinetics in the upper left breast between 11 o'clock and 12 o'clock, middle depth,  consistent with biopsy-proven invasive ductal carcinoma. This is associated with linear non mass enhancement with type 3 washout kinetics consistent with biopsy-proven DCIS which spans approximately 1.8 cm. No abnormal enhancement elsewhere in the left breast.  Lymph nodes: No abnormal appearing lymph nodes.  Ancillary findings:  None.  IMPRESSION: 1. Enhancing approximate 1.0 cm mass consistent with biopsy-proven invasive ductal carcinoma in the upper left breast, middle depth, in association with linear non mass enhancement consistent with biopsy-proven DCIS which spans 1.8 cm. 2. No evidence of malignancy elsewhere in the left breast. 3. No evidence of malignancy, right breast. 4. No pathologic lymphadenopathy.  RECOMMENDATION: Treatment plan.  BI-RADS CATEGORY  6: Known biopsy-proven malignancy.   Electronically Signed   By: Evangeline Dakin M.D.   On: 09/27/2013 11:40       IMPRESSION: The patient has a new diagnosis of left-sided breast cancer. Clinically this represents a T1 B. N0 M0 invasive ductal carcinoma of the left breast with associated ductal carcinoma in situ. The patient is felt to be a good candidate for breast conservation treatment. She also has discussed proceeding with neoadjuvant chemotherapy with medical oncology.  I believe that the patient is quite active and appears younger than her stated age to me today. I discussed this with her and its potential impact on treatment recommendations. We discussed the possibility of radiation treatment to improve local/regional control. We discussed the logistics of treatment. We also discussed the possible side effects and risks of treatment. All of her questions were answered.   PLAN:  I believe it would be reasonable to have the patient return to clinic postoperatively to review her information after that time and to make a final decision regarding radiation treatment. Currently, it appears that the patient is in a favorable clinical scenario  and it would be reasonable to consider foregoing radiation treatment. However, the patient has a HER-2/neu positive tumor and physiologically does appear younger than her stated age of 55. The patient today indicated a possible interest in proceeding with treatment after our discussion. If we did proceed with radiation treatment adjuvantly, then I would recommend a hypo-fractionated four-week course of radiation treatment to the left breast.     I spent 60 minutes face to face with the patient and more than 50% of that time was spent in counseling and/or coordination of care.    ________________________________   Jodelle Gross, MD, PhD   **Disclaimer: This note was dictated with voice recognition software. Similar sounding words can inadvertently be transcribed and this note may contain transcription errors which may not have been corrected upon publication  of note.**

## 2013-10-11 ENCOUNTER — Encounter (HOSPITAL_COMMUNITY): Payer: Self-pay | Admitting: Pharmacy Technician

## 2013-10-15 ENCOUNTER — Encounter (HOSPITAL_COMMUNITY)
Admission: RE | Admit: 2013-10-15 | Discharge: 2013-10-15 | Disposition: A | Discharge status: Home / Self Care | Payer: Medicare HMO | Source: Ambulatory Visit | Attending: General Surgery | Admitting: General Surgery

## 2013-10-15 ENCOUNTER — Encounter (HOSPITAL_COMMUNITY): Payer: Self-pay

## 2013-10-15 DIAGNOSIS — C50919 Malignant neoplasm of unspecified site of unspecified female breast: Secondary | ICD-10-CM | POA: Diagnosis present

## 2013-10-15 DIAGNOSIS — I10 Essential (primary) hypertension: Secondary | ICD-10-CM | POA: Diagnosis not present

## 2013-10-15 DIAGNOSIS — Z888 Allergy status to other drugs, medicaments and biological substances status: Secondary | ICD-10-CM | POA: Diagnosis not present

## 2013-10-15 DIAGNOSIS — G4700 Insomnia, unspecified: Secondary | ICD-10-CM | POA: Diagnosis not present

## 2013-10-15 DIAGNOSIS — K219 Gastro-esophageal reflux disease without esophagitis: Secondary | ICD-10-CM | POA: Diagnosis not present

## 2013-10-15 DIAGNOSIS — Z170 Estrogen receptor positive status [ER+]: Secondary | ICD-10-CM | POA: Diagnosis not present

## 2013-10-15 DIAGNOSIS — I4891 Unspecified atrial fibrillation: Secondary | ICD-10-CM | POA: Diagnosis not present

## 2013-10-15 DIAGNOSIS — I6529 Occlusion and stenosis of unspecified carotid artery: Secondary | ICD-10-CM | POA: Diagnosis not present

## 2013-10-15 DIAGNOSIS — M1990 Unspecified osteoarthritis, unspecified site: Secondary | ICD-10-CM | POA: Diagnosis not present

## 2013-10-15 DIAGNOSIS — N6019 Diffuse cystic mastopathy of unspecified breast: Secondary | ICD-10-CM | POA: Diagnosis not present

## 2013-10-15 DIAGNOSIS — M47817 Spondylosis without myelopathy or radiculopathy, lumbosacral region: Secondary | ICD-10-CM | POA: Diagnosis not present

## 2013-10-15 HISTORY — DX: Nausea with vomiting, unspecified: R11.2

## 2013-10-15 HISTORY — DX: Heart failure, unspecified: I50.9

## 2013-10-15 HISTORY — DX: Other specified postprocedural states: Z98.890

## 2013-10-15 HISTORY — DX: Other specified postprocedural states: R11.2

## 2013-10-15 LAB — COMPREHENSIVE METABOLIC PANEL
ALBUMIN: 3.9 g/dL (ref 3.5–5.2)
ALK PHOS: 92 U/L (ref 39–117)
ALT: 32 U/L (ref 0–35)
AST: 37 U/L (ref 0–37)
Anion gap: 12 (ref 5–15)
BUN: 12 mg/dL (ref 6–23)
CO2: 26 mEq/L (ref 19–32)
Calcium: 9.8 mg/dL (ref 8.4–10.5)
Chloride: 102 mEq/L (ref 96–112)
Creatinine, Ser: 0.81 mg/dL (ref 0.50–1.10)
GFR calc non Af Amer: 69 mL/min — ABNORMAL LOW (ref >90)
GFR, EST AFRICAN AMERICAN: 80 mL/min — AB (ref >90)
GLUCOSE: 95 mg/dL (ref 70–99)
POTASSIUM: 4.3 meq/L (ref 3.7–5.3)
SODIUM: 140 meq/L (ref 137–147)
TOTAL PROTEIN: 7.4 g/dL (ref 6.0–8.3)
Total Bilirubin: 0.4 mg/dL (ref 0.3–1.2)

## 2013-10-15 LAB — CBC WITH DIFFERENTIAL/PLATELET
BASOS ABS: 0 10*3/uL (ref 0.0–0.1)
Basophils Relative: 0 % (ref 0–1)
EOS ABS: 0 10*3/uL (ref 0.0–0.7)
Eosinophils Relative: 1 % (ref 0–5)
HCT: 42.2 % (ref 36.0–46.0)
Hemoglobin: 13.9 g/dL (ref 12.0–15.0)
LYMPHS ABS: 1.7 10*3/uL (ref 0.7–4.0)
LYMPHS PCT: 28 % (ref 12–46)
MCH: 30 pg (ref 26.0–34.0)
MCHC: 32.9 g/dL (ref 30.0–36.0)
MCV: 91.1 fL (ref 78.0–100.0)
Monocytes Absolute: 0.3 10*3/uL (ref 0.1–1.0)
Monocytes Relative: 5 % (ref 3–12)
NEUTROS PCT: 66 % (ref 43–77)
Neutro Abs: 4 10*3/uL (ref 1.7–7.7)
PLATELETS: 160 10*3/uL (ref 150–400)
RBC: 4.63 MIL/uL (ref 3.87–5.11)
RDW: 14.3 % (ref 11.5–15.5)
WBC: 6 10*3/uL (ref 4.0–10.5)

## 2013-10-15 LAB — PROTIME-INR
INR: 0.94 (ref 0.00–1.49)
Prothrombin Time: 12.6 seconds (ref 11.6–15.2)

## 2013-10-15 LAB — ABO/RH: ABO/RH(D): O POS

## 2013-10-15 LAB — TYPE AND SCREEN
ABO/RH(D): O POS
ANTIBODY SCREEN: NEGATIVE

## 2013-10-15 NOTE — Pre-Procedure Instructions (Signed)
TAMYA DENARDO  10/15/2013   Your procedure is scheduled on:  Friday, September 4th  Report to [facility] Admitting at 800 AM.  Call this number if you have problems the morning of surgery: [phone]   Remember:   Do not eat food or drink liquids after midnight.   Take these medicines the morning of surgery with A SIP OF WATER: toprol, prilosec  Stop taking aspirin, OTC vitamins/herbal medications, NSAIDS (ibuprofen, advil, motrin) as of today.  Xarelto as directed by your physician.    Do not wear jewelry, make-up or nail polish.  Do not wear lotions, powders, or perfume,deodorant.  Do not shave 48 hours prior to surgery. Men may shave face and neck.  Do not bring valuables to the hospital.  [facility] is not responsible for any belongings or valuables.               Contacts, dentures or bridgework may not be worn into surgery.  Leave suitcase in the car. After surgery it may be brought to your room.  For patients admitted to the hospital, discharge time is determined by your treatment team.               Patients discharged the day of surgery will not be allowed to drive home.  Please read over the following fact sheets that you were given: Pain Booklet, Coughing and Deep Breathing, Blood Transfusion Information and Surgical Site Infection Prevention [facility] - Preparing for Surgery  Before surgery, you can play an important role.  Because skin is not sterile, your skin needs to be as free of germs as possible.  You can reduce the number of germs on you skin by washing with CHG (chlorahexidine gluconate) soap before surgery.  CHG is an antiseptic cleaner which kills germs and bonds with the skin to continue killing germs even after washing.  Please DO NOT use if you have an allergy to CHG or antibacterial soaps.  If your skin becomes reddened/irritated stop using the CHG and inform your nurse when you arrive at Short Stay.  Do not shave (including legs and  underarms) for at least 48 hours prior to the first CHG shower.  You may shave your face.  Please follow these instructions carefully:   1.  Shower with CHG Soap the night before surgery and the morning of Surgery.  2.  If you choose to wash your hair, wash your hair first as usual with your normal shampoo.  3.  After you shampoo, rinse your hair and body thoroughly to remove the shampoo.  4.  Use CHG as you would any other liquid soap.  You can apply CHG directly to the skin and wash gently with scrungie or a clean washcloth.  5.  Apply the CHG Soap to your body ONLY FROM THE NECK DOWN.  Do not use on open wounds or open sores.  Avoid contact with your eyes, ears, mouth and genitals (private parts).  Wash genitals (private parts) with your normal soap.  6.  Wash thoroughly, paying special attention to the area where your surgery will be performed.  7.  Thoroughly rinse your body with warm water from the neck down.  8.  DO NOT shower/wash with your normal soap after using and rinsing off the CHG Soap.  9.  Pat yourself dry with a clean towel.            10.  Wear clean pajamas.  11.  Place clean sheets on your bed the night of your first shower and do not sleep with pets.  Day of Surgery  Do not apply any lotions/deoderants the morning of surgery.  Please wear clean clothes to the hospital/surgery center.

## 2013-10-16 ENCOUNTER — Other Ambulatory Visit: Payer: Self-pay

## 2013-10-16 NOTE — Progress Notes (Signed)
Anesthesia Chart Review:  Pt is 79 year old female posted for L partial mastectomy, L axillary sentinel node biopsy, ultrasound guided port-a-cath insertion on 10/18/13 by Dr. Zella Richer.   PMH: atrial fibrillation, CHF, carotid artery stenosis, breast cancer  Medications include: xarelto, chlorthalidone, metoprolol, antivert, mobic, trazodone, prilosec.   Preoperative labs reviewed.    Chest x-ray reviewed.  2D echo 11/02/10:  - Left ventricle: The cavity size was normal. Wall thickness was normal. Systolic function was normal. The estimated ejection fraction was in the range of 60% to 65%. - Tricuspid valve: Moderate regurgitation. - Pulmonary arteries: Systolic pressure was mildly increased. PA peak pressure: 74mm Hg (S).  Myoview stress test 11/02/10: Normal stress nuclear study. No evidence of ischemia. Normal LV function.  EKG 07/23/13: NSR with sinus arrhythmia, nonspecific ST and T wave abnormality  Note by Dr. Zella Richer 10/08/13 states he spoke with pt's cardiologist, Dr. Caryl Comes, who feels proceeding with surgery is ok from cardiac standpoint. Dr. Zella Richer also notes pt is to stop xarelto 5 days prior to surgery.    If no changes, I anticipate pt can proceed with surgery as scheduled.    Willeen Cass, FNP-BC [facility] Short Stay Surgical Center/Anesthesiology Phone: [phone] 10/16/2013 3:12 PM

## 2013-10-17 MED ORDER — CEFAZOLIN SODIUM-DEXTROSE 2-3 GM-% IV SOLR
2.0000 g | INTRAVENOUS | Status: DC
  Filled 2013-10-17: qty 50

## 2013-10-17 NOTE — Anesthesia Preprocedure Evaluation (Addendum)
Anesthesia Evaluation  Patient identified by MRN, date of birth, ID band Patient awake    Reviewed: Allergy & Precautions, H&P , NPO status   History of Anesthesia Complications (+) PONV  Airway Mallampati: II TM Distance: >3 FB     Dental  (+) Partial Lower, Dental Advisory Given   Pulmonary  breath sounds clear to auscultation        Cardiovascular hypertension, Pt. on medications + dysrhythmias (Nsr onEKG 10/2013  hadhx of AF) Rhythm:Regular Rate:Normal  Ef 60% 2013   Neuro/Psych R CAROTID 60-705 stenosis, L carotid clear    GI/Hepatic GERD-  ,  Endo/Other    Renal/GU Renal disease: Gfr 70.     Musculoskeletal   Abdominal (+)  Abdomen: soft.    Peds  Hematology   Anesthesia Other Findings   Reproductive/Obstetrics                        Anesthesia Physical Anesthesia Plan  ASA: III  Anesthesia Plan: General   Post-op Pain Management:    Induction: Intravenous  Airway Management Planned: Oral ETT  Additional Equipment:   Intra-op Plan:   Post-operative Plan:   Informed Consent: I have reviewed the patients History and Physical, chart, labs and discussed the procedure including the risks, benefits and alternatives for the proposed anesthesia with the patient or authorized representative who has indicated his/her understanding and acceptance.     Plan Discussed with:   Anesthesia Plan Comments:         Anesthesia Quick Evaluation

## 2013-10-18 ENCOUNTER — Ambulatory Visit (HOSPITAL_COMMUNITY): Payer: Medicare HMO

## 2013-10-18 ENCOUNTER — Encounter (HOSPITAL_COMMUNITY)
Admission: RE | Disposition: A | Discharge status: Home / Self Care | Payer: Self-pay | Source: Ambulatory Visit | Attending: General Surgery

## 2013-10-18 ENCOUNTER — Other Ambulatory Visit: Payer: Self-pay

## 2013-10-18 ENCOUNTER — Encounter (HOSPITAL_COMMUNITY)
Admission: RE | Admit: 2013-10-18 | Discharge: 2013-10-18 | Disposition: A | Discharge status: Home / Self Care | Payer: Medicare HMO | Source: Ambulatory Visit | Attending: General Surgery | Admitting: General Surgery

## 2013-10-18 ENCOUNTER — Ambulatory Visit (HOSPITAL_COMMUNITY)
Admission: RE | Admit: 2013-10-18 | Discharge: 2013-10-18 | Disposition: A | Discharge status: Home / Self Care | Payer: Medicare HMO | Source: Ambulatory Visit | Attending: General Surgery | Admitting: General Surgery

## 2013-10-18 ENCOUNTER — Encounter (HOSPITAL_COMMUNITY): Payer: Medicare HMO | Admitting: Emergency Medicine

## 2013-10-18 ENCOUNTER — Encounter (HOSPITAL_COMMUNITY): Payer: Self-pay | Admitting: Anesthesiology

## 2013-10-18 ENCOUNTER — Ambulatory Visit (HOSPITAL_COMMUNITY): Payer: Medicare HMO | Admitting: Anesthesiology

## 2013-10-18 DIAGNOSIS — K219 Gastro-esophageal reflux disease without esophagitis: Secondary | ICD-10-CM | POA: Insufficient documentation

## 2013-10-18 DIAGNOSIS — M47817 Spondylosis without myelopathy or radiculopathy, lumbosacral region: Secondary | ICD-10-CM | POA: Diagnosis not present

## 2013-10-18 DIAGNOSIS — G4700 Insomnia, unspecified: Secondary | ICD-10-CM | POA: Insufficient documentation

## 2013-10-18 DIAGNOSIS — I4891 Unspecified atrial fibrillation: Secondary | ICD-10-CM | POA: Diagnosis not present

## 2013-10-18 DIAGNOSIS — C50219 Malignant neoplasm of upper-inner quadrant of unspecified female breast: Secondary | ICD-10-CM | POA: Diagnosis not present

## 2013-10-18 DIAGNOSIS — C50919 Malignant neoplasm of unspecified site of unspecified female breast: Secondary | ICD-10-CM | POA: Diagnosis not present

## 2013-10-18 DIAGNOSIS — N6019 Diffuse cystic mastopathy of unspecified breast: Secondary | ICD-10-CM | POA: Insufficient documentation

## 2013-10-18 DIAGNOSIS — C50912 Malignant neoplasm of unspecified site of left female breast: Secondary | ICD-10-CM

## 2013-10-18 DIAGNOSIS — M1990 Unspecified osteoarthritis, unspecified site: Secondary | ICD-10-CM | POA: Insufficient documentation

## 2013-10-18 DIAGNOSIS — I6529 Occlusion and stenosis of unspecified carotid artery: Secondary | ICD-10-CM | POA: Insufficient documentation

## 2013-10-18 DIAGNOSIS — Z170 Estrogen receptor positive status [ER+]: Secondary | ICD-10-CM | POA: Diagnosis not present

## 2013-10-18 DIAGNOSIS — Z888 Allergy status to other drugs, medicaments and biological substances status: Secondary | ICD-10-CM | POA: Insufficient documentation

## 2013-10-18 DIAGNOSIS — I10 Essential (primary) hypertension: Secondary | ICD-10-CM | POA: Insufficient documentation

## 2013-10-18 HISTORY — PX: BREAST LUMPECTOMY WITH NEEDLE LOCALIZATION AND AXILLARY SENTINEL LYMPH NODE BX: SHX5760

## 2013-10-18 HISTORY — PX: PORTACATH PLACEMENT: SHX2246

## 2013-10-18 SURGERY — BREAST LUMPECTOMY WITH NEEDLE LOCALIZATION AND AXILLARY SENTINEL LYMPH NODE BX
Anesthesia: General | Site: Breast

## 2013-10-18 MED ORDER — EPHEDRINE SULFATE 50 MG/ML IJ SOLN
INTRAMUSCULAR | Status: DC | PRN
  Administered 2013-10-18 (×5): 10 mg via INTRAVENOUS

## 2013-10-18 MED ORDER — FENTANYL CITRATE 0.05 MG/ML IJ SOLN
INTRAMUSCULAR | Status: AC
  Filled 2013-10-18: qty 2

## 2013-10-18 MED ORDER — PHENYLEPHRINE HCL 10 MG/ML IJ SOLN
INTRAMUSCULAR | Status: DC | PRN
  Administered 2013-10-18: 80 ug via INTRAVENOUS
  Administered 2013-10-18: 20 ug via INTRAVENOUS
  Administered 2013-10-18 (×2): 80 ug via INTRAVENOUS
  Administered 2013-10-18: 20 ug via INTRAVENOUS
  Administered 2013-10-18: 40 ug via INTRAVENOUS
  Administered 2013-10-18: 80 ug via INTRAVENOUS

## 2013-10-18 MED ORDER — SODIUM CHLORIDE 0.9 % IJ SOLN
INTRAMUSCULAR | Status: AC
  Filled 2013-10-18: qty 10

## 2013-10-18 MED ORDER — ONDANSETRON HCL 4 MG/2ML IJ SOLN
INTRAMUSCULAR | Status: AC
  Filled 2013-10-18: qty 2

## 2013-10-18 MED ORDER — 0.9 % SODIUM CHLORIDE (POUR BTL) OPTIME
TOPICAL | Status: DC | PRN
  Administered 2013-10-18: 1000 mL

## 2013-10-18 MED ORDER — HYDROCODONE-ACETAMINOPHEN 5-325 MG PO TABS
ORAL_TABLET | ORAL | Status: AC
  Administered 2013-10-18: 1
  Filled 2013-10-18: qty 1

## 2013-10-18 MED ORDER — LIDOCAINE HCL (CARDIAC) 20 MG/ML IV SOLN
INTRAVENOUS | Status: DC | PRN
  Administered 2013-10-18: 100 mg via INTRAVENOUS

## 2013-10-18 MED ORDER — ACETAMINOPHEN 10 MG/ML IV SOLN
INTRAVENOUS | Status: DC | PRN
  Administered 2013-10-18: 1000 mg via INTRAVENOUS

## 2013-10-18 MED ORDER — ROCURONIUM BROMIDE 50 MG/5ML IV SOLN
INTRAVENOUS | Status: AC
  Filled 2013-10-18: qty 1

## 2013-10-18 MED ORDER — DEXAMETHASONE SODIUM PHOSPHATE 4 MG/ML IJ SOLN
INTRAMUSCULAR | Status: AC
  Filled 2013-10-18: qty 1

## 2013-10-18 MED ORDER — BUPIVACAINE HCL (PF) 0.5 % IJ SOLN
INTRAMUSCULAR | Status: DC | PRN
  Administered 2013-10-18: 15 mL

## 2013-10-18 MED ORDER — ONDANSETRON HCL 4 MG/2ML IJ SOLN
INTRAMUSCULAR | Status: DC | PRN
  Administered 2013-10-18: 4 mg via INTRAVENOUS

## 2013-10-18 MED ORDER — HYDROCODONE-ACETAMINOPHEN 5-325 MG PO TABS: 1.0000 | ORAL_TABLET | ORAL | Status: DC | PRN

## 2013-10-18 MED ORDER — FENTANYL CITRATE 0.05 MG/ML IJ SOLN
25.0000 ug | INTRAMUSCULAR | Status: DC | PRN
  Administered 2013-10-18 (×2): 50 ug via INTRAVENOUS

## 2013-10-18 MED ORDER — MEPERIDINE HCL 25 MG/ML IJ SOLN: 6.2500 mg | INTRAMUSCULAR | Status: DC | PRN

## 2013-10-18 MED ORDER — SCOPOLAMINE 1 MG/3DAYS TD PT72
1.0000 | MEDICATED_PATCH | TRANSDERMAL | Status: DC
  Administered 2013-10-18: 1.5 mg via TRANSDERMAL
  Filled 2013-10-18: qty 1

## 2013-10-18 MED ORDER — PROPOFOL 10 MG/ML IV BOLUS
INTRAVENOUS | Status: AC
  Filled 2013-10-18: qty 20

## 2013-10-18 MED ORDER — DEXAMETHASONE SODIUM PHOSPHATE 4 MG/ML IJ SOLN
INTRAMUSCULAR | Status: DC | PRN
  Administered 2013-10-18: 4 mg via INTRAVENOUS

## 2013-10-18 MED ORDER — EPHEDRINE SULFATE 50 MG/ML IJ SOLN
INTRAMUSCULAR | Status: AC
  Filled 2013-10-18: qty 1

## 2013-10-18 MED ORDER — HEPARIN SOD (PORK) LOCK FLUSH 100 UNIT/ML IV SOLN
INTRAVENOUS | Status: AC
  Filled 2013-10-18: qty 5

## 2013-10-18 MED ORDER — LIDOCAINE HCL (PF) 1 % IJ SOLN
INTRAMUSCULAR | Status: AC
  Filled 2013-10-18: qty 30

## 2013-10-18 MED ORDER — SODIUM CHLORIDE 0.9 % IJ SOLN
INTRAMUSCULAR | Status: DC | PRN
  Administered 2013-10-18: 11:00:00 via INTRAMUSCULAR

## 2013-10-18 MED ORDER — LIDOCAINE HCL (CARDIAC) 20 MG/ML IV SOLN
INTRAVENOUS | Status: AC
  Filled 2013-10-18: qty 5

## 2013-10-18 MED ORDER — BUPIVACAINE HCL (PF) 0.25 % IJ SOLN
INTRAMUSCULAR | Status: AC
  Filled 2013-10-18: qty 30

## 2013-10-18 MED ORDER — FENTANYL CITRATE 0.05 MG/ML IJ SOLN
INTRAMUSCULAR | Status: AC
  Filled 2013-10-18: qty 5

## 2013-10-18 MED ORDER — ACETAMINOPHEN 10 MG/ML IV SOLN
INTRAVENOUS | Status: AC
  Filled 2013-10-18: qty 100

## 2013-10-18 MED ORDER — LACTATED RINGERS IV SOLN
INTRAVENOUS | Status: DC | PRN
  Administered 2013-10-18 (×2): via INTRAVENOUS

## 2013-10-18 MED ORDER — METHYLENE BLUE 1 % INJ SOLN
INTRAMUSCULAR | Status: AC
  Filled 2013-10-18: qty 10

## 2013-10-18 MED ORDER — HEPARIN SOD (PORK) LOCK FLUSH 100 UNIT/ML IV SOLN
INTRAVENOUS | Status: DC | PRN
  Administered 2013-10-18: 200 [IU] via INTRAVENOUS

## 2013-10-18 MED ORDER — PROPOFOL 10 MG/ML IV BOLUS
INTRAVENOUS | Status: DC | PRN
  Administered 2013-10-18: 100 mg via INTRAVENOUS

## 2013-10-18 MED ORDER — MIDAZOLAM HCL 2 MG/2ML IJ SOLN
INTRAMUSCULAR | Status: AC
  Filled 2013-10-18: qty 2

## 2013-10-18 MED ORDER — TECHNETIUM TC 99M SULFUR COLLOID FILTERED
1.0000 | Freq: Once | INTRAVENOUS | Status: AC | PRN
  Administered 2013-10-18: 1 via INTRADERMAL

## 2013-10-18 MED ORDER — MIDAZOLAM HCL 5 MG/5ML IJ SOLN
INTRAMUSCULAR | Status: DC | PRN
  Administered 2013-10-18: 2 mg via INTRAVENOUS

## 2013-10-18 MED ORDER — PROMETHAZINE HCL 25 MG/ML IJ SOLN: 6.2500 mg | INTRAMUSCULAR | Status: DC | PRN

## 2013-10-18 MED ORDER — SODIUM CHLORIDE 0.9 % IR SOLN
Status: DC | PRN
  Administered 2013-10-18: 10:00:00

## 2013-10-18 MED ORDER — FENTANYL CITRATE 0.05 MG/ML IJ SOLN
INTRAMUSCULAR | Status: DC | PRN
  Administered 2013-10-18 (×3): 50 ug via INTRAVENOUS
  Administered 2013-10-18: 25 ug via INTRAVENOUS
  Administered 2013-10-18: 75 ug via INTRAVENOUS

## 2013-10-18 SURGICAL SUPPLY — 86 items
APL SKNCLS STERI-STRIP NONHPOA (GAUZE/BANDAGES/DRESSINGS) ×4
BAG DECANTER FOR FLEXI CONT (MISCELLANEOUS) ×4 IMPLANT
BENZOIN TINCTURE PRP APPL 2/3 (GAUZE/BANDAGES/DRESSINGS) ×6 IMPLANT
BINDER BREAST LRG (GAUZE/BANDAGES/DRESSINGS) IMPLANT
BINDER BREAST XLRG (GAUZE/BANDAGES/DRESSINGS) ×2 IMPLANT
CANISTER SUCTION 2500CC (MISCELLANEOUS) ×4 IMPLANT
CHLORAPREP W/TINT 26ML (MISCELLANEOUS) ×4 IMPLANT
CLOSURE STERI-STRIP 1/2X4 (GAUZE/BANDAGES/DRESSINGS) ×2
CLOSURE WOUND 1/2 X4 (GAUZE/BANDAGES/DRESSINGS) ×1
CLOSURE WOUND 1/4X4 (GAUZE/BANDAGES/DRESSINGS) ×1
CLSR STERI-STRIP ANTIMIC 1/2X4 (GAUZE/BANDAGES/DRESSINGS) ×2 IMPLANT
CONT SPEC 4OZ CLIKSEAL STRL BL (MISCELLANEOUS) ×4 IMPLANT
COVER PROBE W GEL 5X96 (DRAPES) ×4 IMPLANT
COVER SURGICAL LIGHT HANDLE (MISCELLANEOUS) ×4 IMPLANT
COVER TABLE BACK 60X90 (DRAPES) ×2 IMPLANT
COVER TRANSDUCER ULTRASND GEL (DRAPE) ×4 IMPLANT
CRADLE DONUT ADULT HEAD (MISCELLANEOUS) ×4 IMPLANT
DECANTER SPIKE VIAL GLASS SM (MISCELLANEOUS) ×4 IMPLANT
DEVICE DUBIN SPECIMEN MAMMOGRA (MISCELLANEOUS) ×2 IMPLANT
DRAPE C-ARM 42X72 X-RAY (DRAPES) ×4 IMPLANT
DRAPE LAPAROSCOPIC ABDOMINAL (DRAPES) ×4 IMPLANT
DRAPE PED LAPAROTOMY (DRAPES) ×4 IMPLANT
DRAPE UTILITY 15X26 W/TAPE STR (DRAPE) ×8 IMPLANT
DRSG OPSITE 4X5.5 SM (GAUZE/BANDAGES/DRESSINGS) ×4 IMPLANT
DRSG TEGADERM 2-3/8X2-3/4 SM (GAUZE/BANDAGES/DRESSINGS) ×2 IMPLANT
ELECT CAUTERY BLADE 6.4 (BLADE) ×6 IMPLANT
ELECT REM PT RETURN 9FT ADLT (ELECTROSURGICAL) ×4
ELECTRODE REM PT RTRN 9FT ADLT (ELECTROSURGICAL) ×2 IMPLANT
GAUZE SPONGE 2X2 8PLY STRL LF (GAUZE/BANDAGES/DRESSINGS) ×2 IMPLANT
GAUZE SPONGE 4X4 12PLY STRL (GAUZE/BANDAGES/DRESSINGS) ×4 IMPLANT
GAUZE SPONGE 4X4 16PLY XRAY LF (GAUZE/BANDAGES/DRESSINGS) ×4 IMPLANT
GLOVE BIOGEL PI IND STRL 6.5 (GLOVE) IMPLANT
GLOVE BIOGEL PI IND STRL 7.0 (GLOVE) IMPLANT
GLOVE BIOGEL PI IND STRL 8 (GLOVE) ×2 IMPLANT
GLOVE BIOGEL PI INDICATOR 6.5 (GLOVE) ×2
GLOVE BIOGEL PI INDICATOR 7.0 (GLOVE) ×2
GLOVE BIOGEL PI INDICATOR 8 (GLOVE) ×2
GLOVE ECLIPSE 8.0 STRL XLNG CF (GLOVE) ×4 IMPLANT
GLOVE SURG SS PI 6.5 STRL IVOR (GLOVE) ×4 IMPLANT
GOWN STRL REUS W/ TWL LRG LVL3 (GOWN DISPOSABLE) ×4 IMPLANT
GOWN STRL REUS W/TWL LRG LVL3 (GOWN DISPOSABLE) ×8
INTRODUCER 13FR (MISCELLANEOUS) IMPLANT
INTRODUCER COOK 11FR (CATHETERS) IMPLANT
KIT BASIN OR (CUSTOM PROCEDURE TRAY) ×4 IMPLANT
KIT PORT POWER 8FR ISP CVUE (Catheter) IMPLANT
KIT PORT POWER 9.6FR MRI PREA (Catheter) IMPLANT
KIT PORT POWER ISP 8FR (Catheter) ×2 IMPLANT
KIT POWER CATH 8FR (Catheter) IMPLANT
KIT ROOM TURNOVER OR (KITS) ×4 IMPLANT
MARKER SKIN DUAL TIP RULER LAB (MISCELLANEOUS) ×2 IMPLANT
NDL 18GX1X1/2 (RX/OR ONLY) (NEEDLE) IMPLANT
NDL HYPO 25GX1X1/2 BEV (NEEDLE) ×2 IMPLANT
NEEDLE 18GX1X1/2 (RX/OR ONLY) (NEEDLE) ×4 IMPLANT
NEEDLE 22X1 1/2 (OR ONLY) (NEEDLE) ×4 IMPLANT
NEEDLE HYPO 25GX1X1/2 BEV (NEEDLE) ×4 IMPLANT
NS IRRIG 1000ML POUR BTL (IV SOLUTION) ×4 IMPLANT
PACK SURGICAL SETUP 50X90 (CUSTOM PROCEDURE TRAY) ×4 IMPLANT
PAD ABD 8X10 STRL (GAUZE/BANDAGES/DRESSINGS) ×2 IMPLANT
PAD ARMBOARD 7.5X6 YLW CONV (MISCELLANEOUS) ×8 IMPLANT
PENCIL BUTTON HOLSTER BLD 10FT (ELECTRODE) ×6 IMPLANT
SET INTRODUCER 12FR PACEMAKER (SHEATH) IMPLANT
SET SHEATH INTRODUCER 10FR (MISCELLANEOUS) IMPLANT
SHEATH COOK PEEL AWAY SET 9F (SHEATH) IMPLANT
SPONGE GAUZE 2X2 STER 10/PKG (GAUZE/BANDAGES/DRESSINGS) ×2
SPONGE GAUZE 4X4 12PLY STER LF (GAUZE/BANDAGES/DRESSINGS) ×2 IMPLANT
SPONGE LAP 4X18 X RAY DECT (DISPOSABLE) ×4 IMPLANT
STRIP CLOSURE SKIN 1/2X4 (GAUZE/BANDAGES/DRESSINGS) ×3 IMPLANT
STRIP CLOSURE SKIN 1/4X4 (GAUZE/BANDAGES/DRESSINGS) ×3 IMPLANT
SUT MNCRL AB 4-0 PS2 18 (SUTURE) ×6 IMPLANT
SUT MON AB 4-0 PC3 18 (SUTURE) ×4 IMPLANT
SUT SILK 3 0 SH 30 (SUTURE) ×2 IMPLANT
SUT VIC AB 2-0 SH 18 (SUTURE) ×6 IMPLANT
SUT VIC AB 3-0 SH 18 (SUTURE) ×4 IMPLANT
SUT VIC AB 3-0 SH 27 (SUTURE) ×4
SUT VIC AB 3-0 SH 27X BRD (SUTURE) IMPLANT
SUT VIC AB 3-0 SH 8-18 (SUTURE) ×2 IMPLANT
SYR 20ML ECCENTRIC (SYRINGE) ×8 IMPLANT
SYR 5ML LUER SLIP (SYRINGE) ×4 IMPLANT
SYR CONTROL 10ML LL (SYRINGE) ×4 IMPLANT
TAPE PAPER 3X10 WHT MICROPORE (GAUZE/BANDAGES/DRESSINGS) ×2 IMPLANT
TOWEL OR 17X24 6PK STRL BLUE (TOWEL DISPOSABLE) ×4 IMPLANT
TOWEL OR 17X26 10 PK STRL BLUE (TOWEL DISPOSABLE) ×4 IMPLANT
TUBE CONNECTING 12'X1/4 (SUCTIONS) ×1
TUBE CONNECTING 12X1/4 (SUCTIONS) ×3 IMPLANT
WATER STERILE IRR 1000ML POUR (IV SOLUTION) IMPLANT
YANKAUER SUCT BULB TIP NO VENT (SUCTIONS) ×4 IMPLANT

## 2013-10-18 NOTE — Anesthesia Postprocedure Evaluation (Signed)
  Anesthesia Post-op Note  Patient: Colleen Galloway  Procedure(s) Performed: Procedure(s): LEFT AXILLARY LYMPHATIC MAPPING; INJECTION OF METHYLENE BLUE INTO LEFT BREAST; LEFT BREAST PARTIAL MASTECTOMY AFTER NEEDLE LOCALIZATION; AXILLARY SENTINEL LYMPH NODE BIOPSY (Left) INSERTION PORT-A-CATH/ULTRASOUND GUIDED,RIGHT INTERNAL JUGULAR (N/A)  Patient Location: PACU  Anesthesia Type:General  Level of Consciousness: awake, alert  and oriented  Airway and Oxygen Therapy: Patient Spontanous Breathing and Patient connected to nasal cannula oxygen  Post-op Pain: mild  Post-op Assessment: Post-op Vital signs reviewed, Patient's Cardiovascular Status Stable and Respiratory Function Stable  Post-op Vital Signs: Reviewed and stable  Last Vitals:  Filed Vitals:   10/18/13 1258  BP:   Pulse:   Temp: 36.9 C  Resp:     Complications: No apparent anesthesia complications

## 2013-10-18 NOTE — Anesthesia Procedure Notes (Signed)
Procedure Name: LMA Insertion Date/Time: 10/18/2013 10:31 AM Performed by: Rush Farmer E Pre-anesthesia Checklist: Patient identified, Emergency Drugs available, Suction available, Patient being monitored and Timeout performed Patient Re-evaluated:Patient Re-evaluated prior to inductionOxygen Delivery Method: Circle system utilized Preoxygenation: Pre-oxygenation with 100% oxygen Intubation Type: IV induction LMA: LMA inserted LMA Size: 4.0 Number of attempts: 1 Placement Confirmation: positive ETCO2 and breath sounds checked- equal and bilateral Tube secured with: Tape Dental Injury: Teeth and Oropharynx as per pre-operative assessment

## 2013-10-18 NOTE — H&P (View-Only) (Signed)
Patient ID: Colleen Galloway, female   DOB: 1934-04-28, 79 y.o.   MRN: [id number]  Chief Complaint  Patient presents with  . breast cancer    HPI Colleen Galloway is a 79 y.o. female.   HPI  She is referred by Dr. Marcelo Baldy because of a recent diagnosis of invasive left breast cancer. ER/PR are positive. She had an area asymmetry noted on one view on a screening left mammogram. It ended up being approximately 1 cm in size. Image guided biopsy demonstrated the above pathology. There is no family history of breast ancer. There is no personal history of breast cancer. Age at menarche was 24. She has not had any children. She has taken hormone replacement treatment for a prolonged period. She was menopausal somewhere between the ages of 32 and 50.  She is going to have a breast MRI tomorrow.  Past Medical History  Diagnosis Date  . Atrial fibrillation   . Lumbosacral spondylosis without myelopathy   . Carotid artery disease     Followed by Dr. Scot Dock  . Insomnia, unspecified   . Esophageal reflux   . Fibrocystic disease of breast   . Osteoarthrosis, unspecified whether generalized or localized, unspecified site   . Urinary tract infection   . Arthritis   . Unspecified sinusitis (chronic)   . Orthostatic lightheadedness 10/20/2011  . Calculus of kidney     kidney stones    Past Surgical History  Procedure Laterality Date  . Dilation and curettage of uterus  1960  . Kidney stone surgery    . Varicose veins      Family History  Problem Relation Age of Onset  . Heart disease Mother   . Asthma Mother   . Hypertension Mother   . Cancer Mother   . Deep vein thrombosis Mother   . Other Mother     varicose veins  . Heart disease Father   . Hypertension Father   . Stroke Father   . Heart disease Sister   . Hypertension Sister   . Hyperlipidemia Sister   . Heart disease Brother   . Asthma Brother   . Hypertension Brother   . Deep vein thrombosis Brother   . Diabetes Brother   . Heart  disease Other   . Hypertension Other   . Colon cancer Other     Social History History  Substance Use Topics  . Smoking status: Never Smoker   . Smokeless tobacco: Never Used  . Alcohol Use: No    Allergies  Allergen Reactions  . Diltiazem Hcl     Pt states it causes her ankle swelling   . Flecainide Other (See Comments)    Sob, visual problems, swelling ankles  . Lopressor [Metoprolol Tartrate]     Severe dizziness  . Nebivolol Swelling    Sob, visual problems, swelling in ankles  . Sodium Pantothenate     Current Outpatient Prescriptions  Medication Sig Dispense Refill  . calcium citrate-vitamin D (CITRACAL+D) 315-200 MG-UNIT per tablet Take 1 tablet by mouth daily.       . chlorthalidone (HYGROTON) 25 MG tablet Take 25 mg by mouth as needed.       . doxycycline (ADOXA) 50 MG tablet Take 50 mg by mouth as needed.       Marland Kitchen estradiol (ESTRACE) 0.1 MG/GM vaginal cream Place 2 g vaginally as needed.       . meclizine (ANTIVERT) 25 MG tablet Take 1 tablet (25 mg total) by mouth 3 (  three) times daily as needed for dizziness.  90 tablet  2  . meloxicam (MOBIC) 15 MG tablet Take 15 mg by mouth as needed.      . metoprolol succinate (TOPROL-XL) 25 MG 24 hr tablet Take 1 tablet (25 mg total) by mouth daily.  90 tablet  1  . Multiple Vitamin (MULTIVITAMIN) capsule Take 1 capsule by mouth daily.        . Omega-3 Fatty Acids (FISH OIL) 1200 MG CAPS Take 2 capsules by mouth daily.      . Omega-3 Fatty Acids (SALMON OIL-1000) 200 MG CAPS Take 1 capsule by mouth daily with breakfast.      . omeprazole (PRILOSEC) 20 MG capsule Take 20 mg by mouth daily.        . Rivaroxaban (XARELTO) 20 MG TABS tablet Take 1 tablet (20 mg total) by mouth daily.  270 tablet  1  . traZODone (DESYREL) 50 MG tablet Take 50-100 mg by mouth at bedtime.       . vitamin C (ASCORBIC ACID) 500 MG tablet Take 500 mg by mouth daily.        . midodrine (PROAMATINE) 2.5 MG tablet TAKE 1 TABLET  BY MOUTH 3  TIMES DAILY.   180 tablet  1  . simvastatin (ZOCOR) 40 MG tablet Take 40 mg by mouth at bedtime.        No current facility-administered medications for this visit.    Review of Systems Review of Systems  Constitutional: Negative.   HENT: Negative.   Respiratory: Negative.   Cardiovascular: Positive for palpitations.  Gastrointestinal: Negative.   Genitourinary: Negative.   Musculoskeletal: Positive for arthralgias.  Neurological: Negative.   Hematological: Negative.     Blood pressure 160/80, pulse 64, temperature 97.9 F (36.6 C), temperature source Oral, resp. rate 18, height 5\' 4"  (1.626 m), weight 164 lb 6.4 oz (74.571 kg).  Physical Exam Physical Exam  Constitutional: She appears well-developed and well-nourished. No distress.  HENT:  Head: Normocephalic and atraumatic.  Eyes: EOM are normal. No scleral icterus.  Neck: Neck supple.  Cardiovascular: Normal rate and regular rhythm.   Pulmonary/Chest: Effort normal and breath sounds normal.  The breasts are large and symmetrical in size.  There are no palpable dominant masses in either breast.There is a small puncture wound with surrounding ecchymosis in the superior aspect of the left breast.  Abdominal: Soft. She exhibits no distension and no mass. There is no tenderness.  Musculoskeletal: She exhibits no edema.  No supraclavicular or axillary adenopathy.  Lymphadenopathy:    She has no cervical adenopathy.  Neurological: She is alert.  Skin: Skin is warm and dry.  Psychiatric: She has a normal mood and affect. Her behavior is normal.    Data Reviewed Mammogram and ultrasound. Pathology  Assessment    Invasive ductal carcinoma of left breast. She is a good candidate for breast conservation and is interested in this. She would like to see the radiation oncologist prior to making a final decision as she is concerned what affects radiation may have on her heart.  She also has paroxysmal atrial fibrillation and will need to  communicate with her cardiologist to see if any further preoperative workup is needed.    Plan    Referral to radiation oncology and medical oncology.Will send a note cardiologist to see if she needs any preoperative cardiac assessment.  At this time, breast conservation is planned. Plan left partial mastectomy after radioactive seed localization and left axillary sentinel  lymph node biopsy.  I have explained the procedure, risks, and aftercare.  The risks include but are not limited to bleeding, infection, wound problems, seroma formation, anesthesia, nerve injury, lymphedema, need for reexcision or removal of more lymph nodes at a later time.  She seems to understand and agrees with the plan.       Newell Wafer J 09/26/2013, 10:46 AM

## 2013-10-18 NOTE — Transfer of Care (Signed)
Immediate Anesthesia Transfer of Care Note  Patient: Colleen Galloway  Procedure(s) Performed: Procedure(s): LEFT AXILLARY LYMPHATIC MAPPING; INJECTION OF METHYLENE BLUE INTO LEFT BREAST; LEFT BREAST PARTIAL MASTECTOMY AFTER NEEDLE LOCALIZATION; AXILLARY SENTINEL LYMPH NODE BIOPSY (Left) INSERTION PORT-A-CATH/ULTRASOUND GUIDED,RIGHT INTERNAL JUGULAR (N/A)  Patient Location: PACU  Anesthesia Type:General  Level of Consciousness: awake, alert  and oriented  Airway & Oxygen Therapy: Patient Spontanous Breathing and Patient connected to nasal cannula oxygen  Post-op Assessment: Report given to PACU RN, Post -op Vital signs reviewed and stable and Patient moving all extremities X 4  Post vital signs: Reviewed and stable  Complications: No apparent anesthesia complications

## 2013-10-18 NOTE — Discharge Instructions (Addendum)
[place] Office Phone Number [phone]  BREAST BIOPSY/ PARTIAL MASTECTOMY: POST OP INSTRUCTIONS  Always review your discharge instruction sheet given to you by the facility where your surgery was performed.  IF YOU HAVE DISABILITY OR FAMILY LEAVE FORMS, YOU MUST BRING THEM TO THE OFFICE FOR PROCESSING.  DO NOT GIVE THEM TO YOUR DOCTOR.  1. A prescription for pain medication may be given to you upon discharge.  Take your pain medication as prescribed, if needed.  If narcotic pain medicine is not needed, then you may take acetaminophen (Tylenol) or ibuprofen (Advil) as needed. 2. Take your usually prescribed medications unless otherwise directed 3. If you need a refill on your pain medication, please contact your pharmacy.  They will contact our office to request authorization.  Prescriptions will not be filled after 5pm or on week-ends. 4. You should eat very light the first 24 hours after surgery, such as soup, crackers, pudding, etc.  Resume your normal diet the day after surgery. 5. Most patients will experience some swelling and bruising in the breast.  Ice packs and a good support bra will help.  Swelling and bruising can take several days to resolve.  6. It is common to experience some constipation if taking pain medication after surgery.  Increasing fluid intake and taking a stool softener will usually help or prevent this problem from occurring.  A mild laxative (Milk of Magnesia or Miralax) should be taken according to package directions if there are no bowel movements after 48 hours. 7. Unless discharge instructions indicate otherwise, you may remove the breast binder and left breast bandages 48 hours after surgery, and you may shower at that time. Remove bandages on right chest and neck 72 hours after your surgery. You may have steri-strips (small skin tapes) in place directly over the incision.  These strips should be left on the skin.  If your surgeon used skin glue  on the incision, you may shower in 24 hours.  The glue will flake off over the next 2-3 weeks.  Any sutures or staples will be removed at the office during your follow-up visit. 8. ACTIVITIES:  Light activities for 2 weeks.   Wearing a good support bra or sports bra minimizes pain and swelling.  You may have sexual intercourse when it is comfortable. a. You may drive when you no longer are taking prescription pain medication, you can comfortably wear a seatbelt, and you can safely maneuver your car and apply brakes. b. RETURN TO WORK:  ______________________________________________________________________________________ Dennis Bast should see your doctor in the office for a follow-up appointment approximately two weeks after your surgery.  Please call to make this appointment.  OTHER INSTRUCTIONS: ________Restart Xarelto on 10/21/13._______________________________________________________________________________________ _____________________________________________________________________________________________________________________________________ _____________________________________________________________________________________________________________________________________ _____________________________________________________________________________________________________________________________________  WHEN TO CALL YOUR DOCTOR: 1. Fever over 101.0 2. Nausea and/or vomiting. 3. Extreme swelling or bruising. 4. Continued bleeding from incision. 5. Increased pain, redness, or drainage from the incision.  The clinic staff is available to answer your questions during regular business hours.  Please dont hesitate to call and ask to speak to one of the nurses for clinical concerns.  If you have a medical emergency, go to the nearest emergency room or call 911.  A surgeon from [facility] is always on call at the hospital.  For further questions, please visit [email or web address]    What to eat:  For your first meals, you should eat lightly; only small meals initially.  If you do not have nausea, you may eat larger meals.  Avoid spicy, greasy and heavy food.    General Anesthesia, Adult, Care After  Refer to this sheet in the next few weeks. These instructions provide you with information on caring for yourself after your procedure. Your health care provider may also give you more specific instructions. Your treatment has been planned according to current medical practices, but problems sometimes occur. Call your health care provider if you have any problems or questions after your procedure.  WHAT TO EXPECT AFTER THE PROCEDURE  After the procedure, it is typical to experience:  Sleepiness.  Nausea and vomiting. HOME CARE INSTRUCTIONS  For the first 24 hours after general anesthesia:  Have a responsible person with you.  Do not drive a car. If you are alone, do not take public transportation.  Do not drink alcohol.  Do not take medicine that has not been prescribed by your health care provider.  Do not sign important papers or make important decisions.  You may resume a normal diet and activities as directed by your health care provider.  Change bandages (dressings) as directed.  If you have questions or problems that seem related to general anesthesia, call the hospital and ask for the anesthetist or anesthesiologist on call. SEEK MEDICAL CARE IF:  You have nausea and vomiting that continue the day after anesthesia.  You develop a rash. SEEK IMMEDIATE MEDICAL CARE IF:  You have difficulty breathing.  You have chest pain.  You have any allergic problems. Document Released: 05/09/2000 Document Revised: 10/03/2012 Document Reviewed: 08/16/2012  [facility] Patient Information 2014 [place], [place].

## 2013-10-18 NOTE — Op Note (Signed)
Operative Note  Colleen Galloway female 79 y.o. 10/18/2013  PREOPERATIVE DX:  Invasive left breast cancer  POSTOPERATIVE DX:  Same  PROCEDURE:  1. Left axillary lymphatic mapping. 2. Injection of methylene blue dye into the left breast.  3. Left partial mastectomy after wire localization. 4. Left axillary sentinel lymph node biopsy. 5. Ultrasound-guided Port-A-Cath insertion into right internal jugular vein with fluoroscopy.         Surgeon: [name]   Assistants: none  Anesthesia: General LMA anesthesia  Indications: this is a 79 year old female who had an abnormality on her left breast mammogram. Image guided needle biopsy was consistent with invasive breast cancer. It was HER-2/neu-positive. She now presents for the above procedures.    Procedure Detail:  She underwent successful wire localization at [place].  She was seen in the holding area in her left breast to mark my initials. Radioactive injection was done around the left nipple areolar complex by the nuclear medicine technician in the holding room. She was then brought to the operating room placed supine on the operating table and a general anesthetic was given. The bandage on the left breast was removed and the wire was cut closer to the skin.  Using the neoprobe, left axillary lymphatic mapping was performed and there was no significant area of increased counts. Subsequently, 1.5 cc of methylene blue dye was injected in 4 quadrant areas of the left nipple areolar complex. The breast was massaged for 5 minutes.The neck chest and breasts were sterilely prepped and draped.  At the superior aspect the left breast, a curvilinear incision was made through skin and subcutaneous tissue. The wire was brought into the wound. Using electrocautery a partial mastectomy was performed around the wire to try to include normal margins grossly. The specimen was marked anteriorly with a single suture and medially with a double suture. Once the  specimen was removed, specimen mammogram was performed in the area of concern as well as the clip were in the middle of the specimen. This was verified by the radiologist.  Was inspected and bleeding was controlled with electrocautery. Half percent plain Marcaine was injected into the wound for local anesthetic affect. The subcutaneous tissue of the wound is then closed with interrupted 3-0 Vicryl sutures. The skin was closed with a running 4-0 Monocryl subcuticular stitch.  The left axilla was approached. The neoprobe was placed in the area of the left axilla and an area of increased counts was noted. A lower transverse incision was made in the inferior aspect of left axilla and the subcutaneous tissue was divided electrocautery. Using the neoprobe identified an area of increased counts. I subsequently was able to palpate a lymph node. This was removed with electrocautery. The node was hot but not blue. It was sent as sentinel lymph node. No other areas of increased counts were noted. No blue nodes were noted. Bleeding was controlled electrocautery. The wound was injected with half percent plain Marcaine for local anesthetic affect. Once hemostasis was adequate, the subcutaneous tissue the wound was closed with interrupted 3-0 Vicryl sutures. The dkin was closed with 4-0 Monocryl subcuticular stitch. Steri-Strips were placed both on the left breast wound and the left exit wound.  Next I approached the right side of her to place the Port-A-Cath. Her head was rotated to the right. Using the ultrasound, the right internal jugular vein was identified.  A 16-gauge needle was used to cannulate the right internal jugular vein under ultrasound guidance. A wire was then  threaded through the needle into the internal jugular vein and down into the right heart under ultrasound and fluoroscopic guidance.   A right upper chest wall incision was made and a pocket was created for the Portacath.  An incision was made around  the wire in the neck. The catheter was then tunneled from the chest wall incision up through the neck incision.  A dilator- introducer complex was placed over the wire into the superior vena cava. The dilator and wire were then removed and the catheter was threaded through the peel-away sheath introducer into the right heart. The introducer was then peeled away and removed. Under fluoroscopic guidance, the tip of the catheter was then pulled back until it was at the junction of the superior vena cava and right atrium. The catheter was then connected to the port.  The port aspirated blood and flushed easily.  The port was then anchored to the chest wall with 2-0 Vicryl suture. Concentrated heparin solution was then placed into the port. The port and catheter position were then verified using fluoroscopy. The subcutaneous tissue was then closed over the port with running 3-0 Vicryl suture. The skin incisions were then closed with 4-0 Monocryl subcuticular stitches. Steri-Strips and sterile dressings were applied.  Sterile dressings were applied to the left breast and left exit wounds. A breast binder was applied.  She tolerated the procedures well without any apparent complications and was taken to the recovery room in satisfactory condition where a portable chest x-ray is pending   Estimated Blood Loss:  200 mL         Drains: none  Blood Given: none          Specimens: left breast tissue. Left axillary sentinel lymph node.        Complications:  * No complications entered in OR log *         Disposition: PACU - hemodynamically stable.         Condition: stable

## 2013-10-18 NOTE — Interval H&P Note (Signed)
History and Physical Interval Note:  10/18/2013 10:07 AM  Colleen Galloway  has presented today for surgery, with the diagnosis of Left breast cancer  The various methods of treatment have been discussed with the patient and family. After consideration of risks, benefits and other options for treatment, the patient has consented to  Procedure(s): BREAST LUMPECTOMY WITH NEEDLE LOCALIZATION AND AXILLARY SENTINEL LYMPH NODE BX (N/A) INSERTION PORT-A-CATH/ULTRASOUND GUIDED (N/A) as a surgical intervention .  The patient's history has been reviewed, patient examined, no change in status, stable for surgery.  I have reviewed the patient's chart and labs.  Questions were answered to the patient's satisfaction.     Lauris Serviss Lenna Sciara

## 2013-10-21 ENCOUNTER — Telehealth: Payer: Self-pay | Admitting: Hematology and Oncology

## 2013-10-21 NOTE — Telephone Encounter (Signed)
Labs/ov per 09/04 POF, mailed pt sch...Marland KitchenMarland KitchenKJ

## 2013-10-22 ENCOUNTER — Encounter (HOSPITAL_COMMUNITY): Payer: Self-pay | Admitting: General Surgery

## 2013-10-23 ENCOUNTER — Encounter: Payer: Self-pay | Admitting: Internal Medicine

## 2013-10-23 ENCOUNTER — Ambulatory Visit (INDEPENDENT_AMBULATORY_CARE_PROVIDER_SITE_OTHER): Payer: Commercial Managed Care - HMO | Admitting: Internal Medicine

## 2013-10-23 VITALS — BP 130/68 | HR 66 | Ht 64.0 in | Wt 165.4 lb

## 2013-10-23 DIAGNOSIS — I480 Paroxysmal atrial fibrillation: Secondary | ICD-10-CM

## 2013-10-23 DIAGNOSIS — I4891 Unspecified atrial fibrillation: Secondary | ICD-10-CM

## 2013-10-23 NOTE — Patient Instructions (Addendum)
Your physician recommends that you continue on your current medications as directed. Please refer to the Current Medication list given to you today.  Your physician wants you to follow-up in: 6 months with Dr. Klein. You will receive a reminder letter in the mail two months in advance. If you don't receive a letter, please call our office to schedule the follow-up appointment.  

## 2013-10-23 NOTE — Progress Notes (Signed)
Patient Care Team: Nicoletta Dress, MD as PCP - General (Internal Medicine) Deboraha Sprang, MD as Attending Physician (Cardiology) Rulon Eisenmenger, MD as Consulting Physician (Hematology and Oncology)   HPI  Colleen Galloway is a 79 y.o. female Seen in followup for atrial fibrillation for which she is anticoagulated. She had problems with edema on calcium channel blockers and has been managed with beta blockers.  Dizziness thought to be orthostasis and was managed with  ProAmatine and isometrics  She is concerned about the cost of her Rivaroxaban  it comes out about $900 for 90 days.  The patient was recently hospitalized from cancer surgery. Daily her margins were clear and her lymph nodes were negative.     Past Medical History  Diagnosis Date  . Atrial fibrillation   . Lumbosacral spondylosis without myelopathy   . Carotid artery disease     Followed by Dr. Scot Dock  . Insomnia, unspecified   . Esophageal reflux   . Fibrocystic disease of breast   . Osteoarthrosis, unspecified whether generalized or localized, unspecified site   . Urinary tract infection   . Arthritis   . Unspecified sinusitis (chronic)   . Orthostatic lightheadedness 10/20/2011  . Calculus of kidney     kidney stones  . Breast cancer 09/17/13    Left iNVASIVE DUCTAL,dcis  . Allergy   . PONV (postoperative nausea and vomiting)   . Dysrhythmia   . CHF (congestive heart failure)     Past Surgical History  Procedure Laterality Date  . Dilation and curettage of uterus  1960  . Kidney stone surgery    . Varicose veins    . Breast lumpectomy with needle localization and axillary sentinel lymph node bx Left 10/18/2013    Procedure: LEFT AXILLARY LYMPHATIC MAPPING; INJECTION OF METHYLENE BLUE INTO LEFT BREAST; LEFT BREAST PARTIAL MASTECTOMY AFTER NEEDLE LOCALIZATION; AXILLARY SENTINEL LYMPH NODE BIOPSY;  Surgeon: Jackolyn Confer, MD;  Location: [facility];  Service: General;  Laterality: Left;  . Portacath  placement N/A 10/18/2013    Procedure: INSERTION PORT-A-CATH/ULTRASOUND GUIDED,RIGHT INTERNAL JUGULAR;  Surgeon: Jackolyn Confer, MD;  Location: [facility];  Service: General;  Laterality: N/A;    Current Outpatient Prescriptions  Medication Sig Dispense Refill  . calcium citrate-vitamin D (CITRACAL+D) 315-200 MG-UNIT per tablet Take 1 tablet by mouth daily.       . chlorthalidone (HYGROTON) 25 MG tablet Take 25 mg by mouth daily as needed (for fluid).       Marland Kitchen doxycycline (ADOXA) 50 MG tablet Take 50 mg by mouth daily as needed (for rosacea).       Marland Kitchen HYDROcodone-acetaminophen (NORCO/VICODIN) 5-325 MG per tablet Take 1-2 tablets by mouth every 4 (four) hours as needed for moderate pain or severe pain.  40 tablet  0  . meclizine (ANTIVERT) 25 MG tablet Take 1 tablet (25 mg total) by mouth 3 (three) times daily as needed for dizziness.  90 tablet  2  . meloxicam (MOBIC) 15 MG tablet Take 15 mg by mouth daily as needed for pain.       . metoprolol succinate (TOPROL-XL) 25 MG 24 hr tablet Take 1 tablet (25 mg total) by mouth daily.  90 tablet  1  . Multiple Vitamin (MULTIVITAMIN) capsule Take 1 capsule by mouth daily.        . Naphazoline-Pheniramine (OPCON-A) 0.027-0.315 % SOLN Place 1 drop into the left eye 2 (two) times daily as needed (for allergies).      Marland Kitchen  Omega-3 Fatty Acids (FISH OIL) 1200 MG CAPS Take 1 capsule by mouth every morning.       . Omega-3 Fatty Acids (SALMON OIL-1000) 200 MG CAPS Take 1 capsule by mouth every evening.       Marland Kitchen omeprazole (PRILOSEC) 20 MG capsule Take 20 mg by mouth daily.        Marland Kitchen Propylene Glycol (SYSTANE BALANCE) 0.6 % SOLN Apply 1 drop to eye 2 (two) times daily.      . traZODone (DESYREL) 50 MG tablet Take 50-100 mg by mouth at bedtime.       . Vaginal Lubricant (REPLENS) GEL Place 1 Applicatorful vaginally daily as needed (for dryness).      . vitamin C (ASCORBIC ACID) 500 MG tablet Take 500 mg by mouth daily.         No current facility-administered medications  for this visit.    Allergies  Allergen Reactions  . Diltiazem Hcl     Pt states it causes her ankle swelling   . Flecainide Other (See Comments)    Sob, visual problems, swelling ankles  . Lopressor [Metoprolol Tartrate]     Severe dizziness. Irregular heartbeat   . Nebivolol Swelling    Sob, visual problems, swelling in ankles  . Sodium Pantothenate     Review of Systems negative except from HPI and PMH  Physical Exam BP 130/68  Pulse 66  Ht 5\' 4"  (1.626 m)  Wt 165 lb 6.4 oz (75.025 kg)  BMI 28.38 kg/m2 Well developed and well nourished in no acute distress HENT normal E scleral and icterus clear Neck Supple JVP flat; carotids brisk and full Clear to ausculation  Regular rate and rhythm, no murmurs gallops or rub Soft with active bowel sounds No clubbing cyanosis  Edema Alert and oriented, grossly normal motor and sensory function Skin Warm and Dry  ECG demonstrates sinus rhythm at 71 Intervals 16/07/38 Nonspecific ST-T changes  Assessment and  Plan  Atrial fibrillation-paroxysmal  Hypertension  Malaise  Breast Cancer  the patient continues with paroxysms of atrial fibrillation. sHe remains on beta-blockade and anticoagulation. Blood pressure is reasonably controlled   She is beginning a long road of treatment for her breast cancer.

## 2013-10-24 ENCOUNTER — Telehealth: Payer: Self-pay | Admitting: Hematology and Oncology

## 2013-10-24 ENCOUNTER — Other Ambulatory Visit: Payer: Self-pay

## 2013-10-24 NOTE — Progress Notes (Signed)
Charting error.

## 2013-10-24 NOTE — Telephone Encounter (Signed)
per Terri to ignore 9/10 pof it was incorrect. Req to have taken out of the box

## 2013-10-28 ENCOUNTER — Ambulatory Visit (HOSPITAL_BASED_OUTPATIENT_CLINIC_OR_DEPARTMENT_OTHER): Payer: Commercial Managed Care - HMO | Admitting: Hematology and Oncology

## 2013-10-28 ENCOUNTER — Telehealth: Payer: Self-pay | Admitting: Hematology and Oncology

## 2013-10-28 VITALS — BP 143/63 | HR 61 | Temp 98.6°F | Resp 18 | Ht 64.0 in | Wt 164.9 lb

## 2013-10-28 DIAGNOSIS — Z170 Estrogen receptor positive status [ER+]: Secondary | ICD-10-CM

## 2013-10-28 DIAGNOSIS — C50919 Malignant neoplasm of unspecified site of unspecified female breast: Secondary | ICD-10-CM

## 2013-10-28 NOTE — Assessment & Plan Note (Signed)
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 breast cancer based on that 2.4 cm tumor. I recommended systemic adjuvant chemotherapy with Abraxane Herceptin based on our previous discussions regarding her personal preferences to not undergo chemotherapy if she could lose hair. We will do weekly Abraxane Herceptin treatments x12. This would be followed by every 3 week Herceptin maintenance along with that radiation therapy. After radiation is complete she will go on antiestrogen therapy.  Counseled her extensively regarding this and benefits of Abraxane including the risk of infection, fatigue, neuropathy, mild hair thinning as well as Herceptin related cardiac side affects. She will be set up for chemotherapy class and he would like to obtain an echocardiogram as well.  Our plan is to start her on chemotherapy October 7. She has a port that was implanted to the time of surgery which is also healing well.

## 2013-10-28 NOTE — Progress Notes (Signed)
MD created note during ofc visit - copy to pt, original to scan.

## 2013-10-28 NOTE — Telephone Encounter (Signed)
per pof to sch pt sch-sent pre-cert to [place] email to sch trmt-will call pt once reply

## 2013-10-28 NOTE — Progress Notes (Signed)
Patient Care Team: Nicoletta Dress, MD as PCP - General (Internal Medicine) Deboraha Sprang, MD as Attending Physician (Cardiology) Rulon Eisenmenger, MD as Consulting Physician (Hematology and Oncology)  DIAGNOSIS: Breast cancer-left, invasive ductal, ER/PR positive   Primary site: Breast (Left)   Staging method: AJCC 7th Edition   Clinical: Stage IA (T1c, N0, cM0) signed by Rulon Eisenmenger, MD on 10/04/2013  1:59 PM   Summary: Stage IA (T1c, N0, cM0)   SUMMARY OF ONCOLOGIC HISTORY:   Breast cancer-left, invasive ductal, ER/PR positive   09/17/2013 Initial Diagnosis Left breast: invasive ductal cancer ER 100% PR 98% HER-2 amplified ratio 2.6 with gene copy #3.25 Ki-67 is 78%   09/27/2013 Breast MRI Left breast 1 cm mass with linear non-mass enhancement consistent with biopsy-proven DCIS dispense 1.8 cm. No pathologic lymphadenopathy   10/18/2013 Surgery left breast lumpectomy invasive ductal carcinoma grade 32.4 cm with high-grade DCIS 1 sentinel node negative, ER 100% PR 98% HER-2 amplified ratio 2.6 Ki-67 78% T2, N0, M0 stage II A.    CHIEF COMPLIANT: Followup of the left breast lumpectomy  INTERVAL HISTORY: Ms. Zollner is a 79 year old Caucasian lady with above-mentioned history of left breast invasive ductal carcinoma that was ER/PR and HER-2 positive. We discussed with her before surgery that she could undergo neoadjuvant chemotherapy or adjuvant chemotherapy. She finally underwent left breast lumpectomy on 10/18/2013 and it revealed a 2.4 cm tumor which was ER/PR and HER-2 positive. She is only 10 days postoperatively she complains of soreness in the left breast along with it, couple of areas of redness. When she needs followed the breasts are very tender because of the weight. she also has mild breast edema in addition to pain.   REVIEW OF SYSTEMS:   Constitutional: Denies fevers, chills or abnormal weight loss Eyes: Denies blurriness of vision Ears, nose, mouth, throat, and face: Denies  mucositis or sore throat Respiratory: Denies cough, dyspnea or wheezes Cardiovascular: Denies palpitation, chest discomfort or lower extremity swelling Gastrointestinal:  Denies nausea, heartburn or change in bowel habits Skin: Denies abnormal skin rashes Lymphatics: Denies new lymphadenopathy or easy bruising Neurological:Denies numbness, tingling or new weaknesses Behavioral/Psych: Mood is stable, no new changes  Breast: Tenderness, redness at the site of surgery  All other systems were reviewed with the patient and are negative.  I have reviewed the past medical history, past surgical history, social history and family history with the patient and they are unchanged from previous note.  ALLERGIES:  is allergic to diltiazem hcl; flecainide; lopressor; nebivolol; and sodium pantothenate.  MEDICATIONS:  Current Outpatient Prescriptions  Medication Sig Dispense Refill  . calcium citrate-vitamin D (CITRACAL+D) 315-200 MG-UNIT per tablet Take 1 tablet by mouth daily.       . chlorthalidone (HYGROTON) 25 MG tablet Take 25 mg by mouth daily as needed (for fluid).       Marland Kitchen doxycycline (ADOXA) 50 MG tablet Take 50 mg by mouth daily as needed (for rosacea).       . meclizine (ANTIVERT) 25 MG tablet Take 1 tablet (25 mg total) by mouth 3 (three) times daily as needed for dizziness.  90 tablet  2  . meloxicam (MOBIC) 15 MG tablet Take 15 mg by mouth daily as needed for pain.       . metoprolol succinate (TOPROL-XL) 25 MG 24 hr tablet Take 1 tablet (25 mg total) by mouth daily.  90 tablet  1  . Multiple Vitamin (MULTIVITAMIN) capsule Take 1 capsule by mouth daily.        Marland Kitchen  Naphazoline-Pheniramine (OPCON-A) 0.027-0.315 % SOLN Place 1 drop into the left eye 2 (two) times daily as needed (for allergies).      . Omega-3 Fatty Acids (FISH OIL) 1200 MG CAPS Take 1 capsule by mouth every morning.       . Omega-3 Fatty Acids (SALMON OIL-1000) 200 MG CAPS Take 1 capsule by mouth every evening.       Marland Kitchen  omeprazole (PRILOSEC) 20 MG capsule Take 20 mg by mouth daily.        Marland Kitchen Propylene Glycol (SYSTANE BALANCE) 0.6 % SOLN Apply 1 drop to eye 2 (two) times daily.      . traZODone (DESYREL) 50 MG tablet Take 50-100 mg by mouth at bedtime.       . Vaginal Lubricant (REPLENS) GEL Place 1 Applicatorful vaginally daily as needed (for dryness).      . vitamin C (ASCORBIC ACID) 500 MG tablet Take 500 mg by mouth daily.        Marland Kitchen HYDROcodone-acetaminophen (NORCO/VICODIN) 5-325 MG per tablet Take 1-2 tablets by mouth every 4 (four) hours as needed for moderate pain or severe pain.  40 tablet  0   No current facility-administered medications for this visit.    PHYSICAL EXAMINATION: ECOG PERFORMANCE STATUS: 1 - Symptomatic but completely ambulatory  Filed Vitals:   10/28/13 1306  BP: 143/63  Pulse: 61  Temp: 98.6 F (37 C)  Resp: 18   Filed Weights   10/28/13 1306  Weight: 164 lb 14.4 oz (74.798 kg)    GENERAL:alert, no distress and comfortable SKIN: skin color, texture, turgor are normal, no rashes or significant lesions EYES: normal, Conjunctiva are pink and non-injected, sclera clear OROPHARYNX:no exudate, no erythema and lips, buccal mucosa, and tongue normal  NECK: supple, thyroid normal size, non-tender, without nodularity LYMPH:  no palpable lymphadenopathy in the cervical, axillary or inguinal LUNGS: clear to auscultation and percussion with normal breathing effort HEART: regular rate & rhythm and no murmurs and no lower extremity edema ABDOMEN:abdomen soft, non-tender and normal bowel sounds Musculoskeletal:no cyanosis of digits and no clubbing  NEURO: alert & oriented x 3 with fluent speech, no focal motor/sensory deficits BREAST: Left breast showing mild lymphedema the axillary scar in the breast scar appeared to be healing well no evidence of any infection but very sore to touch   LABORATORY DATA:  I have reviewed the data as listed   Chemistry      Component Value Date/Time    NA 140 10/15/2013 1308   K 4.3 10/15/2013 1308   CL 102 10/15/2013 1308   CO2 26 10/15/2013 1308   BUN 12 10/15/2013 1308   CREATININE 0.81 10/15/2013 1308      Component Value Date/Time   CALCIUM 9.8 10/15/2013 1308   ALKPHOS 92 10/15/2013 1308   AST 37 10/15/2013 1308   ALT 32 10/15/2013 1308   BILITOT 0.4 10/15/2013 1308       Lab Results  Component Value Date   WBC 6.0 10/15/2013   HGB 13.9 10/15/2013   HCT 42.2 10/15/2013   MCV 91.1 10/15/2013   PLT 160 10/15/2013   NEUTROABS 4.0 10/15/2013    ASSESSMENT & PLAN:  Breast cancer-left, invasive ductal, ER/PR positive 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 breast cancer based on that 2.4 cm tumor. I recommended systemic adjuvant chemotherapy with Abraxane Herceptin based on our previous discussions regarding her personal  preferences to not undergo chemotherapy if she could lose hair. We will do weekly Abraxane Herceptin treatments x12. This would be followed by every 3 week Herceptin maintenance along with that radiation therapy. After radiation is complete she will go on antiestrogen therapy.  Counseled her extensively regarding this and benefits of Abraxane including the risk of infection, fatigue, neuropathy, mild hair thinning as well as Herceptin related cardiac side affects. She will be set up for chemotherapy class and he would like to obtain an echocardiogram as well.  Our plan is to start her on chemotherapy October 7. She has a port that was implanted to the time of surgery which is also healing well.    Orders Placed This Encounter  Procedures  . 2D Echocardiogram without contrast    Standing Status: Future     Number of Occurrences:      Standing Expiration Date: 10/28/2014    Scheduling Instructions:     PROVIDERS If this is NOT for EF then please REASON that  needs COMPLETE STUDY    Order Specific Question:  Type of Echo    Answer:   Complete    Order Specific Question:  Reason for Exam    Answer:  Baseline prior to cardiotoxic chemotherapy    Order Specific Question:  Where should this test be performed    Answer:  [facility]   The patient has a good understanding of the overall plan. she agrees with it. She will call with any problems that may develop before her next visit here.  I spent 25 minutes counseling the patient face to face. The total time spent in the appointment was 30 minutes and more than 50% was on counseling and review of test results    Rulon Eisenmenger, MD 10/28/2013 2:21 PM

## 2013-10-29 ENCOUNTER — Telehealth: Payer: Self-pay | Admitting: *Deleted

## 2013-10-29 NOTE — Telephone Encounter (Signed)
Per staff message and POF I have scheduled appts. Advised scheduler of appts. JMW  

## 2013-11-05 ENCOUNTER — Telehealth: Payer: Self-pay | Admitting: Hematology and Oncology

## 2013-11-05 ENCOUNTER — Other Ambulatory Visit: Payer: Commercial Managed Care - HMO

## 2013-11-05 ENCOUNTER — Encounter: Payer: Self-pay | Admitting: *Deleted

## 2013-11-05 ENCOUNTER — Telehealth: Payer: Self-pay | Admitting: *Deleted

## 2013-11-05 NOTE — Telephone Encounter (Signed)
per pof to sch pt ECHO per Vaughan Basta 3/55 no pre-cert req

## 2013-11-05 NOTE — Telephone Encounter (Signed)
Faxed patient information to [facility] [place] co-pay relief program, sent to scan.

## 2013-11-06 ENCOUNTER — Encounter: Payer: Self-pay | Admitting: Hematology and Oncology

## 2013-11-06 NOTE — Progress Notes (Signed)
Received letter from [employer].  Pt is approved for assistance from 10/29/13 to 10/30/14 for copays, coins, deductibles and for medications prescribed to treat breast cancer.  She has been approved for $5000, $1650 is guaranteed and $3350 is accessible on a [employer] basis.  I will forward a copy of letter to [facility] in the billing dept.

## 2013-11-07 ENCOUNTER — Ambulatory Visit (HOSPITAL_COMMUNITY)
Admission: RE | Admit: 2013-11-07 | Discharge: 2013-11-07 | Disposition: A | Discharge status: Home / Self Care | Payer: Medicare HMO | Source: Ambulatory Visit | Attending: Hematology and Oncology | Admitting: Hematology and Oncology

## 2013-11-07 DIAGNOSIS — C50919 Malignant neoplasm of unspecified site of unspecified female breast: Secondary | ICD-10-CM | POA: Diagnosis present

## 2013-11-07 DIAGNOSIS — Z01818 Encounter for other preprocedural examination: Secondary | ICD-10-CM | POA: Insufficient documentation

## 2013-11-07 DIAGNOSIS — I369 Nonrheumatic tricuspid valve disorder, unspecified: Secondary | ICD-10-CM

## 2013-11-07 NOTE — Progress Notes (Signed)
  Echocardiogram 2D Echocardiogram has been performed.  Colleen Galloway M 11/07/2013, 11:47 AM

## 2013-11-08 NOTE — Progress Notes (Addendum)
Location of Breast Cancer: Upper Left Breast Grade III, Follow up New Consult   Histology per Pathology Report: 09/17/2013: Left Breast Biopsy: Invasive Ductal Carcinoma, DCIS,grade III   Receptor Status: ER( + ), PR ( + ), Her2-neu (+)   Did patient present with symptoms (if so, please note symptoms) or was this found on screening mammography?: screening mammogram  Past/Anticipated interventions by surgeon, if any:Dr. Rhunette Croft., [id number] 09/26/13, breast conservation planned,with left partial mastectomy after radioactive seed localization and left axillary node bx  Diagnosis 10/18/13: 1. Breast, lumpectomy, left- INVASIVE DUCTAL CARCINOMA, GRADE III/III, SPANNING 2.4 CM.- DUCTAL CARCINOMA IN SITU, HIGH GRADE- DUCTAL CARCINOMA IN SITU IS FOCALLY 0.1 CM TO THE INFERIOR MARGIN. - SEE ONCOLOGY TABLE BELOW.2. Lymph node, sentinel, biopsy, left axillary #1- THERE IS NO EVIDENCE OF CARCINOMA IN 1 OF 1 LYMPH NODE (0 Dt. T Rosenbower, f/u appt 11/07/13  Post op  Past/Anticipated interventions by medical oncology, if any: Chemotherapy appt with Dr. Rulon Eisenmenger, MD 10/04/13 10/28/13: Counseled her extensively regarding this and benefits of Abraxane including the risk of infection, fatigue, neuropathy, mild hair thinning as well as Herceptin related cardiac side affects. She will be set up for chemotherapy class and he would like to obtain an echocardiogram as well. Echo cardiogram done 11/07/13 Our plan is to start her on chemotherapy October 7. She has a port that was implanted to the time of surgery which is also healing well. Dr. Lindi Adie, Vinay,K.,MD  Lymphedema issues, if any: No  Pain issues, if any: tenderness  Left breast, bruising, swelling and redness still on breast, feels heavy , got flu vaccine Friday 11/08/13 [facility], drainage Friday night on incision of left breast a couple times, none sine Saturday  SAFETY ISSUES:  Prior radiation? No  Pacemaker/ICD? nO  Possible current pregnancy? N/A   Is the patient on methotrexate? No Current Complaints / other details: No children, menses age 79,took HRT prolonged period of time ,menopause between ages [age], D&C1960,A-Fib,, non smoker,no smokless tobacco, non alcohol or illicit drug use  Mother cancer,heart disease,HTN,,father heart disease,stroke,HTN,  Roslynn Amble, Felicita Gage, RN         Rebecca Eaton, RN 11/08/2013,4:45 PM

## 2013-11-11 ENCOUNTER — Ambulatory Visit
Admission: RE | Admit: 2013-11-11 | Discharge: 2013-11-11 | Disposition: A | Discharge status: Home / Self Care | Payer: Medicare HMO | Source: Ambulatory Visit | Attending: Radiation Oncology | Admitting: Radiation Oncology

## 2013-11-11 ENCOUNTER — Encounter: Payer: Self-pay | Admitting: Radiation Oncology

## 2013-11-11 VITALS — BP 113/62 | HR 70 | Temp 97.6°F | Resp 16 | Ht 64.0 in | Wt 166.7 lb

## 2013-11-11 DIAGNOSIS — Z510 Encounter for antineoplastic radiation therapy: Secondary | ICD-10-CM | POA: Diagnosis not present

## 2013-11-11 DIAGNOSIS — D0590 Unspecified type of carcinoma in situ of unspecified breast: Secondary | ICD-10-CM | POA: Insufficient documentation

## 2013-11-11 DIAGNOSIS — Z170 Estrogen receptor positive status [ER+]: Secondary | ICD-10-CM | POA: Diagnosis not present

## 2013-11-11 DIAGNOSIS — Z79899 Other long term (current) drug therapy: Secondary | ICD-10-CM | POA: Diagnosis not present

## 2013-11-11 DIAGNOSIS — C50212 Malignant neoplasm of upper-inner quadrant of left female breast: Secondary | ICD-10-CM

## 2013-11-11 NOTE — Progress Notes (Signed)
Please see the Nurse Progress Note in the MD Initial Consult Encounter for this patient. 

## 2013-11-11 NOTE — Progress Notes (Signed)
Radiation Oncology         [phone] ________________________________  Name: Colleen Galloway MRN: [id number]  Date: 11/11/2013  DOB: 23-Nov-1934  Follow-Up Visit Note  CC: Nicoletta Dress, MD  Jackolyn Confer, MD  Diagnosis:      Breast cancer-left, invasive ductal, ER/PR positive   09/17/2013 Initial Diagnosis Left breast: invasive ductal cancer ER 100% PR 98% HER-2 amplified ratio 2.6 with gene copy #3.25 Ki-67 is 78%   09/27/2013 Breast MRI Left breast 1 cm mass with linear non-mass enhancement consistent with biopsy-proven DCIS dispense 1.8 cm. No pathologic lymphadenopathy   10/18/2013 Surgery left breast lumpectomy invasive ductal carcinoma grade 2.4 cm with high-grade DCIS 1 sentinel node negative, ER 100% PR 98% HER-2 amplified ratio 2.6 Ki-67 78% T2, N0, M0 stage II A.     Narrative:  The patient returns today for routine follow-up.  The patient states that she is feeling fairly well after surgery. She still is recovering. No difficulties with infection. She had a little bit of drainage from the incision site for a day or 2 but this has resolved.  The patient's results demonstrated a T2, N0, M0 tumor measuring 2.4 cm. This is a triple positive tumor with a Ki-67 staining of 78%. The margins were negative. The ductal carcinoma in situ focally extended to 0.1 cm from the inferior margin.                              ALLERGIES:  is allergic to diltiazem hcl; flecainide; lopressor; nebivolol; and sodium pantothenate.  Meds: Current Outpatient Prescriptions  Medication Sig Dispense Refill  . acetaminophen (TYLENOL) 500 MG tablet Take 500 mg by mouth every 6 (six) hours as needed.      . calcium citrate-vitamin D (CITRACAL+D) 315-200 MG-UNIT per tablet Take 1 tablet by mouth daily.       . chlorthalidone (HYGROTON) 25 MG tablet Take 25 mg by mouth daily as needed (for fluid).       Marland Kitchen doxycycline (ADOXA) 50 MG tablet Take 50 mg by mouth daily as needed (for rosacea).       .  meclizine (ANTIVERT) 25 MG tablet Take 1 tablet (25 mg total) by mouth 3 (three) times daily as needed for dizziness.  90 tablet  2  . meloxicam (MOBIC) 15 MG tablet Take 15 mg by mouth daily as needed for pain.       . metoprolol succinate (TOPROL-XL) 25 MG 24 hr tablet Take 1 tablet (25 mg total) by mouth daily.  90 tablet  1  . Multiple Vitamin (MULTIVITAMIN) capsule Take 1 capsule by mouth daily.        . Naphazoline-Pheniramine (OPCON-A) 0.027-0.315 % SOLN Place 1 drop into the left eye 2 (two) times daily as needed (for allergies).      . Omega-3 Fatty Acids (FISH OIL) 1200 MG CAPS Take 1 capsule by mouth every morning.       . Omega-3 Fatty Acids (SALMON OIL-1000) 200 MG CAPS Take 1 capsule by mouth every evening.       Marland Kitchen omeprazole (PRILOSEC) 20 MG capsule Take 20 mg by mouth daily.        Marland Kitchen Propylene Glycol (SYSTANE BALANCE) 0.6 % SOLN Apply 1 drop to eye 2 (two) times daily.      . traZODone (DESYREL) 50 MG tablet Take 50-100 mg by mouth at bedtime.       . Vaginal Lubricant (REPLENS)  GEL Place 1 Applicatorful vaginally daily as needed (for dryness).      . vitamin C (ASCORBIC ACID) 500 MG tablet Take 500 mg by mouth daily.        Marland Kitchen HYDROcodone-acetaminophen (NORCO/VICODIN) 5-325 MG per tablet Take 1-2 tablets by mouth every 4 (four) hours as needed for moderate pain or severe pain.  40 tablet  0   No current facility-administered medications for this encounter.    Physical Findings: The patient is in no acute distress. Patient is alert and oriented.  height is 5\' 4"  (1.626 m) and weight is 166 lb 11.2 oz (75.615 kg). Her oral temperature is 97.6 F (36.4 C). Her blood pressure is 113/62 and her pulse is 70. Her respiration is 16. .   The surgical site is healing well. No drainage. No sign of infection.  Lab Findings: Lab Results  Component Value Date   WBC 6.0 10/15/2013   HGB 13.9 10/15/2013   HCT 42.2 10/15/2013   MCV 91.1 10/15/2013   PLT 160 10/15/2013     Radiographic  Findings: Chest 2 View  10/15/2013   CLINICAL DATA:  Preoperative chest x-ray  EXAM: CHEST  2 VIEW  COMPARISON:  Prior chest x-ray 08/27/2012  FINDINGS: The lungs are clear and negative for focal airspace consolidation, pulmonary edema or suspicious pulmonary nodule. No pleural effusion or pneumothorax. Cardiac and mediastinal contours are within normal limits. Trace atherosclerotic calcifications within the thoracic aorta. No acute fracture or lytic or blastic osseous lesions. Mild multilevel degenerative spurring in the mid thoracic spine. The visualized upper abdominal bowel gas pattern is unremarkable. Calcifications of left upper quadrant seen on prior imaging consistent with nephrolithiasis.  IMPRESSION: No active cardiopulmonary disease.  Left nephrolithiasis.   Electronically Signed   By: 08/29/2012 M.D.   On: 10/15/2013 14:15   Nm Sentinel Node Inj-no Rpt (breast)  10/18/2013   CLINICAL DATA: left breast cancer   Sulfur colloid was injected intradermally by the nuclear medicine  technologist for breast cancer sentinel node localization.    Chest Port 1 View  10/18/2013   CLINICAL DATA:  Port-A-Cath placement.  EXAM: PORTABLE CHEST - 1 VIEW  COMPARISON:  10/15/2013  FINDINGS: The superior aspect of the tunneled portion of the Port-A-Cath is not visualized in the base of the neck. The catheter tip lies in the upper SVC. No pneumothorax. No pulmonary consolidation, edema or pleural fluid.  IMPRESSION: Port-A-Cath tip an upper SVC.  No pneumothorax.   Electronically Signed   By: 12/15/2013 M.D.   On: 10/18/2013 13:49   Dg Fluoro Guide Cv Line-no Report  10/18/2013   CLINICAL DATA: insertion of por a cath   FLOURO GUIDE CV LINE  Fluoroscopy was utilized by the requesting physician.  No radiographic  interpretation.     Impression:    The patient is status post lumpectomy for a T2, N0, M0 triple positive breast cancer of the left breast. She is scheduled to begin chemotherapy including  Herceptin within the next one to 2 weeks. I discussed with the patient that my recommendation would be for her to proceed with adjuvant radiation treatment after the initial phase of chemotherapy. This would be given with Herceptin which would continue. I believe that a hypo-fractionated, four-week course of treatment would be sufficient for her. The patient had a number of good questions which were answered today. We did discuss in detail the possible benefit from such a treatment as well as the possible side effects and  risks.  The patient after this conversation indicated that she did to proceed with adjuvant radiation treatment. The patient lives in [place], [place], and is thinking about getting her radiation treatment day or. I believe that this would be fine if this is her decision. If she proceeds with treatment hearing [place], then we would evaluate the patient for a breath-hold technique given the left-sided nature of her breast cancer.  Plan:  The patient has been tentatively scheduled to followup with me in 3 months. She is going to think about where she wants treatment and she may cancel this and asked [place] to schedule her an appointment closer to home. She will let us know her final decision.  I spent 30 minutes with the patient today, the majority of which was spent counseling the patient on the diagnosis of cancer and coordinating care.   Jodelle Gross, M.D., Ph.D.

## 2013-11-14 ENCOUNTER — Other Ambulatory Visit: Payer: Self-pay | Admitting: Hematology and Oncology

## 2013-11-14 DIAGNOSIS — C50919 Malignant neoplasm of unspecified site of unspecified female breast: Secondary | ICD-10-CM

## 2013-11-14 MED ORDER — PROCHLORPERAZINE MALEATE 10 MG PO TABS: 10.0000 mg | ORAL_TABLET | Freq: Four times a day (QID) | ORAL | Status: DC | PRN

## 2013-11-14 MED ORDER — DEXAMETHASONE 4 MG PO TABS: 8.0000 mg | ORAL_TABLET | Freq: Two times a day (BID) | ORAL | Status: DC

## 2013-11-14 MED ORDER — ONDANSETRON HCL 8 MG PO TABS: 8.0000 mg | ORAL_TABLET | Freq: Two times a day (BID) | ORAL | Status: DC

## 2013-11-14 MED ORDER — LORAZEPAM 0.5 MG PO TABS: 0.5000 mg | ORAL_TABLET | Freq: Four times a day (QID) | ORAL | Status: DC | PRN

## 2013-11-15 ENCOUNTER — Other Ambulatory Visit: Payer: Self-pay

## 2013-11-15 DIAGNOSIS — C50919 Malignant neoplasm of unspecified site of unspecified female breast: Secondary | ICD-10-CM

## 2013-11-15 MED ORDER — LIDOCAINE-PRILOCAINE 2.5-2.5 % EX CREA: 1.0000 "application " | TOPICAL_CREAM | CUTANEOUS | Status: DC | PRN

## 2013-11-15 NOTE — Progress Notes (Signed)
Called pt and let her know nausea medications & numbing cream have been prescribed.  Let pt know she needed to pick up Ativan prescription on her next visit.  Pt voiced understanding.

## 2013-11-19 ENCOUNTER — Other Ambulatory Visit: Payer: Self-pay | Admitting: *Deleted

## 2013-11-19 ENCOUNTER — Encounter: Payer: Self-pay | Admitting: *Deleted

## 2013-11-19 DIAGNOSIS — C50919 Malignant neoplasm of unspecified site of unspecified female breast: Secondary | ICD-10-CM

## 2013-11-19 MED ORDER — LIDOCAINE-PRILOCAINE 2.5-2.5 % EX CREA: 1.0000 "application " | TOPICAL_CREAM | CUTANEOUS | Status: DC | PRN

## 2013-11-19 NOTE — Progress Notes (Signed)
Notes from [facility] noted and sent to scan. Patient rx for Emla cream sent to [facility]. Patient called and informed of this.

## 2013-11-20 ENCOUNTER — Other Ambulatory Visit: Payer: Self-pay | Admitting: *Deleted

## 2013-11-20 DIAGNOSIS — C50919 Malignant neoplasm of unspecified site of unspecified female breast: Secondary | ICD-10-CM

## 2013-11-21 ENCOUNTER — Telehealth: Payer: Self-pay | Admitting: *Deleted

## 2013-11-21 ENCOUNTER — Other Ambulatory Visit (HOSPITAL_BASED_OUTPATIENT_CLINIC_OR_DEPARTMENT_OTHER): Payer: Commercial Managed Care - HMO

## 2013-11-21 ENCOUNTER — Ambulatory Visit (HOSPITAL_BASED_OUTPATIENT_CLINIC_OR_DEPARTMENT_OTHER): Payer: Commercial Managed Care - HMO

## 2013-11-21 ENCOUNTER — Telehealth: Payer: Self-pay | Admitting: Hematology and Oncology

## 2013-11-21 ENCOUNTER — Ambulatory Visit (HOSPITAL_BASED_OUTPATIENT_CLINIC_OR_DEPARTMENT_OTHER): Payer: Commercial Managed Care - HMO | Admitting: Hematology and Oncology

## 2013-11-21 VITALS — BP 140/69 | HR 70 | Temp 98.3°F | Resp 18 | Ht 64.0 in | Wt 169.8 lb

## 2013-11-21 DIAGNOSIS — C50919 Malignant neoplasm of unspecified site of unspecified female breast: Secondary | ICD-10-CM

## 2013-11-21 DIAGNOSIS — C50212 Malignant neoplasm of upper-inner quadrant of left female breast: Secondary | ICD-10-CM

## 2013-11-21 DIAGNOSIS — Z5112 Encounter for antineoplastic immunotherapy: Secondary | ICD-10-CM

## 2013-11-21 DIAGNOSIS — Z5111 Encounter for antineoplastic chemotherapy: Secondary | ICD-10-CM

## 2013-11-21 DIAGNOSIS — Z170 Estrogen receptor positive status [ER+]: Secondary | ICD-10-CM

## 2013-11-21 DIAGNOSIS — C50912 Malignant neoplasm of unspecified site of left female breast: Secondary | ICD-10-CM

## 2013-11-21 LAB — COMPREHENSIVE METABOLIC PANEL (CC13)
ALK PHOS: 121 U/L (ref 40–150)
ALT: 33 U/L (ref 0–55)
AST: 31 U/L (ref 5–34)
Albumin: 3.6 g/dL (ref 3.5–5.0)
Anion Gap: 7 mEq/L (ref 3–11)
BUN: 12.2 mg/dL (ref 7.0–26.0)
CO2: 26 meq/L (ref 22–29)
Calcium: 9.8 mg/dL (ref 8.4–10.4)
Chloride: 108 mEq/L (ref 98–109)
Creatinine: 0.8 mg/dL (ref 0.6–1.1)
Glucose: 103 mg/dl (ref 70–140)
Potassium: 3.8 mEq/L (ref 3.5–5.1)
SODIUM: 141 meq/L (ref 136–145)
TOTAL PROTEIN: 7 g/dL (ref 6.4–8.3)
Total Bilirubin: 0.27 mg/dL (ref 0.20–1.20)

## 2013-11-21 LAB — CBC WITH DIFFERENTIAL/PLATELET
BASO%: 1.1 % (ref 0.0–2.0)
Basophils Absolute: 0.1 10*3/uL (ref 0.0–0.1)
EOS ABS: 0.1 10*3/uL (ref 0.0–0.5)
EOS%: 1.7 % (ref 0.0–7.0)
HCT: 41.1 % (ref 34.8–46.6)
HGB: 13.3 g/dL (ref 11.6–15.9)
LYMPH%: 26 % (ref 14.0–49.7)
MCH: 29.3 pg (ref 25.1–34.0)
MCHC: 32.3 g/dL (ref 31.5–36.0)
MCV: 90.6 fL (ref 79.5–101.0)
MONO#: 0.3 10*3/uL (ref 0.1–0.9)
MONO%: 6.2 % (ref 0.0–14.0)
NEUT%: 65 % (ref 38.4–76.8)
NEUTROS ABS: 3.4 10*3/uL (ref 1.5–6.5)
Platelets: 160 10*3/uL (ref 145–400)
RBC: 4.54 10*6/uL (ref 3.70–5.45)
RDW: 14.7 % — ABNORMAL HIGH (ref 11.2–14.5)
WBC: 5.3 10*3/uL (ref 3.9–10.3)
lymph#: 1.4 10*3/uL (ref 0.9–3.3)

## 2013-11-21 MED ORDER — ACETAMINOPHEN 325 MG PO TABS
650.0000 mg | ORAL_TABLET | Freq: Once | ORAL | Status: AC
  Administered 2013-11-21: 650 mg via ORAL

## 2013-11-21 MED ORDER — TRASTUZUMAB CHEMO INJECTION 440 MG
4.0000 mg/kg | Freq: Once | INTRAVENOUS | Status: AC
  Administered 2013-11-21: 294 mg via INTRAVENOUS
  Filled 2013-11-21: qty 14

## 2013-11-21 MED ORDER — DEXAMETHASONE SODIUM PHOSPHATE 10 MG/ML IJ SOLN
INTRAMUSCULAR | Status: AC
  Filled 2013-11-21: qty 1

## 2013-11-21 MED ORDER — DIPHENHYDRAMINE HCL 25 MG PO CAPS
ORAL_CAPSULE | ORAL | Status: AC
  Filled 2013-11-21: qty 2

## 2013-11-21 MED ORDER — ACETAMINOPHEN 325 MG PO TABS
ORAL_TABLET | ORAL | Status: AC
  Filled 2013-11-21: qty 2

## 2013-11-21 MED ORDER — ONDANSETRON 8 MG/NS 50 ML IVPB
INTRAVENOUS | Status: AC
  Filled 2013-11-21: qty 8

## 2013-11-21 MED ORDER — SODIUM CHLORIDE 0.9 % IV SOLN
Freq: Once | INTRAVENOUS | Status: AC
  Administered 2013-11-21: 11:00:00 via INTRAVENOUS

## 2013-11-21 MED ORDER — PACLITAXEL PROTEIN-BOUND CHEMO INJECTION 100 MG
100.0000 mg/m2 | Freq: Once | INTRAVENOUS | Status: AC
  Administered 2013-11-21: 175 mg via INTRAVENOUS
  Filled 2013-11-21: qty 35

## 2013-11-21 MED ORDER — ONDANSETRON 8 MG/50ML IVPB (CHCC)
8.0000 mg | Freq: Once | INTRAVENOUS | Status: AC
  Administered 2013-11-21: 8 mg via INTRAVENOUS

## 2013-11-21 MED ORDER — SODIUM CHLORIDE 0.9 % IJ SOLN
10.0000 mL | INTRAMUSCULAR | Status: DC | PRN
  Administered 2013-11-21: 10 mL
  Filled 2013-11-21: qty 10

## 2013-11-21 MED ORDER — HEPARIN SOD (PORK) LOCK FLUSH 100 UNIT/ML IV SOLN
500.0000 [IU] | Freq: Once | INTRAVENOUS | Status: AC | PRN
  Administered 2013-11-21: 500 [IU]
  Filled 2013-11-21: qty 5

## 2013-11-21 MED ORDER — DEXAMETHASONE SODIUM PHOSPHATE 10 MG/ML IJ SOLN
10.0000 mg | Freq: Once | INTRAMUSCULAR | Status: AC
  Administered 2013-11-21: 10 mg via INTRAVENOUS

## 2013-11-21 MED ORDER — DIPHENHYDRAMINE HCL 25 MG PO CAPS
50.0000 mg | ORAL_CAPSULE | Freq: Once | ORAL | Status: AC
  Administered 2013-11-21: 50 mg via ORAL

## 2013-11-21 NOTE — Telephone Encounter (Signed)
Per staff message and POF I have scheduled appts. Advised scheduler of appts. JMW  

## 2013-11-21 NOTE — Patient Instructions (Signed)
[facility] Discharge Instructions for Patients Receiving Chemotherapy  Today you received the following chemotherapy agents Abraxane and Herceptin  To help prevent nausea and vomiting after your treatment, we encourage you to take your nausea medicatio Zofran and Compazine as directed   If you develop nausea and vomiting that is not controlled by your nausea medication, call the clinic.   BELOW ARE SYMPTOMS THAT SHOULD BE REPORTED IMMEDIATELY:  *FEVER GREATER THAN 100.5 F  *CHILLS WITH OR WITHOUT FEVER  NAUSEA AND VOMITING THAT IS NOT CONTROLLED WITH YOUR NAUSEA MEDICATION  *UNUSUAL SHORTNESS OF BREATH  *UNUSUAL BRUISING OR BLEEDING  TENDERNESS IN MOUTH AND THROAT WITH OR WITHOUT PRESENCE OF ULCERS  *URINARY PROBLEMS  *BOWEL PROBLEMS  UNUSUAL RASH Items with * indicate a potential emergency and should be followed up as soon as possible.  Feel free to call the clinic you have any questions or concerns. The clinic phone number is [phone].

## 2013-11-21 NOTE — Assessment & Plan Note (Signed)
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 breast cancer based on that 2.4 cm tumor  Patient is starting cycle 1 of Abraxane Herceptin weekly x12 chemotherapy today. All of her questions have been answered. She has signed consent form. She will return back to see [place] in one week for toxicity check and blood count checks.

## 2013-11-21 NOTE — Telephone Encounter (Signed)
per pof to sch pt appt-gave pt copy of sch °

## 2013-11-21 NOTE — Progress Notes (Signed)
Patient Care Team: Nicoletta Dress, MD as PCP - General (Internal Medicine) Deboraha Sprang, MD as Attending Physician (Cardiology) Rulon Eisenmenger, MD as Consulting Physician (Hematology and Oncology)  DIAGNOSIS: Breast cancer-left, invasive ductal, ER/PR positive   Primary site: Breast (Left)   Staging method: AJCC 7th Edition   Clinical: Stage IA (T1c, N0, cM0) signed by Rulon Eisenmenger, MD on 10/04/2013  1:59 PM   Summary: Stage IA (T1c, N0, cM0)   SUMMARY OF ONCOLOGIC HISTORY:   Breast cancer-left, invasive ductal, ER/PR positive   09/17/2013 Initial Diagnosis Left breast: invasive ductal cancer ER 100% PR 98% HER-2 amplified ratio 2.6 with gene copy #3.25 Ki-67 is 78%   09/27/2013 Breast MRI Left breast 1 cm mass with linear non-mass enhancement consistent with biopsy-proven DCIS dispense 1.8 cm. No pathologic lymphadenopathy   10/18/2013 Surgery left breast lumpectomy invasive ductal carcinoma grade 2.4 cm with high-grade DCIS 1 sentinel node negative, ER 100% PR 98% HER-2 amplified ratio 2.6 Ki-67 78% T2, N0, M0 stage II A.    CHIEF COMPLIANT: Patient is here to start cycle 1 of Abraxane Herceptin  INTERVAL HISTORY: Colleen Galloway is a 79 year old Caucasian with above-mentioned history of left-sided breast cancer that was ER/PR and HER-2 positive. After much discussion we agreed to do systemic adjuvant chemotherapy with Abraxane Herceptin weekly x12 followed by Herceptin maintenance for one year. She had a port placement an echocardiogram both of which went successfully. Her ejection fraction was 55-60%. She is recovering very well from the port placement. She is here to start cycle 1 of chemotherapy.  REVIEW OF SYSTEMS:   Constitutional: Denies fevers, chills or abnormal weight loss Eyes: Denies blurriness of vision Ears, nose, mouth, throat, and face: Denies mucositis or sore throat Respiratory: Denies cough, dyspnea or wheezes Cardiovascular: Denies palpitation, chest discomfort or  lower extremity swelling Gastrointestinal:  Denies nausea, heartburn or change in bowel habits Skin: Denies abnormal skin rashes Lymphatics: Denies new lymphadenopathy or easy bruising Neurological:Denies numbness, tingling or new weaknesses Behavioral/Psych: Mood is stable, no new changes  Breast:  denies any pain or lumps or nodules in either breasts All other systems were reviewed with the patient and are negative.  I have reviewed the past medical history, past surgical history, social history and family history with the patient and they are unchanged from previous note.  ALLERGIES:  is allergic to diltiazem hcl; flecainide; lopressor; nebivolol; and sodium pantothenate.  MEDICATIONS:  Current Outpatient Prescriptions  Medication Sig Dispense Refill  . acetaminophen (TYLENOL) 500 MG tablet Take 500 mg by mouth every 6 (six) hours as needed.      . calcium citrate-vitamin D (CITRACAL+D) 315-200 MG-UNIT per tablet Take 1 tablet by mouth daily.       . chlorthalidone (HYGROTON) 25 MG tablet Take 25 mg by mouth daily as needed (for fluid).       Marland Kitchen dexamethasone (DECADRON) 4 MG tablet Take 2 tablets (8 mg total) by mouth 2 (two) times daily with a meal. Start the day after chemotherapy for 2 days. Take with food.  30 tablet  1  . doxycycline (ADOXA) 50 MG tablet Take 50 mg by mouth daily as needed (for rosacea).       . lidocaine-prilocaine (EMLA) cream Apply 1 application topically as needed.  30 g  0  . LORazepam (ATIVAN) 0.5 MG tablet Take 1 tablet (0.5 mg total) by mouth every 6 (six) hours as needed (Nausea or vomiting).  30 tablet  0  .  meclizine (ANTIVERT) 25 MG tablet Take 1 tablet (25 mg total) by mouth 3 (three) times daily as needed for dizziness.  90 tablet  2  . meloxicam (MOBIC) 15 MG tablet Take 15 mg by mouth daily as needed for pain.       . metoprolol succinate (TOPROL-XL) 25 MG 24 hr tablet Take 1 tablet (25 mg total) by mouth daily.  90 tablet  1  . Multiple Vitamin  (MULTIVITAMIN) capsule Take 1 capsule by mouth daily.        . Naphazoline-Pheniramine (OPCON-A) 0.027-0.315 % SOLN Place 1 drop into the left eye 2 (two) times daily as needed (for allergies).      . Omega-3 Fatty Acids (FISH OIL) 1200 MG CAPS Take 1 capsule by mouth every morning.       . Omega-3 Fatty Acids (SALMON OIL-1000) 200 MG CAPS Take 1 capsule by mouth every evening.       Marland Kitchen omeprazole (PRILOSEC) 20 MG capsule Take 20 mg by mouth daily.        . ondansetron (ZOFRAN) 8 MG tablet Take 1 tablet (8 mg total) by mouth 2 (two) times daily. Start the day after chemo for 2 days. Then take as needed for nausea or vomiting.  30 tablet  1  . prochlorperazine (COMPAZINE) 10 MG tablet Take 1 tablet (10 mg total) by mouth every 6 (six) hours as needed (Nausea or vomiting).  30 tablet  1  . Propylene Glycol (SYSTANE BALANCE) 0.6 % SOLN Apply 1 drop to eye 2 (two) times daily.      . traZODone (DESYREL) 50 MG tablet Take 50-100 mg by mouth at bedtime.       . Vaginal Lubricant (REPLENS) GEL Place 1 Applicatorful vaginally daily as needed (for dryness).      . vitamin C (ASCORBIC ACID) 500 MG tablet Take 500 mg by mouth daily.         No current facility-administered medications for this visit.   Facility-Administered Medications Ordered in Other Visits  Medication Dose Route Frequency Provider Last Rate Last Dose  . sodium chloride 0.9 % injection 10 mL  10 mL Intracatheter PRN Rulon Eisenmenger, MD   10 mL at 11/21/13 1437    PHYSICAL EXAMINATION: ECOG PERFORMANCE STATUS: 0 - Asymptomatic  Filed Vitals:   11/21/13 0932  BP: 140/69  Pulse: 70  Temp: 98.3 F (36.8 C)  Resp: 18   Filed Weights   11/21/13 0932  Weight: 169 lb 12.8 oz (77.021 kg)    GENERAL:alert, no distress and comfortable SKIN: skin color, texture, turgor are normal, no rashes or significant lesions EYES: normal, Conjunctiva are pink and non-injected, sclera clear OROPHARYNX:no exudate, no erythema and lips, buccal  mucosa, and tongue normal  NECK: supple, thyroid normal size, non-tender, without nodularity LYMPH:  no palpable lymphadenopathy in the cervical, axillary or inguinal LUNGS: clear to auscultation and percussion with normal breathing effort HEART: regular rate & rhythm and no murmurs and no lower extremity edema ABDOMEN:abdomen soft, non-tender and normal bowel sounds Musculoskeletal:no cyanosis of digits and no clubbing  NEURO: alert & oriented x 3 with fluent speech, no focal motor/sensory deficits BREAST: No palpable masses or nodules in either right or left breasts. No palpable axillary supraclavicular or infraclavicular adenopathy no breast tenderness or nipple discharge.   LABORATORY DATA:  I have reviewed the data as listed   Chemistry      Component Value Date/Time   NA 141 11/21/2013 0922   NA  140 10/15/2013 1308   K 3.8 11/21/2013 0922   K 4.3 10/15/2013 1308   CL 102 10/15/2013 1308   CO2 26 11/21/2013 0922   CO2 26 10/15/2013 1308   BUN 12.2 11/21/2013 0922   BUN 12 10/15/2013 1308   CREATININE 0.8 11/21/2013 0922   CREATININE 0.81 10/15/2013 1308      Component Value Date/Time   CALCIUM 9.8 11/21/2013 0922   CALCIUM 9.8 10/15/2013 1308   ALKPHOS 121 11/21/2013 0922   ALKPHOS 92 10/15/2013 1308   AST 31 11/21/2013 0922   AST 37 10/15/2013 1308   ALT 33 11/21/2013 0922   ALT 32 10/15/2013 1308   BILITOT 0.27 11/21/2013 0922   BILITOT 0.4 10/15/2013 1308       Lab Results  Component Value Date   WBC 5.3 11/21/2013   HGB 13.3 11/21/2013   HCT 41.1 11/21/2013   MCV 90.6 11/21/2013   PLT 160 11/21/2013   NEUTROABS 3.4 11/21/2013     RADIOGRAPHIC STUDIES: I have personally reviewed the radiology reports and agreed with their findings. No results found.   ASSESSMENT & PLAN:  Breast cancer-left, invasive ductal, ER/PR positive Left breast invasive ductal carcinoma ER/PR HER-2 positive status post left breast lumpectomy: I discussed with her that she has a stage II A. breast cancer based on  that 2.4 cm tumor  Patient is starting cycle 1 of Abraxane Herceptin weekly x12 chemotherapy today. All of her questions have been answered. She has signed consent form. She will return back to see [place] in one week for toxicity check and blood count checks.    Orders Placed This Encounter  Procedures  . CBC with Differential    Standing Status: Future     Number of Occurrences:      Standing Expiration Date: 11/21/2014  . Comprehensive metabolic panel (Cmet) - [facility]    Standing Status: Future     Number of Occurrences:      Standing Expiration Date: 11/21/2014   The patient has a good understanding of the overall plan. she agrees with it. She will call with any problems that may develop before her next visit here.  I spent 25 minutes counseling the patient face to face. The total time spent in the appointment was 30 minutes and more than 50% was on counseling and review of test results    Rulon Eisenmenger, MD 11/21/2013 3:00 PM

## 2013-11-22 ENCOUNTER — Other Ambulatory Visit: Payer: Self-pay | Admitting: Hematology and Oncology

## 2013-11-22 ENCOUNTER — Telehealth: Payer: Self-pay | Admitting: *Deleted

## 2013-11-22 NOTE — Telephone Encounter (Signed)
Called patient for chemo follow up. States she did very well, was very tired last night a bedtime. Denies any N/V or diarrhea. Was slightly constipated this am, but had good bm. Slight sore throat in evening, now resolved

## 2013-11-22 NOTE — Telephone Encounter (Signed)
Message copied by Patton Salles on Fri Nov 22, 2013 10:36 AM ------      Message from: Cora Collum      Created: Thu Nov 21, 2013 11:23 AM      Contact: [phone]       Ist time  Abraxane and Herceptin ------

## 2013-11-28 ENCOUNTER — Telehealth: Payer: Self-pay | Admitting: Hematology and Oncology

## 2013-11-28 ENCOUNTER — Ambulatory Visit (HOSPITAL_BASED_OUTPATIENT_CLINIC_OR_DEPARTMENT_OTHER): Payer: Commercial Managed Care - HMO

## 2013-11-28 ENCOUNTER — Ambulatory Visit (HOSPITAL_BASED_OUTPATIENT_CLINIC_OR_DEPARTMENT_OTHER): Payer: Commercial Managed Care - HMO | Admitting: Adult Health

## 2013-11-28 ENCOUNTER — Other Ambulatory Visit (HOSPITAL_BASED_OUTPATIENT_CLINIC_OR_DEPARTMENT_OTHER): Payer: Commercial Managed Care - HMO

## 2013-11-28 ENCOUNTER — Encounter: Payer: Self-pay | Admitting: Adult Health

## 2013-11-28 ENCOUNTER — Encounter: Payer: Self-pay | Admitting: Hematology and Oncology

## 2013-11-28 VITALS — BP 144/67 | HR 76 | Temp 98.4°F | Resp 18 | Ht 64.0 in | Wt 166.1 lb

## 2013-11-28 DIAGNOSIS — C50212 Malignant neoplasm of upper-inner quadrant of left female breast: Secondary | ICD-10-CM

## 2013-11-28 DIAGNOSIS — C50912 Malignant neoplasm of unspecified site of left female breast: Secondary | ICD-10-CM

## 2013-11-28 DIAGNOSIS — K5900 Constipation, unspecified: Secondary | ICD-10-CM

## 2013-11-28 DIAGNOSIS — Z5111 Encounter for antineoplastic chemotherapy: Secondary | ICD-10-CM

## 2013-11-28 DIAGNOSIS — C50919 Malignant neoplasm of unspecified site of unspecified female breast: Secondary | ICD-10-CM

## 2013-11-28 DIAGNOSIS — Z170 Estrogen receptor positive status [ER+]: Secondary | ICD-10-CM

## 2013-11-28 DIAGNOSIS — Z5112 Encounter for antineoplastic immunotherapy: Secondary | ICD-10-CM

## 2013-11-28 LAB — COMPREHENSIVE METABOLIC PANEL (CC13)
ALK PHOS: 96 U/L (ref 40–150)
ALT: 30 U/L (ref 0–55)
ANION GAP: 10 meq/L (ref 3–11)
AST: 22 U/L (ref 5–34)
Albumin: 3.5 g/dL (ref 3.5–5.0)
BUN: 17.8 mg/dL (ref 7.0–26.0)
CO2: 25 meq/L (ref 22–29)
CREATININE: 0.9 mg/dL (ref 0.6–1.1)
Calcium: 9.7 mg/dL (ref 8.4–10.4)
Chloride: 106 mEq/L (ref 98–109)
Glucose: 118 mg/dl (ref 70–140)
Potassium: 4.1 mEq/L (ref 3.5–5.1)
SODIUM: 141 meq/L (ref 136–145)
TOTAL PROTEIN: 6.8 g/dL (ref 6.4–8.3)
Total Bilirubin: 0.4 mg/dL (ref 0.20–1.20)

## 2013-11-28 LAB — CBC WITH DIFFERENTIAL/PLATELET
BASO%: 1 % (ref 0.0–2.0)
Basophils Absolute: 0 10*3/uL (ref 0.0–0.1)
EOS%: 1.6 % (ref 0.0–7.0)
Eosinophils Absolute: 0.1 10*3/uL (ref 0.0–0.5)
HEMATOCRIT: 41 % (ref 34.8–46.6)
HGB: 13.3 g/dL (ref 11.6–15.9)
LYMPH%: 25.5 % (ref 14.0–49.7)
MCH: 29.4 pg (ref 25.1–34.0)
MCHC: 32.5 g/dL (ref 31.5–36.0)
MCV: 90.5 fL (ref 79.5–101.0)
MONO#: 0.1 10*3/uL (ref 0.1–0.9)
MONO%: 3.1 % (ref 0.0–14.0)
NEUT#: 3.4 10*3/uL (ref 1.5–6.5)
NEUT%: 68.8 % (ref 38.4–76.8)
PLATELETS: 173 10*3/uL (ref 145–400)
RBC: 4.53 10*6/uL (ref 3.70–5.45)
RDW: 14.2 % (ref 11.2–14.5)
WBC: 4.9 10*3/uL (ref 3.9–10.3)
lymph#: 1.2 10*3/uL (ref 0.9–3.3)

## 2013-11-28 MED ORDER — HEPARIN SOD (PORK) LOCK FLUSH 100 UNIT/ML IV SOLN
500.0000 [IU] | Freq: Once | INTRAVENOUS | Status: AC | PRN
  Administered 2013-11-28: 500 [IU]
  Filled 2013-11-28: qty 5

## 2013-11-28 MED ORDER — DEXAMETHASONE SODIUM PHOSPHATE 10 MG/ML IJ SOLN
10.0000 mg | Freq: Once | INTRAMUSCULAR | Status: AC
  Administered 2013-11-28: 10 mg via INTRAVENOUS

## 2013-11-28 MED ORDER — ONDANSETRON 8 MG/NS 50 ML IVPB
INTRAVENOUS | Status: AC
  Filled 2013-11-28: qty 8

## 2013-11-28 MED ORDER — PACLITAXEL PROTEIN-BOUND CHEMO INJECTION 100 MG
100.0000 mg/m2 | Freq: Once | INTRAVENOUS | Status: AC
  Administered 2013-11-28: 175 mg via INTRAVENOUS
  Filled 2013-11-28: qty 35

## 2013-11-28 MED ORDER — ACETAMINOPHEN 325 MG PO TABS: 650.0000 mg | ORAL_TABLET | Freq: Once | ORAL | Status: DC

## 2013-11-28 MED ORDER — DIPHENHYDRAMINE HCL 25 MG PO CAPS: 50.0000 mg | ORAL_CAPSULE | Freq: Once | ORAL | Status: DC

## 2013-11-28 MED ORDER — TRASTUZUMAB CHEMO INJECTION 440 MG
2.0000 mg/kg | Freq: Once | INTRAVENOUS | Status: AC
  Administered 2013-11-28: 147 mg via INTRAVENOUS
  Filled 2013-11-28: qty 7

## 2013-11-28 MED ORDER — DEXAMETHASONE SODIUM PHOSPHATE 10 MG/ML IJ SOLN
INTRAMUSCULAR | Status: AC
  Filled 2013-11-28: qty 1

## 2013-11-28 MED ORDER — ONDANSETRON 8 MG/50ML IVPB (CHCC)
8.0000 mg | Freq: Once | INTRAVENOUS | Status: AC
  Administered 2013-11-28: 8 mg via INTRAVENOUS

## 2013-11-28 MED ORDER — SODIUM CHLORIDE 0.9 % IJ SOLN
10.0000 mL | INTRAMUSCULAR | Status: DC | PRN
  Administered 2013-11-28: 10 mL
  Filled 2013-11-28: qty 10

## 2013-11-28 MED ORDER — SODIUM CHLORIDE 0.9 % IV SOLN
Freq: Once | INTRAVENOUS | Status: AC
  Administered 2013-11-28: 11:00:00 via INTRAVENOUS

## 2013-11-28 NOTE — Patient Instructions (Signed)
[facility] Discharge Instructions for Patients Receiving Chemotherapy  Today you received the following chemotherapy agents Abraxane and Herceptin  To help prevent nausea and vomiting after your treatment, we encourage you to take your nausea medicatio Zofran and Compazine as directed   If you develop nausea and vomiting that is not controlled by your nausea medication, call the clinic.   BELOW ARE SYMPTOMS THAT SHOULD BE REPORTED IMMEDIATELY:  *FEVER GREATER THAN 100.5 F  *CHILLS WITH OR WITHOUT FEVER  NAUSEA AND VOMITING THAT IS NOT CONTROLLED WITH YOUR NAUSEA MEDICATION  *UNUSUAL SHORTNESS OF BREATH  *UNUSUAL BRUISING OR BLEEDING  TENDERNESS IN MOUTH AND THROAT WITH OR WITHOUT PRESENCE OF ULCERS  *URINARY PROBLEMS  *BOWEL PROBLEMS  UNUSUAL RASH Items with * indicate a potential emergency and should be followed up as soon as possible.  Feel free to call the clinic you have any questions or concerns. The clinic phone number is [phone].

## 2013-11-28 NOTE — Progress Notes (Signed)
Patient Care Team: Nicoletta Dress, MD as PCP - General (Internal Medicine) Deboraha Sprang, MD as Attending Physician (Cardiology) Rulon Eisenmenger, MD as Consulting Physician (Hematology and Oncology)  DIAGNOSIS: Breast cancer-left, invasive ductal, ER/PR positive   Primary site: Breast (Left)   Staging method: AJCC 7th Edition   Clinical: Stage IA (T1c, N0, cM0) signed by Rulon Eisenmenger, MD on 10/04/2013  1:59 PM   Summary: Stage IA (T1c, N0, cM0)   SUMMARY OF ONCOLOGIC HISTORY:   Breast cancer-left, invasive ductal, ER/PR positive   09/17/2013 Initial Diagnosis Left breast: invasive ductal cancer ER 100% PR 98% HER-2 amplified ratio 2.6 with gene copy #3.25 Ki-67 is 78%   09/27/2013 Breast MRI Left breast 1 cm mass with linear non-mass enhancement consistent with biopsy-proven DCIS dispense 1.8 cm. No pathologic lymphadenopathy   10/18/2013 Surgery left breast lumpectomy invasive ductal carcinoma grade 2.4 cm with high-grade DCIS 1 sentinel node negative, ER 100% PR 98% HER-2 amplified ratio 2.6 Ki-67 78% T2, N0, M0 stage II A.    CHIEF COMPLIANT: Adjuvant chemotherapy  INTERVAL HISTORY: Colleen Galloway is a 79 year old Caucasian with above-mentioned history of left-sided breast cancer that was ER/PR and HER-2 positive.  She is here prior to cycle two of weekly Abraxane and Herceptin.  She is doing well today.  She did have some mild nausea following treatment and did have some constipation.  She has been eating prunes and drinking increased amounts of water, and has noticed that her constipation is better. Her feet did get swollen around 10/10 and 10/11 and she took chlorthalidone which helped relieve it.  She brought a blood pressure log, and her bps have been ranging from 115-126/57-66, her HR ranged from 60-68.  She did have one episode of feeling cold and this was relieved by covering up with a blanket.  She denies fevers, chills, nausea, vomiting, constipation, diarrhea, numbness/tingling,  skin changes or any further concerns.  She does have h/o Afib and diastolic heart failure as noted below.     REVIEW OF SYSTEMS:   A 10 point review of systems was conducted and is otherwise negative except for what is noted above.    Past Medical History  Diagnosis Date  . Atrial fibrillation   . Lumbosacral spondylosis without myelopathy   . Carotid artery disease     Followed by Dr. Scot Dock  . Insomnia, unspecified   . Esophageal reflux   . Fibrocystic disease of breast   . Osteoarthrosis, unspecified whether generalized or localized, unspecified site   . Urinary tract infection   . Arthritis   . Unspecified sinusitis (chronic)   . Orthostatic lightheadedness 10/20/2011  . Calculus of kidney     kidney stones  . Breast cancer 09/17/13    Left iNVASIVE DUCTAL,dcis  . Allergy   . PONV (postoperative nausea and vomiting)   . Dysrhythmia   . CHF (congestive heart failure)    Past Surgical History  Procedure Laterality Date  . Dilation and curettage of uterus  1960  . Kidney stone surgery    . Varicose veins    . Breast lumpectomy with needle localization and axillary sentinel lymph node bx Left 10/18/2013    Procedure: LEFT AXILLARY LYMPHATIC MAPPING; INJECTION OF METHYLENE BLUE INTO LEFT BREAST; LEFT BREAST PARTIAL MASTECTOMY AFTER NEEDLE LOCALIZATION; AXILLARY SENTINEL LYMPH NODE BIOPSY;  Surgeon: Jackolyn Confer, MD;  Location: [facility];  Service: General;  Laterality: Left;  . Portacath placement N/A 10/18/2013    Procedure:  INSERTION PORT-A-CATH/ULTRASOUND GUIDED,RIGHT INTERNAL JUGULAR;  Surgeon: Jackolyn Confer, MD;  Location: [facility];  Service: General;  Laterality: N/A;   History   Social History  . Marital Status: Widowed    Spouse Name: N/A    Number of Children: N/A  . Years of Education: N/A   Occupational History  . Not on file.   Social History Main Topics  . Smoking status: Never Smoker   . Smokeless tobacco: Never Used  . Alcohol Use: No  . Drug Use: No  .  Sexual Activity: Not on file   Other Topics Concern  . Not on file   Social History Narrative   Non smoker/ no tobacco use   Current work/retired   Martial status/widowed   Non drinker/ no alcohol use   Seat belt use/ always wears seatbelt   Caffeine use   Guns in the home         Family History  Problem Relation Age of Onset  . Heart disease Mother   . Asthma Mother   . Hypertension Mother   . Cancer Mother   . Deep vein thrombosis Mother   . Other Mother     varicose veins  . Heart disease Father   . Hypertension Father   . Stroke Father   . Heart disease Sister   . Hypertension Sister   . Hyperlipidemia Sister   . Heart disease Brother   . Asthma Brother   . Hypertension Brother   . Deep vein thrombosis Brother   . Diabetes Brother   . Heart disease Other   . Hypertension Other   . Colon cancer Other     ALLERGIES:  is allergic to diltiazem hcl; flecainide; lopressor; nebivolol; and sodium pantothenate.  MEDICATIONS:  Current Outpatient Prescriptions  Medication Sig Dispense Refill  . acetaminophen (TYLENOL) 500 MG tablet Take 500 mg by mouth every 6 (six) hours as needed.      . calcium citrate-vitamin D (CITRACAL+D) 315-200 MG-UNIT per tablet Take 1 tablet by mouth daily.       . chlorthalidone (HYGROTON) 25 MG tablet Take 25 mg by mouth daily as needed (for fluid).       Marland Kitchen dexamethasone (DECADRON) 4 MG tablet Take 2 tablets (8 mg total) by mouth 2 (two) times daily with a meal. Start the day after chemotherapy for 2 days. Take with food.  30 tablet  1  . doxycycline (ADOXA) 50 MG tablet Take 50 mg by mouth daily as needed (for rosacea).       . lidocaine-prilocaine (EMLA) cream Apply 1 application topically as needed.  30 g  0  . LORazepam (ATIVAN) 0.5 MG tablet Take 1 tablet (0.5 mg total) by mouth every 6 (six) hours as needed (Nausea or vomiting).  30 tablet  0  . meclizine (ANTIVERT) 25 MG tablet Take 1 tablet (25 mg total) by mouth 3 (three) times daily  as needed for dizziness.  90 tablet  2  . meloxicam (MOBIC) 15 MG tablet Take 15 mg by mouth daily as needed for pain.       . metoprolol succinate (TOPROL-XL) 25 MG 24 hr tablet Take 1 tablet (25 mg total) by mouth daily.  90 tablet  1  . Multiple Vitamin (MULTIVITAMIN) capsule Take 1 capsule by mouth daily.        . Naphazoline-Pheniramine (OPCON-A) 0.027-0.315 % SOLN Place 1 drop into the left eye 2 (two) times daily as needed (for allergies).      Marland Kitchen  Omega-3 Fatty Acids (FISH OIL) 1200 MG CAPS Take 1 capsule by mouth every morning.       . Omega-3 Fatty Acids (SALMON OIL-1000) 200 MG CAPS Take 1 capsule by mouth every evening.       Marland Kitchen omeprazole (PRILOSEC) 20 MG capsule Take 20 mg by mouth daily.        . ondansetron (ZOFRAN) 8 MG tablet Take 1 tablet (8 mg total) by mouth 2 (two) times daily. Start the day after chemo for 2 days. Then take as needed for nausea or vomiting.  30 tablet  1  . prochlorperazine (COMPAZINE) 10 MG tablet Take 1 tablet (10 mg total) by mouth every 6 (six) hours as needed (Nausea or vomiting).  30 tablet  1  . Propylene Glycol (SYSTANE BALANCE) 0.6 % SOLN Apply 1 drop to eye 2 (two) times daily.      Marland Kitchen tobramycin (TOBREX) 0.3 % ophthalmic solution Place 1 drop into the left eye 4 (four) times daily.      . traZODone (DESYREL) 50 MG tablet Take 50-100 mg by mouth at bedtime.       . Vaginal Lubricant (REPLENS) GEL Place 1 Applicatorful vaginally daily as needed (for dryness).      . vitamin C (ASCORBIC ACID) 500 MG tablet Take 500 mg by mouth daily.         No current facility-administered medications for this visit.    PHYSICAL EXAMINATION: ECOG PERFORMANCE STATUS: 0 - Asymptomatic  Filed Vitals:   11/28/13 0947  BP: 144/67  Pulse: 76  Temp: 98.4 F (36.9 C)  Resp: 18   Filed Weights   11/28/13 0947  Weight: 166 lb 1.6 oz (75.342 kg)   GENERAL: Patient is a well appearing female in no acute distress HEENT:  Sclerae anicteric.  Oropharynx clear and  moist. No ulcerations or evidence of oropharyngeal candidiasis. Neck is supple.  NODES:  No cervical, supraclavicular, or axillary lymphadenopathy palpated.  BREAST EXAM:  Deferred. LUNGS:  Clear to auscultation bilaterally.  No wheezes or rhonchi. HEART:   No murmur appreciated. ABDOMEN:  Soft, nontender.  Positive, normoactive bowel sounds. No organomegaly palpated. MSK:  No focal spinal tenderness to palpation. Full range of motion bilaterally in the upper extremities. EXTREMITIES:  No peripheral edema.   SKIN:  Clear with no obvious rashes or skin changes. No nail dyscrasia. NEURO:  Nonfocal. Well oriented.  Appropriate affect.    LABORATORY DATA:  I have reviewed the data as listed   Chemistry      Component Value Date/Time   NA 141 11/28/2013 0929   NA 140 10/15/2013 1308   K 4.1 11/28/2013 0929   K 4.3 10/15/2013 1308   CL 102 10/15/2013 1308   CO2 25 11/28/2013 0929   CO2 26 10/15/2013 1308   BUN 17.8 11/28/2013 0929   BUN 12 10/15/2013 1308   CREATININE 0.9 11/28/2013 0929   CREATININE 0.81 10/15/2013 1308      Component Value Date/Time   CALCIUM 9.7 11/28/2013 0929   CALCIUM 9.8 10/15/2013 1308   ALKPHOS 96 11/28/2013 0929   ALKPHOS 92 10/15/2013 1308   AST 22 11/28/2013 0929   AST 37 10/15/2013 1308   ALT 30 11/28/2013 0929   ALT 32 10/15/2013 1308   BILITOT 0.40 11/28/2013 0929   BILITOT 0.4 10/15/2013 1308       Lab Results  Component Value Date   WBC 4.9 11/28/2013   HGB 13.3 11/28/2013   HCT 41.0 11/28/2013  MCV 90.5 11/28/2013   PLT 173 11/28/2013   NEUTROABS 3.4 11/28/2013     RADIOGRAPHIC STUDIES: *[facility] [employer]. [place], [place] [address] [phone]  ------------------------------------------------------------------- Transthoracic Echocardiography  Patient: Derita, Michelsen MR #: [id number] Study Date: 11/07/2013 Gender: F Age: 82 Height: 162.6 cm Weight: 74.4 kg BSA: 1.85 m^2 Pt.  Status: Room:  PERFORMING [facility], Outpatient SONOGRAPHER Darlina Sicilian, RDCS ORDERING Lindi Adie, Vinay K REFERRING Nicholas Lose K  cc:  ------------------------------------------------------------------- LV EF: 55% - 60%  ------------------------------------------------------------------- Indications: V58.11 Chemotherapy Evaluation.  ------------------------------------------------------------------- History: PMH: No prior cardiac history.  ------------------------------------------------------------------- Study Conclusions  - Left ventricle: The cavity size was normal. Wall thickness was normal. Systolic function was normal. The estimated ejection fraction was in the range of 55% to 60%. - Left atrium: The atrium was mildly dilated. - Pulmonary arteries: PA peak pressure: 34 mm Hg (S).  Transthoracic echocardiography. M-mode, complete 2D, spectral Doppler, and color Doppler. Birthdate: Patient birthdate: 1934/03/22. Age: Patient is 79 yr old. Sex: Gender: female. BMI: 28.2 kg/m^2. Blood pressure: 143/63 Patient status: Inpatient. Study date: Study date: 11/07/2013. Study time: 10:45 AM. Location: Echo laboratory.  -------------------------------------------------------------------  ------------------------------------------------------------------- Left ventricle: The cavity size was normal. Wall thickness was normal. Systolic function was normal. The estimated ejection fraction was in the range of 55% to 60%.  ------------------------------------------------------------------- Aortic valve: Structurally normal valve. Cusp separation was normal. Doppler: Transvalvular velocity was within the normal range. There was no stenosis. There was no regurgitation.  ------------------------------------------------------------------- Mitral valve: Mildly thickened leaflets . Doppler: There was trivial regurgitation. Peak gradient (D): 3 mm  Hg.  ------------------------------------------------------------------- Left atrium: The atrium was mildly dilated.  ------------------------------------------------------------------- Right ventricle: The cavity size was normal. Wall thickness was normal. Systolic function was normal.  ------------------------------------------------------------------- Pulmonic valve: Structurally normal valve. Cusp separation was normal. Doppler: Transvalvular velocity was within the normal range. There was no regurgitation.  ------------------------------------------------------------------- Tricuspid valve: Structurally normal valve. Leaflet separation was normal. Doppler: Transvalvular velocity was within the normal range. There was mild regurgitation.  ------------------------------------------------------------------- Right atrium: The atrium was normal in size.  ------------------------------------------------------------------- Pericardium: There was no pericardial effusion.  ------------------------------------------------------------------- Measurements  Left ventricle Value Reference LV ID, ED, PLAX chordal 48 mm 43 - 52 LV ID, ES, PLAX chordal 24.9 mm 23 - 38 LV fx shortening, PLAX chordal 48 % >=29 LV PW thickness, ED 8.86 mm --------- IVS/LV PW ratio, ED 0.97 <=1.3 LV e&', lateral 11.9 cm/s --------- LV E/e&', lateral 7.58 --------- LV e&', medial 8.7 cm/s --------- LV E/e&', medial 10.37 --------- LV e&', average 10.3 cm/s --------- LV E/e&', average 8.76 ---------  Ventricular septum Value Reference IVS thickness, ED 8.55 mm ---------  Aorta Value Reference Aortic root ID, ED 26 mm ---------  Left atrium Value Reference LA ID, A-P, ES 44 mm --------- LA ID/bsa, A-P (H) 2.37 cm/m^2 <=2.2 LA volume/bsa, S 36.9 ml/m^2 --------- LA volume, ES, 1-p A4C 65.4 ml --------- LA volume/bsa, ES, 1-p A4C 35.3 ml/m^2 --------- LA volume, ES, 1-p A2C 66.3 ml --------- LA  volume/bsa, ES, 1-p A2C 35.8 ml/m^2 ---------  Mitral valve Value Reference Mitral E-wave peak velocity 90.2 cm/s --------- Mitral A-wave peak velocity 58.7 cm/s --------- Mitral deceleration time 169 ms 150 - 230 Mitral peak gradient, D 3 mm Hg --------- Mitral E/A ratio, peak 1.5 ---------  Pulmonary arteries Value Reference PA pressure, S, DP (H) 34 mm Hg <=30  Tricuspid valve Value Reference Tricuspid regurg peak velocity 278 cm/s --------- Tricuspid peak RV-RA  gradient 31 mm Hg ---------  Systemic veins Value Reference Estimated CVP 3 mm Hg ---------  Right ventricle Value Reference RV pressure, S, DP (H) 34 mm Hg <=30 RV s&', lateral, S 17 cm/s ---------  Legend: (L) and (H) mark values outside specified reference range.  ------------------------------------------------------------------- Prepared and Electronically Authenticated by  Dorris Carnes, M.D. 2015-09-24T15:30:22   ASSESSMENT & PLAN:  79 year old woman with stage IA left breast triple positive invasive ductal carcinoma who is here for adjuvant chemotherapy.  Her labs are normal today and I reviewed them with her in detail.  She tolerated chemotherapy well for the most part.  I recommended that she continue prunes and water for constipation as it was likely a side effect of the anti-emetics with treatment.  Should this not work, I recommended Senokot S 1tab daily to BID if needed.  She will proceed with week 2 of Abraxane and Herceptin therapy today.    Her last echocardiogram was on 11/07/2013 and demonstrated a LVEF of 55-60%.  She is followed by Dr. Caryl Comes for her atrial fibrillation and diastolic heart failure.    The patient has a good understanding of the overall plan. she agrees with it. She will call with any problems that may develop before her next visit here.  I spent 25 minutes counseling the patient face to face. The total time spent in the appointment was 30 minutes and more than 50% was on counseling  and review of test results   Minette Headland, [place] [phone] 11/28/2013 10:26 AM

## 2013-11-28 NOTE — Progress Notes (Signed)
Pt applied for copay assistance and was approved w/ Patient Colleen Galloway.  She is approved for $5000 for her chemo drugs for 12 months from 10/29/13.  $1650 is a guaranteed award and the remaining award balance of $3350 is accessible on a [employer] basis.  I forwarded copy of letter and proof of expenditure form to Rema Jasmine in the billing dept.

## 2013-11-28 NOTE — Patient Instructions (Signed)
You will proceed with treatment today.  I recommend Senokot S, 1 tablet daily to twice a day if you continue to have difficulty with constipation despite prunes and water.  Please call us if you have any questions or concerns.

## 2013-11-28 NOTE — Telephone Encounter (Signed)
Gave avs & cal thru Oct-Dec. Message MW to sch tx thru Dec.-Amber

## 2013-12-04 ENCOUNTER — Other Ambulatory Visit: Payer: Self-pay

## 2013-12-04 DIAGNOSIS — C50919 Malignant neoplasm of unspecified site of unspecified female breast: Secondary | ICD-10-CM

## 2013-12-05 ENCOUNTER — Ambulatory Visit (HOSPITAL_BASED_OUTPATIENT_CLINIC_OR_DEPARTMENT_OTHER): Payer: Commercial Managed Care - HMO

## 2013-12-05 ENCOUNTER — Other Ambulatory Visit (HOSPITAL_BASED_OUTPATIENT_CLINIC_OR_DEPARTMENT_OTHER): Payer: Commercial Managed Care - HMO

## 2013-12-05 ENCOUNTER — Ambulatory Visit (HOSPITAL_BASED_OUTPATIENT_CLINIC_OR_DEPARTMENT_OTHER): Payer: Commercial Managed Care - HMO | Admitting: Hematology and Oncology

## 2013-12-05 VITALS — BP 121/79 | HR 72 | Temp 98.4°F | Resp 18 | Ht 64.0 in | Wt 166.7 lb

## 2013-12-05 DIAGNOSIS — C50512 Malignant neoplasm of lower-outer quadrant of left female breast: Secondary | ICD-10-CM

## 2013-12-05 DIAGNOSIS — I5032 Chronic diastolic (congestive) heart failure: Secondary | ICD-10-CM

## 2013-12-05 DIAGNOSIS — Z5112 Encounter for antineoplastic immunotherapy: Secondary | ICD-10-CM

## 2013-12-05 DIAGNOSIS — Z170 Estrogen receptor positive status [ER+]: Secondary | ICD-10-CM

## 2013-12-05 DIAGNOSIS — Z5111 Encounter for antineoplastic chemotherapy: Secondary | ICD-10-CM

## 2013-12-05 DIAGNOSIS — C50919 Malignant neoplasm of unspecified site of unspecified female breast: Secondary | ICD-10-CM

## 2013-12-05 LAB — CBC WITH DIFFERENTIAL/PLATELET
BASO%: 1 % (ref 0.0–2.0)
Basophils Absolute: 0 10*3/uL (ref 0.0–0.1)
EOS ABS: 0.1 10*3/uL (ref 0.0–0.5)
EOS%: 1.5 % (ref 0.0–7.0)
HCT: 39 % (ref 34.8–46.6)
HGB: 12.7 g/dL (ref 11.6–15.9)
LYMPH%: 33.1 % (ref 14.0–49.7)
MCH: 29.6 pg (ref 25.1–34.0)
MCHC: 32.6 g/dL (ref 31.5–36.0)
MCV: 90.7 fL (ref 79.5–101.0)
MONO#: 0.1 10*3/uL (ref 0.1–0.9)
MONO%: 3.9 % (ref 0.0–14.0)
NEUT%: 60.5 % (ref 38.4–76.8)
NEUTROS ABS: 2.1 10*3/uL (ref 1.5–6.5)
Platelets: 189 10*3/uL (ref 145–400)
RBC: 4.3 10*6/uL (ref 3.70–5.45)
RDW: 14.4 % (ref 11.2–14.5)
WBC: 3.5 10*3/uL — ABNORMAL LOW (ref 3.9–10.3)
lymph#: 1.2 10*3/uL (ref 0.9–3.3)

## 2013-12-05 LAB — COMPREHENSIVE METABOLIC PANEL (CC13)
ALBUMIN: 3.4 g/dL — AB (ref 3.5–5.0)
ALK PHOS: 101 U/L (ref 40–150)
ALT: 31 U/L (ref 0–55)
ANION GAP: 7 meq/L (ref 3–11)
AST: 24 U/L (ref 5–34)
BUN: 14.5 mg/dL (ref 7.0–26.0)
CO2: 26 meq/L (ref 22–29)
Calcium: 10 mg/dL (ref 8.4–10.4)
Chloride: 107 mEq/L (ref 98–109)
Creatinine: 0.8 mg/dL (ref 0.6–1.1)
GLUCOSE: 104 mg/dL (ref 70–140)
POTASSIUM: 4.6 meq/L (ref 3.5–5.1)
Sodium: 140 mEq/L (ref 136–145)
TOTAL PROTEIN: 6.6 g/dL (ref 6.4–8.3)
Total Bilirubin: 0.36 mg/dL (ref 0.20–1.20)

## 2013-12-05 MED ORDER — TRASTUZUMAB CHEMO INJECTION 440 MG
2.0000 mg/kg | Freq: Once | INTRAVENOUS | Status: AC
  Administered 2013-12-05: 147 mg via INTRAVENOUS
  Filled 2013-12-05: qty 7

## 2013-12-05 MED ORDER — HEPARIN SOD (PORK) LOCK FLUSH 100 UNIT/ML IV SOLN
500.0000 [IU] | Freq: Once | INTRAVENOUS | Status: AC | PRN
  Administered 2013-12-05: 500 [IU]
  Filled 2013-12-05: qty 5

## 2013-12-05 MED ORDER — SODIUM CHLORIDE 0.9 % IJ SOLN
10.0000 mL | INTRAMUSCULAR | Status: DC | PRN
  Administered 2013-12-05: 10 mL
  Filled 2013-12-05: qty 10

## 2013-12-05 MED ORDER — ONDANSETRON 8 MG/NS 50 ML IVPB
INTRAVENOUS | Status: AC
Start: 2013-12-05 — End: 2013-12-05
  Filled 2013-12-05: qty 8

## 2013-12-05 MED ORDER — DEXAMETHASONE SODIUM PHOSPHATE 10 MG/ML IJ SOLN
INTRAMUSCULAR | Status: AC
  Filled 2013-12-05: qty 1

## 2013-12-05 MED ORDER — PACLITAXEL PROTEIN-BOUND CHEMO INJECTION 100 MG
100.0000 mg/m2 | Freq: Once | INTRAVENOUS | Status: AC
  Administered 2013-12-05: 175 mg via INTRAVENOUS
  Filled 2013-12-05: qty 35

## 2013-12-05 MED ORDER — DEXAMETHASONE SODIUM PHOSPHATE 10 MG/ML IJ SOLN
10.0000 mg | Freq: Once | INTRAMUSCULAR | Status: AC
  Administered 2013-12-05: 10 mg via INTRAVENOUS

## 2013-12-05 MED ORDER — ONDANSETRON 8 MG/50ML IVPB (CHCC)
8.0000 mg | Freq: Once | INTRAVENOUS | Status: AC
  Administered 2013-12-05: 8 mg via INTRAVENOUS

## 2013-12-05 MED ORDER — SODIUM CHLORIDE 0.9 % IV SOLN
Freq: Once | INTRAVENOUS | Status: AC
  Administered 2013-12-05: 14:00:00 via INTRAVENOUS

## 2013-12-05 NOTE — Progress Notes (Signed)
Pt refused tylenol and benadryl.

## 2013-12-05 NOTE — Assessment & Plan Note (Signed)
Left breast invasive ductal carcinoma ER/PR positive HER-2 positive status post lumpectomy T2, N0, M0 stage II A., 2.4 cm Currently on adjuvant chemotherapy with Abraxane Herceptin she is here today for week 3 of treatment. She has noted that her eyes are watering as well as nose is dripping. She also noted a macular rash on her dorsum of her hands. She has noticed mild hair thinning. Otherwise she is tolerating chemotherapy extremely well. He did not have any nausea vomiting or any cardiac symptoms. I encouraged her to take Claritin-D for the itchy eyes and runny nose.  I reviewed her blood work from today. She is due to go for treatment. We will see her back weekly for followups.

## 2013-12-05 NOTE — Patient Instructions (Signed)
[facility] Discharge Instructions for Patients Receiving Chemotherapy  Today you received the following chemotherapy agents abraxane  To help prevent nausea and vomiting after your treatment, we encourage you to take your nausea medication as prescribed.  If you develop nausea and vomiting that is not controlled by your nausea medication, call the clinic.   BELOW ARE SYMPTOMS THAT SHOULD BE REPORTED IMMEDIATELY:  *FEVER GREATER THAN 100.5 F  *CHILLS WITH OR WITHOUT FEVER  NAUSEA AND VOMITING THAT IS NOT CONTROLLED WITH YOUR NAUSEA MEDICATION  *UNUSUAL SHORTNESS OF BREATH  *UNUSUAL BRUISING OR BLEEDING  TENDERNESS IN MOUTH AND THROAT WITH OR WITHOUT PRESENCE OF ULCERS  *URINARY PROBLEMS  *BOWEL PROBLEMS  UNUSUAL RASH Items with * indicate a potential emergency and should be followed up as soon as possible.   Trastuzumab injection for infusion What is this medicine? TRASTUZUMAB (tras TOO zoo mab) is a monoclonal antibody. It targets a protein called HER2. This protein is found in some stomach and breast cancers. This medicine can stop cancer cell growth. This medicine may be used with other cancer treatments. This medicine may be used for other purposes; ask your health care provider or pharmacist if you have questions. COMMON BRAND NAME(S): Herceptin What should I tell my health care provider before I take this medicine? They need to know if you have any of these conditions: -heart disease -heart failure -infection (especially a virus infection such as chickenpox, cold sores, or herpes) -lung or breathing disease, like asthma -recent or ongoing radiation therapy -an unusual or allergic reaction to trastuzumab, benzyl alcohol, or other medications, foods, dyes, or preservatives -pregnant or trying to get pregnant -breast-feeding How should I use this medicine? This drug is given as an infusion into a vein. It is administered in a hospital or clinic by a  specially trained health care professional. Talk to your pediatrician regarding the use of this medicine in children. This medicine is not approved for use in children. Overdosage: If you think you have taken too much of this medicine contact a poison control center or emergency room at once. NOTE: This medicine is only for you. Do not share this medicine with others. What if I miss a dose? It is important not to miss a dose. Call your doctor or health care professional if you are unable to keep an appointment. What may interact with this medicine? -cyclophosphamide -doxorubicin -warfarin This list may not describe all possible interactions. Give your health care provider a list of all the medicines, herbs, non-prescription drugs, or dietary supplements you use. Also tell them if you smoke, drink alcohol, or use illegal drugs. Some items may interact with your medicine. What should I watch for while using this medicine? Visit your doctor for checks on your progress. Report any side effects. Continue your course of treatment even though you feel ill unless your doctor tells you to stop. Call your doctor or health care professional for advice if you get a fever, chills or sore throat, or other symptoms of a cold or flu. Do not treat yourself. Try to avoid being around people who are sick. You may experience fever, chills and shaking during your first infusion. These effects are usually mild and can be treated with other medicines. Report any side effects during the infusion to your health care professional. Fever and chills usually do not happen with later infusions. What side effects may I notice from receiving this medicine? Side effects that you should report to your doctor  or other health care professional as soon as possible: -breathing difficulties -chest pain or palpitations -cough -dizziness or fainting -fever or chills, sore throat -skin rash, itching or hives -swelling of the legs or  ankles -unusually weak or tired Side effects that usually do not require medical attention (report to your doctor or other health care professional if they continue or are bothersome): -loss of appetite -headache -muscle aches -nausea This list may not describe all possible side effects. Call your doctor for medical advice about side effects. You may report side effects to FDA at 1-800-FDA-1088. Where should I keep my medicine? This drug is given in a hospital or clinic and will not be stored at home. NOTE: This sheet is a summary. It may not cover all possible information. If you have questions about this medicine, talk to your doctor, pharmacist, or health care provider.  2015, Elsevier/[employer] Standard. (2008-12-05 13:43:15)   Feel free to call the clinic you have any questions or concerns. The clinic phone number is [phone].

## 2013-12-05 NOTE — Progress Notes (Signed)
Patient Care Team: Nicoletta Dress, MD as PCP - General (Internal Medicine) Deboraha Sprang, MD as Attending Physician (Cardiology) Rulon Eisenmenger, MD as Consulting Physician (Hematology and Oncology)  DIAGNOSIS: Breast cancer of lower-outer quadrant of left female breast   Primary site: Breast (Left)   Staging method: AJCC 7th Edition   Clinical: Stage IA (T1c, N0, cM0) signed by Rulon Eisenmenger, MD on 10/04/2013  1:59 PM   Summary: Stage IA (T1c, N0, cM0)   SUMMARY OF ONCOLOGIC HISTORY:   Breast cancer of lower-outer quadrant of left female breast   09/17/2013 Initial Diagnosis Left breast: invasive ductal cancer ER 100% PR 98% HER-2 amplified ratio 2.6 with gene copy #3.25 Ki-67 is 78%   09/27/2013 Breast MRI Left breast 1 cm mass with linear non-mass enhancement consistent with biopsy-proven DCIS dispense 1.8 cm. No pathologic lymphadenopathy   10/18/2013 Surgery left breast lumpectomy invasive ductal carcinoma grade 2.4 cm with high-grade DCIS 1 sentinel node negative, ER 100% PR 98% HER-2 amplified ratio 2.6 Ki-67 78% T2, N0, M0 stage II A.    CHIEF COMPLIANT: Patient is here for week 3 of Abraxane Herceptin  INTERVAL HISTORY: Colleen Galloway is a 79 year old Caucasian with above-mentioned history of HER-2 positive breast cancer risk of Abraxane Herceptin. She is tolerating chemotherapy extremely well other than some minor problems like constipation which resolved with Crohn's she has noted that her eyes are watery as well as her nose is runny. She had these symptoms even prior to chemotherapy. She has also noted a macular rash on the dorsum of the hands and both sides that are blanching and red. There is some on her chest as well. Denies any nausea vomiting. She has noticed some thinning of hair.  REVIEW OF SYSTEMS:   Constitutional: Denies fevers, chills or abnormal weight loss Eyes: Denies blurriness of vision Ears, nose, mouth, throat, and face: Denies mucositis or sore  throat Respiratory: Denies cough, dyspnea or wheezes Cardiovascular: Denies palpitation, chest discomfort or lower extremity swelling Gastrointestinal:  Denies nausea, heartburn or change in bowel habits Skin: Denies abnormal skin rashes Lymphatics: Denies new lymphadenopathy or easy bruising Neurological:Denies numbness, tingling or new weaknesses Behavioral/Psych: Mood is stable, no new changes  Breast:  denies any pain or lumps or nodules in either breasts All other systems were reviewed with the patient and are negative.  I have reviewed the past medical history, past surgical history, social history and family history with the patient and they are unchanged from previous note.  ALLERGIES:  is allergic to diltiazem hcl; flecainide; lopressor; nebivolol; and sodium pantothenate.  MEDICATIONS:  Current Outpatient Prescriptions  Medication Sig Dispense Refill  . acetaminophen (TYLENOL) 500 MG tablet Take 500 mg by mouth every 6 (six) hours as needed.      . calcium citrate-vitamin D (CITRACAL+D) 315-200 MG-UNIT per tablet Take 1 tablet by mouth daily.       . chlorthalidone (HYGROTON) 25 MG tablet Take 25 mg by mouth daily as needed (for fluid).       Marland Kitchen dexamethasone (DECADRON) 4 MG tablet Take 2 tablets (8 mg total) by mouth 2 (two) times daily with a meal. Start the day after chemotherapy for 2 days. Take with food.  30 tablet  1  . doxycycline (ADOXA) 50 MG tablet Take 50 mg by mouth daily as needed (for rosacea).       . lidocaine-prilocaine (EMLA) cream Apply 1 application topically as needed.  30 g  0  . LORazepam (  ATIVAN) 0.5 MG tablet Take 1 tablet (0.5 mg total) by mouth every 6 (six) hours as needed (Nausea or vomiting).  30 tablet  0  . meclizine (ANTIVERT) 25 MG tablet Take 1 tablet (25 mg total) by mouth 3 (three) times daily as needed for dizziness.  90 tablet  2  . meloxicam (MOBIC) 15 MG tablet Take 15 mg by mouth daily as needed for pain.       . metoprolol succinate  (TOPROL-XL) 25 MG 24 hr tablet Take 1 tablet (25 mg total) by mouth daily.  90 tablet  1  . Multiple Vitamin (MULTIVITAMIN) capsule Take 1 capsule by mouth daily.        . Naphazoline-Pheniramine (OPCON-A) 0.027-0.315 % SOLN Place 1 drop into the left eye 2 (two) times daily as needed (for allergies).      . Omega-3 Fatty Acids (FISH OIL) 1200 MG CAPS Take 1 capsule by mouth every morning.       . Omega-3 Fatty Acids (SALMON OIL-1000) 200 MG CAPS Take 1 capsule by mouth every evening.       Marland Kitchen omeprazole (PRILOSEC) 20 MG capsule Take 20 mg by mouth daily.        . ondansetron (ZOFRAN) 8 MG tablet Take 1 tablet (8 mg total) by mouth 2 (two) times daily. Start the day after chemo for 2 days. Then take as needed for nausea or vomiting.  30 tablet  1  . prochlorperazine (COMPAZINE) 10 MG tablet Take 1 tablet (10 mg total) by mouth every 6 (six) hours as needed (Nausea or vomiting).  30 tablet  1  . Propylene Glycol (SYSTANE BALANCE) 0.6 % SOLN Apply 1 drop to eye 2 (two) times daily.      Marland Kitchen tobramycin (TOBREX) 0.3 % ophthalmic solution Place 1 drop into the left eye 4 (four) times daily.      . traZODone (DESYREL) 50 MG tablet Take 50-100 mg by mouth at bedtime.       . Vaginal Lubricant (REPLENS) GEL Place 1 Applicatorful vaginally daily as needed (for dryness).      . vitamin C (ASCORBIC ACID) 500 MG tablet Take 500 mg by mouth daily.         No current facility-administered medications for this visit.    PHYSICAL EXAMINATION: ECOG PERFORMANCE STATUS: 1 - Symptomatic but completely ambulatory  Filed Vitals:   12/05/13 1250  BP: 121/79  Pulse: 72  Temp: 98.4 F (36.9 C)  Resp: 18   Filed Weights   12/05/13 1250  Weight: 166 lb 11.2 oz (75.615 kg)    GENERAL:alert, no distress and comfortable SKIN: skin color, texture, turgor are normal, no rashes or significant lesions EYES: normal, Conjunctiva are pink and non-injected, sclera clear OROPHARYNX:no exudate, no erythema and lips, buccal  mucosa, and tongue normal  NECK: supple, thyroid normal size, non-tender, without nodularity LYMPH:  no palpable lymphadenopathy in the cervical, axillary or inguinal LUNGS: clear to auscultation and percussion with normal breathing effort HEART: regular rate & rhythm and no murmurs and no lower extremity edema ABDOMEN:abdomen soft, non-tender and normal bowel sounds Musculoskeletal:no cyanosis of digits and no clubbing  NEURO: alert & oriented x 3 with fluent speech, no focal motor/sensory deficits  LABORATORY DATA:  I have reviewed the data as listed   Chemistry      Component Value Date/Time   NA 140 12/05/2013 1230   NA 140 10/15/2013 1308   K 4.6 12/05/2013 1230   K 4.3 10/15/2013 1308  CL 102 10/15/2013 1308   CO2 26 12/05/2013 1230   CO2 26 10/15/2013 1308   BUN 14.5 12/05/2013 1230   BUN 12 10/15/2013 1308   CREATININE 0.8 12/05/2013 1230   CREATININE 0.81 10/15/2013 1308      Component Value Date/Time   CALCIUM 10.0 12/05/2013 1230   CALCIUM 9.8 10/15/2013 1308   ALKPHOS 101 12/05/2013 1230   ALKPHOS 92 10/15/2013 1308   AST 24 12/05/2013 1230   AST 37 10/15/2013 1308   ALT 31 12/05/2013 1230   ALT 32 10/15/2013 1308   BILITOT 0.36 12/05/2013 1230   BILITOT 0.4 10/15/2013 1308       Lab Results  Component Value Date   WBC 3.5* 12/05/2013   HGB 12.7 12/05/2013   HCT 39.0 12/05/2013   MCV 90.7 12/05/2013   PLT 189 12/05/2013   NEUTROABS 2.1 12/05/2013   ASSESSMENT & PLAN:  Breast cancer of lower-outer quadrant of left female breast Left breast invasive ductal carcinoma ER/PR positive HER-2 positive status post lumpectomy T2, N0, M0 stage II A., 2.4 cm Currently on adjuvant chemotherapy with Abraxane Herceptin she is here today for week 3 of treatment. She has noted that her eyes are watering as well as nose is dripping. She also noted a macular rash on her dorsum of her hands. She has noticed mild hair thinning. Otherwise she is tolerating chemotherapy extremely well. He did  not have any nausea vomiting or any cardiac symptoms. I encouraged her to take Claritin-D for the itchy eyes and runny nose.  I reviewed her blood work from today. She is due to go for treatment. We will see her back weekly for followups.  Patient had previously told me that she developed alopecia, she would not want to continue her treatment. We are watching her hair situation very closely. If she cannot continue Abraxane, then I would give her Herceptin weekly x12. No orders of the defined types were placed in this encounter.   The patient has a good understanding of the overall plan. she agrees with it. She will call with any problems that may develop before her next visit here.  I spent 20 minutes counseling the patient face to face. The total time spent in the appointment was 25 minutes and more than 50% was on counseling and review of test results    Rulon Eisenmenger, MD 12/05/2013 1:45 PM

## 2013-12-11 ENCOUNTER — Other Ambulatory Visit: Payer: Self-pay | Admitting: *Deleted

## 2013-12-11 DIAGNOSIS — C50512 Malignant neoplasm of lower-outer quadrant of left female breast: Secondary | ICD-10-CM

## 2013-12-12 ENCOUNTER — Other Ambulatory Visit: Payer: Self-pay | Admitting: Emergency Medicine

## 2013-12-12 ENCOUNTER — Other Ambulatory Visit (HOSPITAL_BASED_OUTPATIENT_CLINIC_OR_DEPARTMENT_OTHER): Payer: Commercial Managed Care - HMO

## 2013-12-12 ENCOUNTER — Other Ambulatory Visit: Payer: Self-pay | Admitting: Hematology and Oncology

## 2013-12-12 ENCOUNTER — Ambulatory Visit (HOSPITAL_BASED_OUTPATIENT_CLINIC_OR_DEPARTMENT_OTHER): Payer: Commercial Managed Care - HMO

## 2013-12-12 ENCOUNTER — Encounter: Payer: Self-pay | Admitting: Adult Health

## 2013-12-12 ENCOUNTER — Ambulatory Visit (HOSPITAL_BASED_OUTPATIENT_CLINIC_OR_DEPARTMENT_OTHER): Payer: Commercial Managed Care - HMO | Admitting: Adult Health

## 2013-12-12 VITALS — BP 130/75 | HR 79 | Temp 97.4°F | Resp 18 | Ht 64.0 in | Wt 168.5 lb

## 2013-12-12 DIAGNOSIS — C50919 Malignant neoplasm of unspecified site of unspecified female breast: Secondary | ICD-10-CM

## 2013-12-12 DIAGNOSIS — C50512 Malignant neoplasm of lower-outer quadrant of left female breast: Secondary | ICD-10-CM

## 2013-12-12 DIAGNOSIS — Z5111 Encounter for antineoplastic chemotherapy: Secondary | ICD-10-CM

## 2013-12-12 DIAGNOSIS — Z170 Estrogen receptor positive status [ER+]: Secondary | ICD-10-CM

## 2013-12-12 DIAGNOSIS — Z5112 Encounter for antineoplastic immunotherapy: Secondary | ICD-10-CM

## 2013-12-12 LAB — COMPREHENSIVE METABOLIC PANEL (CC13)
ALBUMIN: 3.2 g/dL — AB (ref 3.5–5.0)
ALT: 26 U/L (ref 0–55)
ANION GAP: 6 meq/L (ref 3–11)
AST: 23 U/L (ref 5–34)
Alkaline Phosphatase: 96 U/L (ref 40–150)
BUN: 9.1 mg/dL (ref 7.0–26.0)
CHLORIDE: 110 meq/L — AB (ref 98–109)
CO2: 26 mEq/L (ref 22–29)
CREATININE: 0.8 mg/dL (ref 0.6–1.1)
Calcium: 9.2 mg/dL (ref 8.4–10.4)
GLUCOSE: 117 mg/dL (ref 70–140)
POTASSIUM: 4 meq/L (ref 3.5–5.1)
Sodium: 142 mEq/L (ref 136–145)
Total Bilirubin: 0.3 mg/dL (ref 0.20–1.20)
Total Protein: 6.1 g/dL — ABNORMAL LOW (ref 6.4–8.3)

## 2013-12-12 LAB — CBC WITH DIFFERENTIAL/PLATELET
BASO%: 0.3 % (ref 0.0–2.0)
Basophils Absolute: 0 10*3/uL (ref 0.0–0.1)
EOS ABS: 0 10*3/uL (ref 0.0–0.5)
EOS%: 1 % (ref 0.0–7.0)
HCT: 36.3 % (ref 34.8–46.6)
HGB: 12 g/dL (ref 11.6–15.9)
LYMPH#: 1 10*3/uL (ref 0.9–3.3)
LYMPH%: 32.3 % (ref 14.0–49.7)
MCH: 29.9 pg (ref 25.1–34.0)
MCHC: 33.1 g/dL (ref 31.5–36.0)
MCV: 90.3 fL (ref 79.5–101.0)
MONO#: 0.2 10*3/uL (ref 0.1–0.9)
MONO%: 5.1 % (ref 0.0–14.0)
NEUT%: 61.3 % (ref 38.4–76.8)
NEUTROS ABS: 1.8 10*3/uL (ref 1.5–6.5)
Platelets: 162 10*3/uL (ref 145–400)
RBC: 4.02 10*6/uL (ref 3.70–5.45)
RDW: 14.3 % (ref 11.2–14.5)
WBC: 3 10*3/uL — ABNORMAL LOW (ref 3.9–10.3)

## 2013-12-12 MED ORDER — DEXAMETHASONE SODIUM PHOSPHATE 10 MG/ML IJ SOLN
INTRAMUSCULAR | Status: AC
  Filled 2013-12-12: qty 1

## 2013-12-12 MED ORDER — ONDANSETRON 8 MG/50ML IVPB (CHCC)
8.0000 mg | Freq: Once | INTRAVENOUS | Status: AC
  Administered 2013-12-12: 8 mg via INTRAVENOUS

## 2013-12-12 MED ORDER — PACLITAXEL PROTEIN-BOUND CHEMO INJECTION 100 MG
100.0000 mg/m2 | Freq: Once | INTRAVENOUS | Status: AC
  Administered 2013-12-12: 175 mg via INTRAVENOUS
  Filled 2013-12-12: qty 35

## 2013-12-12 MED ORDER — ACETAMINOPHEN 325 MG PO TABS
650.0000 mg | ORAL_TABLET | Freq: Once | ORAL | Status: AC
  Administered 2013-12-12: 650 mg via ORAL

## 2013-12-12 MED ORDER — DEXAMETHASONE SODIUM PHOSPHATE 10 MG/ML IJ SOLN
10.0000 mg | Freq: Once | INTRAMUSCULAR | Status: AC
  Administered 2013-12-12: 10 mg via INTRAVENOUS

## 2013-12-12 MED ORDER — SODIUM CHLORIDE 0.9 % IV SOLN
Freq: Once | INTRAVENOUS | Status: AC
  Administered 2013-12-12: 14:00:00 via INTRAVENOUS

## 2013-12-12 MED ORDER — DIPHENHYDRAMINE HCL 25 MG PO CAPS
50.0000 mg | ORAL_CAPSULE | Freq: Once | ORAL | Status: AC
  Administered 2013-12-12: 50 mg via ORAL

## 2013-12-12 MED ORDER — ACETAMINOPHEN 325 MG PO TABS
ORAL_TABLET | ORAL | Status: AC
  Filled 2013-12-12: qty 2

## 2013-12-12 MED ORDER — ONDANSETRON 8 MG/NS 50 ML IVPB
INTRAVENOUS | Status: AC
  Filled 2013-12-12: qty 8

## 2013-12-12 MED ORDER — TRASTUZUMAB CHEMO INJECTION 440 MG
2.0000 mg/kg | Freq: Once | INTRAVENOUS | Status: AC
  Administered 2013-12-12: 147 mg via INTRAVENOUS
  Filled 2013-12-12: qty 7

## 2013-12-12 MED ORDER — SODIUM CHLORIDE 0.9 % IJ SOLN
10.0000 mL | INTRAMUSCULAR | Status: DC | PRN
  Administered 2013-12-12: 10 mL
  Filled 2013-12-12: qty 10

## 2013-12-12 MED ORDER — HEPARIN SOD (PORK) LOCK FLUSH 100 UNIT/ML IV SOLN
500.0000 [IU] | Freq: Once | INTRAVENOUS | Status: AC | PRN
  Administered 2013-12-12: 500 [IU]
  Filled 2013-12-12: qty 5

## 2013-12-12 MED ORDER — DIPHENHYDRAMINE HCL 25 MG PO CAPS
ORAL_CAPSULE | ORAL | Status: AC
  Filled 2013-12-12: qty 2

## 2013-12-12 NOTE — Progress Notes (Signed)
Pt complains of nose bleeds. Pt wants to know if she should continue to take her xarelto.

## 2013-12-12 NOTE — Patient Instructions (Addendum)
Colleen Galloway Discharge Instructions for Patients Receiving Chemotherapy  Today you received the following chemotherapy agents: Abraxane and Herceptin  To help prevent nausea and vomiting after your treatment, we encourage you to take your nausea medication: Zofran 8 mg every every 12 hours and Compazine 10 mg every 6 hours  As needed.   If you develop nausea and vomiting that is not controlled by your nausea medication, call the clinic.   BELOW ARE SYMPTOMS THAT SHOULD BE REPORTED IMMEDIATELY:  *FEVER GREATER THAN 100.5 F  *CHILLS WITH OR WITHOUT FEVER  NAUSEA AND VOMITING THAT IS NOT CONTROLLED WITH YOUR NAUSEA MEDICATION  *UNUSUAL SHORTNESS OF BREATH  *UNUSUAL BRUISING OR BLEEDING  TENDERNESS IN MOUTH AND THROAT WITH OR WITHOUT PRESENCE OF ULCERS  *URINARY PROBLEMS  *BOWEL PROBLEMS

## 2013-12-12 NOTE — Progress Notes (Signed)
Patient Care Team: Nicoletta Dress, MD as PCP - General (Internal Medicine) Deboraha Sprang, MD as Attending Physician (Cardiology) Rulon Eisenmenger, MD as Consulting Physician (Hematology and Oncology)  DIAGNOSIS: Breast cancer of lower-outer quadrant of left female breast   Primary site: Breast (Left)   Staging method: AJCC 7th Edition   Clinical: Stage IA (T1c, N0, cM0) signed by Rulon Eisenmenger, MD on 10/04/2013  1:59 PM   Summary: Stage IA (T1c, N0, cM0)   SUMMARY OF ONCOLOGIC HISTORY:   Breast cancer of lower-outer quadrant of left female breast   09/17/2013 Initial Diagnosis Left breast: invasive ductal cancer ER 100% PR 98% HER-2 amplified ratio 2.6 with gene copy #3.25 Ki-67 is 78%   09/27/2013 Breast MRI Left breast 1 cm mass with linear non-mass enhancement consistent with biopsy-proven DCIS dispense 1.8 cm. No pathologic lymphadenopathy   10/18/2013 Surgery left breast lumpectomy invasive ductal carcinoma grade 2.4 cm with high-grade DCIS 1 sentinel node negative, ER 100% PR 98% HER-2 amplified ratio 2.6 Ki-67 78% T2, N0, M0 stage II A.   11/21/2013 -  Chemotherapy Weekly Abraxane and Herceptin x 12 weeks planned.      CHIEF COMPLIANT: Abraxane and Herceptin  INTERVAL HISTORY: Colleen Galloway is a 79 year old Caucasian with above-mentioned history of HER-2 positive breast cancer risk of Abraxane Herceptin. She did experience some constipation, followed by diarrhea since chemotherapy. She does have internal hemorrhoids.  She did have one episode of BRBPR.  She is on Xarelto due to atrial fibrillation.  She has also been having some hair loss.  She has some nose bleeding and wants to know what to do about it.  This is mainly visualized when she is blowing her nose.  She was using nasal spray and was told not to.  She went to get Claritin D from the pharmacy and was told by the pharmacist that it will make the epistaxis.  She does have some reddened skin lesions on her arms.    REVIEW OF  SYSTEMS:   A 10 point review of systems was conducted and is otherwise negative except for what is noted above.    Past Medical History  Diagnosis Date  . Atrial fibrillation   . Lumbosacral spondylosis without myelopathy   . Carotid artery disease     Followed by Dr. Scot Dock  . Insomnia, unspecified   . Esophageal reflux   . Fibrocystic disease of breast   . Osteoarthrosis, unspecified whether generalized or localized, unspecified site   . Urinary tract infection   . Arthritis   . Unspecified sinusitis (chronic)   . Orthostatic lightheadedness 10/20/2011  . Calculus of kidney     kidney stones  . Breast cancer 09/17/13    Left iNVASIVE DUCTAL,dcis  . Allergy   . PONV (postoperative nausea and vomiting)   . Dysrhythmia   . CHF (congestive heart failure)    Family History  Problem Relation Age of Onset  . Heart disease Mother   . Asthma Mother   . Hypertension Mother   . Cancer Mother   . Deep vein thrombosis Mother   . Other Mother     varicose veins  . Heart disease Father   . Hypertension Father   . Stroke Father   . Heart disease Sister   . Hypertension Sister   . Hyperlipidemia Sister   . Heart disease Brother   . Asthma Brother   . Hypertension Brother   . Deep vein thrombosis Brother   .  Diabetes Brother   . Heart disease Other   . Hypertension Other   . Colon cancer Other    Past Surgical History  Procedure Laterality Date  . Dilation and curettage of uterus  1960  . Kidney stone surgery    . Varicose veins    . Breast lumpectomy with needle localization and axillary sentinel lymph node bx Left 10/18/2013    Procedure: LEFT AXILLARY LYMPHATIC MAPPING; INJECTION OF METHYLENE BLUE INTO LEFT BREAST; LEFT BREAST PARTIAL MASTECTOMY AFTER NEEDLE LOCALIZATION; AXILLARY SENTINEL LYMPH NODE BIOPSY;  Surgeon: Jackolyn Confer, MD;  Location: [facility];  Service: General;  Laterality: Left;  . Portacath placement N/A 10/18/2013    Procedure: INSERTION PORT-A-CATH/ULTRASOUND  GUIDED,RIGHT INTERNAL JUGULAR;  Surgeon: Jackolyn Confer, MD;  Location: [facility];  Service: General;  Laterality: N/A;   History   Social History  . Marital Status: Widowed    Spouse Name: N/A    Number of Children: N/A  . Years of Education: N/A   Social History Main Topics  . Smoking status: Never Smoker   . Smokeless tobacco: Never Used  . Alcohol Use: No  . Drug Use: No  . Sexual Activity: None   Other Topics Concern  . None   Social History Narrative   Non smoker/ no tobacco use   Current work/retired   Martial status/widowed   Non drinker/ no alcohol use   Seat belt use/ always wears seatbelt   Caffeine use   Guns in the home         ALLERGIES:  is allergic to diltiazem hcl; flecainide; lopressor; nebivolol; and sodium pantothenate.  MEDICATIONS:  Current Outpatient Prescriptions  Medication Sig Dispense Refill  . acetaminophen (TYLENOL) 500 MG tablet Take 500 mg by mouth every 6 (six) hours as needed.      . calcium citrate-vitamin D (CITRACAL+D) 315-200 MG-UNIT per tablet Take 1 tablet by mouth daily.       . chlorthalidone (HYGROTON) 25 MG tablet Take 25 mg by mouth daily as needed (for fluid).       Marland Kitchen dexamethasone (DECADRON) 4 MG tablet Take 2 tablets (8 mg total) by mouth 2 (two) times daily with a meal. Start the day after chemotherapy for 2 days. Take with food.  30 tablet  1  . doxycycline (ADOXA) 50 MG tablet Take 50 mg by mouth daily as needed (for rosacea).       . lidocaine-prilocaine (EMLA) cream Apply 1 application topically as needed.  30 g  0  . LORazepam (ATIVAN) 0.5 MG tablet Take 1 tablet (0.5 mg total) by mouth every 6 (six) hours as needed (Nausea or vomiting).  30 tablet  0  . meclizine (ANTIVERT) 25 MG tablet Take 1 tablet (25 mg total) by mouth 3 (three) times daily as needed for dizziness.  90 tablet  2  . meloxicam (MOBIC) 15 MG tablet Take 15 mg by mouth daily as needed for pain.       . metoprolol succinate (TOPROL-XL) 25 MG 24 hr tablet  Take 1 tablet (25 mg total) by mouth daily.  90 tablet  1  . Multiple Vitamin (MULTIVITAMIN) capsule Take 1 capsule by mouth daily.        . Naphazoline-Pheniramine (OPCON-A) 0.027-0.315 % SOLN Place 1 drop into the left eye 2 (two) times daily as needed (for allergies).      . Omega-3 Fatty Acids (FISH OIL) 1200 MG CAPS Take 1 capsule by mouth every morning.       Marland Kitchen  Omega-3 Fatty Acids (SALMON OIL-1000) 200 MG CAPS Take 1 capsule by mouth every evening.       Marland Kitchen omeprazole (PRILOSEC) 20 MG capsule Take 20 mg by mouth daily.        . ondansetron (ZOFRAN) 8 MG tablet Take 1 tablet (8 mg total) by mouth 2 (two) times daily. Start the day after chemo for 2 days. Then take as needed for nausea or vomiting.  30 tablet  1  . prochlorperazine (COMPAZINE) 10 MG tablet Take 1 tablet (10 mg total) by mouth every 6 (six) hours as needed (Nausea or vomiting).  30 tablet  1  . Propylene Glycol (SYSTANE BALANCE) 0.6 % SOLN Apply 1 drop to eye 2 (two) times daily.      Marland Kitchen tobramycin (TOBREX) 0.3 % ophthalmic solution Place 1 drop into the left eye 4 (four) times daily.      . traZODone (DESYREL) 50 MG tablet Take 50-100 mg by mouth at bedtime.       . Vaginal Lubricant (REPLENS) GEL Place 1 Applicatorful vaginally daily as needed (for dryness).      . vitamin C (ASCORBIC ACID) 500 MG tablet Take 500 mg by mouth daily.         No current facility-administered medications for this visit.   Facility-Administered Medications Ordered in Other Visits  Medication Dose Route Frequency Provider Last Rate Last Dose  . dexamethasone (DECADRON) injection 10 mg  10 mg Intravenous Once Rulon Eisenmenger, MD      . heparin lock flush 100 unit/mL  500 Units Intracatheter Once PRN Rulon Eisenmenger, MD      . PACLitaxel-protein bound (ABRAXANE) chemo infusion 175 mg  100 mg/m2 (Treatment Plan Actual) Intravenous Once Rulon Eisenmenger, MD      . sodium chloride 0.9 % injection 10 mL  10 mL Intracatheter PRN Rulon Eisenmenger, MD      .  trastuzumab (HERCEPTIN) 147 mg in sodium chloride 0.9 % 250 mL chemo infusion  2 mg/kg (Treatment Plan Actual) Intravenous Once Rulon Eisenmenger, MD        PHYSICAL EXAMINATION: ECOG PERFORMANCE STATUS: 1 - Symptomatic but completely ambulatory  Filed Vitals:   12/12/13 1203  BP: 130/75  Pulse: 79  Temp: 97.4 F (36.3 C)  Resp: 18   Filed Weights   12/12/13 1203  Weight: 168 lb 8 oz (76.431 kg)    GENERAL: Patient is a well appearing female in no acute distress HEENT:  Sclerae anicteric.  Oropharynx clear and moist. No ulcerations or evidence of oropharyngeal candidiasis. Neck is supple.  NODES:  No cervical, supraclavicular, or axillary lymphadenopathy palpated.  BREAST EXAM: deferred LUNGS:  Clear to auscultation bilaterally.  No wheezes or rhonchi. HEART:  Regular rate and rhythm. No murmur appreciated. ABDOMEN:  Soft, nontender.  Positive, normoactive bowel sounds. No organomegaly palpated. MSK:  No focal spinal tenderness to palpation. Full range of motion bilaterally in the upper extremities. EXTREMITIES:  No peripheral edema.   SKIN:  Several 39m erythematous macules on bilateral forearms.  No nail dyscrasia. NEURO:  Nonfocal. Well oriented.  Appropriate affect.    LABORATORY DATA:  I have reviewed the data as listed   Chemistry      Component Value Date/Time   NA 142 12/12/2013 1144   NA 140 10/15/2013 1308   K 4.0 12/12/2013 1144   K 4.3 10/15/2013 1308   CL 102 10/15/2013 1308   CO2 26 12/12/2013 1144   CO2 26 10/15/2013  1308   BUN 9.1 12/12/2013 1144   BUN 12 10/15/2013 1308   CREATININE 0.8 12/12/2013 1144   CREATININE 0.81 10/15/2013 1308      Component Value Date/Time   CALCIUM 9.2 12/12/2013 1144   CALCIUM 9.8 10/15/2013 1308   ALKPHOS 96 12/12/2013 1144   ALKPHOS 92 10/15/2013 1308   AST 23 12/12/2013 1144   AST 37 10/15/2013 1308   ALT 26 12/12/2013 1144   ALT 32 10/15/2013 1308   BILITOT 0.30 12/12/2013 1144   BILITOT 0.4 10/15/2013 1308       Lab Results   Component Value Date   WBC 3.0* 12/12/2013   HGB 12.0 12/12/2013   HCT 36.3 12/12/2013   MCV 90.3 12/12/2013   PLT 162 12/12/2013   NEUTROABS 1.8 12/12/2013   ASSESSMENT & PLAN:    Colleen Galloway is a 79 year old woman with T2N0 stage IIA, invasive ductal carcinoma, triple positive.  She is receiving adjuvant chemotherapy consisting of weekly Abraxane and Herceptin. Her labs are stable.  She will proceed with chemotherapy.  Her epistaxis is likely related to the change in season and heat.  I recommended saline nasal spray BID, afrin only if necessary, and sleeping with a humidifier.  For her skin I recommended she continue to use benadryl cream and cortisone cream.  We will continue to monitor it.  She will continue with treatment and tells me that she will not discontinue early due to chemotherapy induced alopecia.    Colleen Galloway will return in one week for labs, an office visit and Abraxane/Herceptin.    The patient has a good understanding of the overall plan. she agrees with it. She will call with any problems that may develop before her next visit here.  I spent 25 minutes counseling the patient face to face. The total time spent in the appointment was 30 minutes and more than 50% was on counseling and review of test results  Minette Headland, [place] [phone]  12/12/2013 3:10 PM

## 2013-12-12 NOTE — Patient Instructions (Signed)
I recommend saline nasal spray twice a day, Afrin only if needed, and using a humidifier at night for the nose bleeds.

## 2013-12-13 ENCOUNTER — Telehealth: Payer: Self-pay | Admitting: *Deleted

## 2013-12-13 NOTE — Telephone Encounter (Signed)
Per staff message and POF I have scheduled appts. Advised scheduler of appts. JMW  

## 2013-12-19 ENCOUNTER — Other Ambulatory Visit: Payer: Self-pay | Admitting: Oncology

## 2013-12-19 ENCOUNTER — Other Ambulatory Visit (HOSPITAL_BASED_OUTPATIENT_CLINIC_OR_DEPARTMENT_OTHER): Payer: Commercial Managed Care - HMO

## 2013-12-19 ENCOUNTER — Encounter: Payer: Self-pay | Admitting: Adult Health

## 2013-12-19 ENCOUNTER — Ambulatory Visit (HOSPITAL_BASED_OUTPATIENT_CLINIC_OR_DEPARTMENT_OTHER): Payer: Commercial Managed Care - HMO | Admitting: Adult Health

## 2013-12-19 ENCOUNTER — Ambulatory Visit (HOSPITAL_BASED_OUTPATIENT_CLINIC_OR_DEPARTMENT_OTHER): Payer: Commercial Managed Care - HMO

## 2013-12-19 VITALS — BP 127/71 | HR 75 | Temp 98.3°F | Resp 18 | Ht 64.0 in | Wt 169.5 lb

## 2013-12-19 DIAGNOSIS — Z5111 Encounter for antineoplastic chemotherapy: Secondary | ICD-10-CM

## 2013-12-19 DIAGNOSIS — Z5112 Encounter for antineoplastic immunotherapy: Secondary | ICD-10-CM

## 2013-12-19 DIAGNOSIS — C50512 Malignant neoplasm of lower-outer quadrant of left female breast: Secondary | ICD-10-CM

## 2013-12-19 DIAGNOSIS — Z170 Estrogen receptor positive status [ER+]: Secondary | ICD-10-CM

## 2013-12-19 LAB — CBC WITH DIFFERENTIAL/PLATELET
BASO%: 1.1 % (ref 0.0–2.0)
Basophils Absolute: 0 10e3/uL (ref 0.0–0.1)
EOS%: 1 % (ref 0.0–7.0)
Eosinophils Absolute: 0 10e3/uL (ref 0.0–0.5)
HCT: 35.4 % (ref 34.8–46.6)
HGB: 11.6 g/dL (ref 11.6–15.9)
LYMPH%: 22.7 % (ref 14.0–49.7)
MCH: 29.6 pg (ref 25.1–34.0)
MCHC: 32.7 g/dL (ref 31.5–36.0)
MCV: 90.6 fL (ref 79.5–101.0)
MONO#: 0.2 10e3/uL (ref 0.1–0.9)
MONO%: 5.7 % (ref 0.0–14.0)
NEUT#: 2.7 10e3/uL (ref 1.5–6.5)
NEUT%: 69.5 % (ref 38.4–76.8)
Platelets: 198 10e3/uL (ref 145–400)
RBC: 3.91 10e6/uL (ref 3.70–5.45)
RDW: 14.2 % (ref 11.2–14.5)
WBC: 3.8 10e3/uL — ABNORMAL LOW (ref 3.9–10.3)
lymph#: 0.9 10e3/uL (ref 0.9–3.3)

## 2013-12-19 LAB — COMPREHENSIVE METABOLIC PANEL (CC13)
ALK PHOS: 85 U/L (ref 40–150)
ALT: 24 U/L (ref 0–55)
AST: 18 U/L (ref 5–34)
Albumin: 3.1 g/dL — ABNORMAL LOW (ref 3.5–5.0)
Anion Gap: 10 mEq/L (ref 3–11)
BUN: 12.7 mg/dL (ref 7.0–26.0)
CALCIUM: 9.2 mg/dL (ref 8.4–10.4)
CO2: 25 meq/L (ref 22–29)
Chloride: 106 mEq/L (ref 98–109)
Creatinine: 0.9 mg/dL (ref 0.6–1.1)
GLUCOSE: 122 mg/dL (ref 70–140)
POTASSIUM: 4.1 meq/L (ref 3.5–5.1)
Sodium: 140 mEq/L (ref 136–145)
TOTAL PROTEIN: 6 g/dL — AB (ref 6.4–8.3)
Total Bilirubin: 0.47 mg/dL (ref 0.20–1.20)

## 2013-12-19 LAB — TECHNOLOGIST REVIEW

## 2013-12-19 MED ORDER — HEPARIN SOD (PORK) LOCK FLUSH 100 UNIT/ML IV SOLN
500.0000 [IU] | Freq: Once | INTRAVENOUS | Status: AC | PRN
  Administered 2013-12-19: 500 [IU]
  Filled 2013-12-19: qty 5

## 2013-12-19 MED ORDER — PACLITAXEL PROTEIN-BOUND CHEMO INJECTION 100 MG
100.0000 mg/m2 | Freq: Once | INTRAVENOUS | Status: AC
  Administered 2013-12-19: 175 mg via INTRAVENOUS
  Filled 2013-12-19: qty 35

## 2013-12-19 MED ORDER — ACETAMINOPHEN 325 MG PO TABS
ORAL_TABLET | ORAL | Status: AC
  Filled 2013-12-19: qty 2

## 2013-12-19 MED ORDER — DEXAMETHASONE SODIUM PHOSPHATE 10 MG/ML IJ SOLN
INTRAMUSCULAR | Status: AC
  Filled 2013-12-19: qty 1

## 2013-12-19 MED ORDER — ONDANSETRON 8 MG/50ML IVPB (CHCC)
8.0000 mg | Freq: Once | INTRAVENOUS | Status: AC
  Administered 2013-12-19: 8 mg via INTRAVENOUS

## 2013-12-19 MED ORDER — SODIUM CHLORIDE 0.9 % IV SOLN
Freq: Once | INTRAVENOUS | Status: AC
  Administered 2013-12-19: 10:00:00 via INTRAVENOUS

## 2013-12-19 MED ORDER — DIPHENHYDRAMINE HCL 25 MG PO CAPS
25.0000 mg | ORAL_CAPSULE | Freq: Once | ORAL | Status: AC
  Administered 2013-12-19: 25 mg via ORAL

## 2013-12-19 MED ORDER — SODIUM CHLORIDE 0.9 % IJ SOLN
10.0000 mL | INTRAMUSCULAR | Status: DC | PRN
  Administered 2013-12-19: 10 mL
  Filled 2013-12-19: qty 10

## 2013-12-19 MED ORDER — DIPHENHYDRAMINE HCL 25 MG PO CAPS
ORAL_CAPSULE | ORAL | Status: AC
  Filled 2013-12-19: qty 2

## 2013-12-19 MED ORDER — TRASTUZUMAB CHEMO INJECTION 440 MG
2.0000 mg/kg | Freq: Once | INTRAVENOUS | Status: AC
  Administered 2013-12-19: 147 mg via INTRAVENOUS
  Filled 2013-12-19: qty 7

## 2013-12-19 MED ORDER — DEXAMETHASONE SODIUM PHOSPHATE 10 MG/ML IJ SOLN
10.0000 mg | Freq: Once | INTRAMUSCULAR | Status: AC
  Administered 2013-12-19: 10 mg via INTRAVENOUS

## 2013-12-19 MED ORDER — ONDANSETRON 8 MG/NS 50 ML IVPB
INTRAVENOUS | Status: AC
  Filled 2013-12-19: qty 8

## 2013-12-19 MED ORDER — ACETAMINOPHEN 325 MG PO TABS
650.0000 mg | ORAL_TABLET | Freq: Once | ORAL | Status: AC
  Administered 2013-12-19: 650 mg via ORAL

## 2013-12-19 NOTE — Progress Notes (Signed)
Patient Care Team: Nicoletta Dress, MD as PCP - General (Internal Medicine) Deboraha Sprang, MD as Attending Physician (Cardiology) Rulon Eisenmenger, MD as Consulting Physician (Hematology and Oncology)  DIAGNOSIS: Breast cancer of lower-outer quadrant of left female breast   Primary site: Breast (Left)   Staging method: AJCC 7th Edition   Clinical: Stage IA (T1c, N0, cM0) signed by Rulon Eisenmenger, MD on 10/04/2013  1:59 PM   Summary: Stage IA (T1c, N0, cM0)   SUMMARY OF ONCOLOGIC HISTORY:   Breast cancer of lower-outer quadrant of left female breast   09/17/2013 Initial Diagnosis Left breast: invasive ductal cancer ER 100% PR 98% HER-2 amplified ratio 2.6 with gene copy #3.25 Ki-67 is 78%   09/27/2013 Breast MRI Left breast 1 cm mass with linear non-mass enhancement consistent with biopsy-proven DCIS dispense 1.8 cm. No pathologic lymphadenopathy   10/18/2013 Surgery left breast lumpectomy invasive ductal carcinoma grade 2.4 cm with high-grade DCIS 1 sentinel node negative, ER 100% PR 98% HER-2 amplified ratio 2.6 Ki-67 78% T2, N0, M0 stage II A.   11/21/2013 -  Chemotherapy Weekly Abraxane and Herceptin x 12 weeks planned.      CHIEF COMPLIANT: adjuvant Abraxane and Herceptin  INTERVAL HISTORY: Colleen Galloway is a 79 year old Caucasian with above-mentioned history of HER-2 positive breast cancer here for Abraxane Herceptin. She is doing well today.  She continues to have circular erythematous skin lesions on her arms bilaterally and upper chest.  She has one lesion on her left cheek.  These lesions continue to not itch, however she does continue to apply cortisone cream.  She did have one episode of nose bleeding last night when she blew her nose.  She saw Dr. Zella Richer yesterday.  She does feel tired and her hair does continue to thin.  She denies fevers, chills, nausea, vomiting, constipation, diarrhea, numbness/tingling or any further concerns.    REVIEW OF SYSTEMS:   A 10 point review of  systems was conducted and is otherwise negative except for what is noted above.    Past Medical History  Diagnosis Date  . Atrial fibrillation   . Lumbosacral spondylosis without myelopathy   . Carotid artery disease     Followed by Dr. Scot Dock  . Insomnia, unspecified   . Esophageal reflux   . Fibrocystic disease of breast   . Osteoarthrosis, unspecified whether generalized or localized, unspecified site   . Urinary tract infection   . Arthritis   . Unspecified sinusitis (chronic)   . Orthostatic lightheadedness 10/20/2011  . Calculus of kidney     kidney stones  . Breast cancer 09/17/13    Left iNVASIVE DUCTAL,dcis  . Allergy   . PONV (postoperative nausea and vomiting)   . Dysrhythmia   . CHF (congestive heart failure)    Family History  Problem Relation Age of Onset  . Heart disease Mother   . Asthma Mother   . Hypertension Mother   . Cancer Mother   . Deep vein thrombosis Mother   . Other Mother     varicose veins  . Heart disease Father   . Hypertension Father   . Stroke Father   . Heart disease Sister   . Hypertension Sister   . Hyperlipidemia Sister   . Heart disease Brother   . Asthma Brother   . Hypertension Brother   . Deep vein thrombosis Brother   . Diabetes Brother   . Heart disease Other   . Hypertension Other   .  Colon cancer Other    Past Surgical History  Procedure Laterality Date  . Dilation and curettage of uterus  1960  . Kidney stone surgery    . Varicose veins    . Breast lumpectomy with needle localization and axillary sentinel lymph node bx Left 10/18/2013    Procedure: LEFT AXILLARY LYMPHATIC MAPPING; INJECTION OF METHYLENE BLUE INTO LEFT BREAST; LEFT BREAST PARTIAL MASTECTOMY AFTER NEEDLE LOCALIZATION; AXILLARY SENTINEL LYMPH NODE BIOPSY;  Surgeon: Jackolyn Confer, MD;  Location: [facility];  Service: General;  Laterality: Left;  . Portacath placement N/A 10/18/2013    Procedure: INSERTION PORT-A-CATH/ULTRASOUND GUIDED,RIGHT INTERNAL JUGULAR;   Surgeon: Jackolyn Confer, MD;  Location: [facility];  Service: General;  Laterality: N/A;   History   Social History  . Marital Status: Widowed    Spouse Name: N/A    Number of Children: N/A  . Years of Education: N/A   Social History Main Topics  . Smoking status: Never Smoker   . Smokeless tobacco: Never Used  . Alcohol Use: No  . Drug Use: No  . Sexual Activity: None   Other Topics Concern  . None   Social History Narrative   Non smoker/ no tobacco use   Current work/retired   Martial status/widowed   Non drinker/ no alcohol use   Seat belt use/ always wears seatbelt   Caffeine use   Guns in the home         ALLERGIES:  is allergic to diltiazem hcl; flecainide; lopressor; nebivolol; and sodium pantothenate.  MEDICATIONS:  Current Outpatient Prescriptions  Medication Sig Dispense Refill  . acetaminophen (TYLENOL) 500 MG tablet Take 500 mg by mouth every 6 (six) hours as needed.    . calcium citrate-vitamin D (CITRACAL+D) 315-200 MG-UNIT per tablet Take 1 tablet by mouth daily.     . chlorthalidone (HYGROTON) 25 MG tablet Take 25 mg by mouth daily as needed (for fluid).     Marland Kitchen dexamethasone (DECADRON) 4 MG tablet Take 2 tablets (8 mg total) by mouth 2 (two) times daily with a meal. Start the day after chemotherapy for 2 days. Take with food. 30 tablet 1  . doxycycline (ADOXA) 50 MG tablet Take 50 mg by mouth daily as needed (for rosacea).     . lidocaine-prilocaine (EMLA) cream Apply 1 application topically as needed. 30 g 0  . LORazepam (ATIVAN) 0.5 MG tablet Take 1 tablet (0.5 mg total) by mouth every 6 (six) hours as needed (Nausea or vomiting). 30 tablet 0  . meclizine (ANTIVERT) 25 MG tablet Take 1 tablet (25 mg total) by mouth 3 (three) times daily as needed for dizziness. 90 tablet 2  . meloxicam (MOBIC) 15 MG tablet Take 15 mg by mouth daily as needed for pain.     . metoprolol succinate (TOPROL-XL) 100 MG 24 hr tablet     . metoprolol succinate (TOPROL-XL) 25 MG 24  hr tablet Take 1 tablet (25 mg total) by mouth daily. 90 tablet 1  . Multiple Vitamin (MULTIVITAMIN) capsule Take 1 capsule by mouth daily.      . Naphazoline-Pheniramine (OPCON-A) 0.027-0.315 % SOLN Place 1 drop into the left eye 2 (two) times daily as needed (for allergies).    . Omega-3 Fatty Acids (FISH OIL) 1200 MG CAPS Take 1 capsule by mouth every morning.     . Omega-3 Fatty Acids (SALMON OIL-1000) 200 MG CAPS Take 1 capsule by mouth every evening.     Marland Kitchen omeprazole (PRILOSEC) 20 MG capsule Take 20  mg by mouth daily.      . ondansetron (ZOFRAN) 8 MG tablet Take 1 tablet (8 mg total) by mouth 2 (two) times daily. Start the day after chemo for 2 days. Then take as needed for nausea or vomiting. 30 tablet 1  . prochlorperazine (COMPAZINE) 10 MG tablet Take 1 tablet (10 mg total) by mouth every 6 (six) hours as needed (Nausea or vomiting). 30 tablet 1  . Propylene Glycol (SYSTANE BALANCE) 0.6 % SOLN Apply 1 drop to eye 2 (two) times daily.    Marland Kitchen tobramycin (TOBREX) 0.3 % ophthalmic solution Place 1 drop into the left eye 4 (four) times daily.    . traZODone (DESYREL) 50 MG tablet Take 50-100 mg by mouth at bedtime.     . Vaginal Lubricant (REPLENS) GEL Place 1 Applicatorful vaginally daily as needed (for dryness).    . vitamin C (ASCORBIC ACID) 500 MG tablet Take 500 mg by mouth daily.       No current facility-administered medications for this visit.   Facility-Administered Medications Ordered in Other Visits  Medication Dose Route Frequency Provider Last Rate Last Dose  . sodium chloride 0.9 % injection 10 mL  10 mL Intracatheter PRN Chauncey Cruel, MD   10 mL at 12/19/13 1202    PHYSICAL EXAMINATION: ECOG PERFORMANCE STATUS: 1 - Symptomatic but completely ambulatory  Filed Vitals:   12/19/13 0830  BP: 127/71  Pulse: 75  Temp: 98.3 F (36.8 C)  Resp: 18   Filed Weights   12/19/13 0830  Weight: 169 lb 8 oz (76.885 kg)    GENERAL: Patient is a well appearing female in no  acute distress HEENT:  Sclerae anicteric.  Oropharynx clear and moist. No ulcerations or evidence of oropharyngeal candidiasis. Neck is supple.  NODES:  No cervical, supraclavicular, or axillary lymphadenopathy palpated.  BREAST EXAM: deferred LUNGS:  Clear to auscultation bilaterally.  No wheezes or rhonchi. HEART:  Regular rate and rhythm. No murmur appreciated. ABDOMEN:  Soft, nontender.  Positive, normoactive bowel sounds. No organomegaly palpated. MSK:  No focal spinal tenderness to palpation. Full range of motion bilaterally in the upper extremities. EXTREMITIES:  No peripheral edema.   SKIN:  Several 61m erythematous macules on bilateral forearms, on left cheek and now 3 on her chest.  This is slightly worse than last week.  No nail dyscrasia. NEURO:  Nonfocal. Well oriented.  Appropriate affect.    LABORATORY DATA:  I have reviewed the data as listed   Chemistry      Component Value Date/Time   NA 140 12/19/2013 0808   NA 140 10/15/2013 1308   K 4.1 12/19/2013 0808   K 4.3 10/15/2013 1308   CL 102 10/15/2013 1308   CO2 25 12/19/2013 0808   CO2 26 10/15/2013 1308   BUN 12.7 12/19/2013 0808   BUN 12 10/15/2013 1308   CREATININE 0.9 12/19/2013 0808   CREATININE 0.81 10/15/2013 1308      Component Value Date/Time   CALCIUM 9.2 12/19/2013 0808   CALCIUM 9.8 10/15/2013 1308   ALKPHOS 85 12/19/2013 0808   ALKPHOS 92 10/15/2013 1308   AST 18 12/19/2013 0808   AST 37 10/15/2013 1308   ALT 24 12/19/2013 0808   ALT 32 10/15/2013 1308   BILITOT 0.47 12/19/2013 0808   BILITOT 0.4 10/15/2013 1308       Lab Results  Component Value Date   WBC 3.8* 12/19/2013   HGB 11.6 12/19/2013   HCT 35.4 12/19/2013  MCV 90.6 12/19/2013   PLT 198 12/19/2013   NEUTROABS 2.7 12/19/2013   ASSESSMENT & PLAN:    Sierria Bruney is a 79 year old woman with T2N0 stage IIA, invasive ductal carcinoma, triple positive.  She is receiving adjuvant chemotherapy consisting of weekly Abraxane and  Herceptin.  She is tolerating the treatment relatively well.  I am concerned that she is developing a rash to the Abraxane chemotherapy.  We discussed this in detail, including holding Abraxane x 1 week and seeing if it improves.  She is very adamant that she will get chemotherapy today, but agrees that if the rash is worse next week that she will take that approach. She will continue saline nasal spray and a humidifier for her occasional epistaxis.  Her labs are stable, she will proceed with treatment today.    I referred her to Dr. Haroldine Laws for evaluation and monitoring to prevent/detect early herceptin related cardiotoxicity.    Terriah will return in one week for labs, an office visit and Abraxane/Herceptin.    The patient has a good understanding of the overall plan. she agrees with it. She will call with any problems that may develop before her next visit here.  I spent 25 minutes counseling the patient face to face. The total time spent in the appointment was 30 minutes and more than 50% was on counseling and review of test results  Minette Headland, [place] [phone]  12/19/2013 12:47 PM

## 2013-12-19 NOTE — Patient Instructions (Signed)
[facility] Discharge Instructions for Patients Receiving Chemotherapy  Today you received the following chemotherapy agents Herceptin/Abraxane.  To help prevent nausea and vomiting after your treatment, we encourage you to take your nausea medication as prescribed.   If you develop nausea and vomiting that is not controlled by your nausea medication, call the clinic.   BELOW ARE SYMPTOMS THAT SHOULD BE REPORTED IMMEDIATELY:  *FEVER GREATER THAN 100.5 F  *CHILLS WITH OR WITHOUT FEVER  NAUSEA AND VOMITING THAT IS NOT CONTROLLED WITH YOUR NAUSEA MEDICATION  *UNUSUAL SHORTNESS OF BREATH  *UNUSUAL BRUISING OR BLEEDING  TENDERNESS IN MOUTH AND THROAT WITH OR WITHOUT PRESENCE OF ULCERS  *URINARY PROBLEMS  *BOWEL PROBLEMS  UNUSUAL RASH Items with * indicate a potential emergency and should be followed up as soon as possible.  Feel free to call the clinic you have any questions or concerns. The clinic phone number is [phone].    

## 2013-12-20 ENCOUNTER — Telehealth: Payer: Self-pay | Admitting: Hematology and Oncology

## 2013-12-20 NOTE — Telephone Encounter (Signed)
Spoke w. Colleen Galloway at [place] office and set up appt for 02/03/14 @ 10 echo 11:40 dr visit. Pt is aware.

## 2013-12-26 ENCOUNTER — Telehealth: Payer: Self-pay | Admitting: *Deleted

## 2013-12-26 ENCOUNTER — Ambulatory Visit (HOSPITAL_BASED_OUTPATIENT_CLINIC_OR_DEPARTMENT_OTHER): Payer: Commercial Managed Care - HMO

## 2013-12-26 ENCOUNTER — Other Ambulatory Visit (HOSPITAL_BASED_OUTPATIENT_CLINIC_OR_DEPARTMENT_OTHER): Payer: Commercial Managed Care - HMO

## 2013-12-26 ENCOUNTER — Ambulatory Visit (HOSPITAL_BASED_OUTPATIENT_CLINIC_OR_DEPARTMENT_OTHER): Payer: Commercial Managed Care - HMO | Admitting: Hematology and Oncology

## 2013-12-26 VITALS — BP 120/64 | HR 84 | Temp 98.0°F | Resp 18 | Ht 64.0 in | Wt 174.0 lb

## 2013-12-26 DIAGNOSIS — C50212 Malignant neoplasm of upper-inner quadrant of left female breast: Secondary | ICD-10-CM

## 2013-12-26 DIAGNOSIS — L659 Nonscarring hair loss, unspecified: Secondary | ICD-10-CM

## 2013-12-26 DIAGNOSIS — R21 Rash and other nonspecific skin eruption: Secondary | ICD-10-CM

## 2013-12-26 DIAGNOSIS — C50512 Malignant neoplasm of lower-outer quadrant of left female breast: Secondary | ICD-10-CM

## 2013-12-26 DIAGNOSIS — Z5111 Encounter for antineoplastic chemotherapy: Secondary | ICD-10-CM

## 2013-12-26 DIAGNOSIS — Z5112 Encounter for antineoplastic immunotherapy: Secondary | ICD-10-CM

## 2013-12-26 DIAGNOSIS — Z170 Estrogen receptor positive status [ER+]: Secondary | ICD-10-CM

## 2013-12-26 DIAGNOSIS — C50219 Malignant neoplasm of upper-inner quadrant of unspecified female breast: Secondary | ICD-10-CM

## 2013-12-26 LAB — CBC WITH DIFFERENTIAL/PLATELET
BASO%: 0.2 % (ref 0.0–2.0)
Basophils Absolute: 0 10*3/uL (ref 0.0–0.1)
EOS%: 0.7 % (ref 0.0–7.0)
Eosinophils Absolute: 0 10*3/uL (ref 0.0–0.5)
HCT: 32.8 % — ABNORMAL LOW (ref 34.8–46.6)
HGB: 10.9 g/dL — ABNORMAL LOW (ref 11.6–15.9)
LYMPH%: 18.8 % (ref 14.0–49.7)
MCH: 30.2 pg (ref 25.1–34.0)
MCHC: 33.2 g/dL (ref 31.5–36.0)
MCV: 90.9 fL (ref 79.5–101.0)
MONO#: 0.1 10*3/uL (ref 0.1–0.9)
MONO%: 3.1 % (ref 0.0–14.0)
NEUT#: 3.2 10*3/uL (ref 1.5–6.5)
NEUT%: 77.2 % — ABNORMAL HIGH (ref 38.4–76.8)
Platelets: 173 10*3/uL (ref 145–400)
RBC: 3.61 10*6/uL — AB (ref 3.70–5.45)
RDW: 15.3 % — ABNORMAL HIGH (ref 11.2–14.5)
WBC: 4.2 10*3/uL (ref 3.9–10.3)
lymph#: 0.8 10*3/uL — ABNORMAL LOW (ref 0.9–3.3)

## 2013-12-26 LAB — COMPREHENSIVE METABOLIC PANEL (CC13)
ALBUMIN: 3.1 g/dL — AB (ref 3.5–5.0)
ALT: 22 U/L (ref 0–55)
ANION GAP: 5 meq/L (ref 3–11)
AST: 20 U/L (ref 5–34)
Alkaline Phosphatase: 92 U/L (ref 40–150)
BUN: 13.3 mg/dL (ref 7.0–26.0)
CALCIUM: 9.1 mg/dL (ref 8.4–10.4)
CHLORIDE: 110 meq/L — AB (ref 98–109)
CO2: 25 meq/L (ref 22–29)
Creatinine: 0.8 mg/dL (ref 0.6–1.1)
Glucose: 136 mg/dl (ref 70–140)
POTASSIUM: 3.7 meq/L (ref 3.5–5.1)
Sodium: 141 mEq/L (ref 136–145)
Total Bilirubin: 0.32 mg/dL (ref 0.20–1.20)
Total Protein: 5.9 g/dL — ABNORMAL LOW (ref 6.4–8.3)

## 2013-12-26 MED ORDER — ONDANSETRON 8 MG/NS 50 ML IVPB
INTRAVENOUS | Status: AC
  Filled 2013-12-26: qty 8

## 2013-12-26 MED ORDER — SODIUM CHLORIDE 0.9 % IV SOLN
2.0000 mg/kg | Freq: Once | INTRAVENOUS | Status: AC
  Administered 2013-12-26: 147 mg via INTRAVENOUS
  Filled 2013-12-26: qty 7

## 2013-12-26 MED ORDER — ACETAMINOPHEN 325 MG PO TABS
650.0000 mg | ORAL_TABLET | Freq: Once | ORAL | Status: AC
  Administered 2013-12-26: 650 mg via ORAL

## 2013-12-26 MED ORDER — DIPHENHYDRAMINE HCL 50 MG/ML IJ SOLN
INTRAMUSCULAR | Status: AC
  Filled 2013-12-26: qty 1

## 2013-12-26 MED ORDER — HEPARIN SOD (PORK) LOCK FLUSH 100 UNIT/ML IV SOLN
500.0000 [IU] | Freq: Once | INTRAVENOUS | Status: AC | PRN
  Administered 2013-12-26: 500 [IU]
  Filled 2013-12-26: qty 5

## 2013-12-26 MED ORDER — DIPHENHYDRAMINE HCL 50 MG/ML IJ SOLN
12.5000 mg | Freq: Once | INTRAMUSCULAR | Status: AC
  Administered 2013-12-26: 12.5 mg via INTRAVENOUS

## 2013-12-26 MED ORDER — SODIUM CHLORIDE 0.9 % IJ SOLN
10.0000 mL | INTRAMUSCULAR | Status: DC | PRN
  Administered 2013-12-26 (×2): 10 mL
  Filled 2013-12-26: qty 10

## 2013-12-26 MED ORDER — ACETAMINOPHEN 325 MG PO TABS
ORAL_TABLET | ORAL | Status: AC
  Filled 2013-12-26: qty 2

## 2013-12-26 MED ORDER — ONDANSETRON 8 MG/50ML IVPB (CHCC)
8.0000 mg | Freq: Once | INTRAVENOUS | Status: AC
  Administered 2013-12-26: 8 mg via INTRAVENOUS

## 2013-12-26 MED ORDER — DEXAMETHASONE SODIUM PHOSPHATE 10 MG/ML IJ SOLN
10.0000 mg | Freq: Once | INTRAMUSCULAR | Status: AC
  Administered 2013-12-26: 10 mg via INTRAVENOUS

## 2013-12-26 MED ORDER — DEXAMETHASONE SODIUM PHOSPHATE 10 MG/ML IJ SOLN
INTRAMUSCULAR | Status: AC
  Filled 2013-12-26: qty 1

## 2013-12-26 MED ORDER — PACLITAXEL PROTEIN-BOUND CHEMO INJECTION 100 MG
100.0000 mg/m2 | Freq: Once | INTRAVENOUS | Status: AC
  Administered 2013-12-26: 175 mg via INTRAVENOUS
  Filled 2013-12-26: qty 35

## 2013-12-26 MED ORDER — SODIUM CHLORIDE 0.9 % IV SOLN
Freq: Once | INTRAVENOUS | Status: AC
  Administered 2013-12-26: 13:00:00 via INTRAVENOUS

## 2013-12-26 NOTE — Progress Notes (Signed)
Patient Care Team: Nicoletta Dress, MD as PCP - General (Internal Medicine) Deboraha Sprang, MD as Attending Physician (Cardiology) Rulon Eisenmenger, MD as Consulting Physician (Hematology and Oncology)  DIAGNOSIS: Breast cancer of lower-outer quadrant of left female breast   Staging form: Breast, AJCC 7th Edition     Clinical: Stage IA (T1c, N0, cM0) - Signed by Rulon Eisenmenger, MD on 10/04/2013     Pathologic: No stage assigned - Unsigned   SUMMARY OF ONCOLOGIC HISTORY:   Breast cancer of lower-outer quadrant of left female breast   09/17/2013 Initial Diagnosis Left breast: invasive ductal cancer ER 100% PR 98% HER-2 amplified ratio 2.6 with gene copy #3.25 Ki-67 is 78%   09/27/2013 Breast MRI Left breast 1 cm mass with linear non-mass enhancement consistent with biopsy-proven DCIS dispense 1.8 cm. No pathologic lymphadenopathy   10/18/2013 Surgery left breast lumpectomy invasive ductal carcinoma grade 2.4 cm with high-grade DCIS 1 sentinel node negative, ER 100% PR 98% HER-2 amplified ratio 2.6 Ki-67 78% T2, N0, M0 stage II A.   11/21/2013 -  Chemotherapy Weekly Abraxane and Herceptin x 12 weeks planned.      CHIEF COMPLIANT: week 5 of Abraxane Herceptin  INTERVAL HISTORY: Colleen Galloway is a 79 year old Caucasian with above-mentioned history of left breast HER-2 positive stage II breast cancer. She is currently on adjuvant chemotherapy with weekly Abraxane Herceptin. She is tolerating it fairly well without any major problems other than a macular rash on arms and the chest wall. She is also sad and to see hair loss. It appears that most of the hair loss is at the top of her head. With some thinning on the back as well.  REVIEW OF SYSTEMS:   Constitutional: Denies fevers, chills or abnormal weight loss Eyes: Denies blurriness of vision Ears, nose, mouth, throat, and face: Denies mucositis or sore throat Respiratory: Denies cough, dyspnea or wheezes Cardiovascular: Denies palpitation, chest  discomfort or lower extremity swelling Gastrointestinal:  Denies nausea, heartburn or change in bowel habits Skin: Denies abnormal skin rashes Lymphatics: Denies new lymphadenopathy or easy bruising Neurological:Denies numbness, tingling or new weaknesses Behavioral/Psych: Mood is stable, no new changes  Breast:  denies any pain or lumps or nodules in either breasts All other systems were reviewed with the patient and are negative.  I have reviewed the past medical history, past surgical history, social history and family history with the patient and they are unchanged from previous note.  ALLERGIES:  is allergic to diltiazem hcl; flecainide; lopressor; nebivolol; and sodium pantothenate.  MEDICATIONS:  Current Outpatient Prescriptions  Medication Sig Dispense Refill  . acetaminophen (TYLENOL) 500 MG tablet Take 500 mg by mouth every 6 (six) hours as needed.    . calcium citrate-vitamin D (CITRACAL+D) 315-200 MG-UNIT per tablet Take 1 tablet by mouth daily.     . chlorthalidone (HYGROTON) 25 MG tablet Take 25 mg by mouth daily as needed (for fluid).     Marland Kitchen dexamethasone (DECADRON) 4 MG tablet Take 2 tablets (8 mg total) by mouth 2 (two) times daily with a meal. Start the day after chemotherapy for 2 days. Take with food. 30 tablet 1  . doxycycline (ADOXA) 50 MG tablet Take 50 mg by mouth daily as needed (for rosacea).     . lidocaine-prilocaine (EMLA) cream Apply 1 application topically as needed. 30 g 0  . LORazepam (ATIVAN) 0.5 MG tablet Take 1 tablet (0.5 mg total) by mouth every 6 (six) hours as needed (Nausea or  vomiting). 30 tablet 0  . meclizine (ANTIVERT) 25 MG tablet Take 1 tablet (25 mg total) by mouth 3 (three) times daily as needed for dizziness. 90 tablet 2  . meloxicam (MOBIC) 15 MG tablet Take 15 mg by mouth daily as needed for pain.     . metoprolol succinate (TOPROL-XL) 100 MG 24 hr tablet     . metoprolol succinate (TOPROL-XL) 25 MG 24 hr tablet Take 1 tablet (25 mg total)  by mouth daily. 90 tablet 1  . Multiple Vitamin (MULTIVITAMIN) capsule Take 1 capsule by mouth daily.      . Naphazoline-Pheniramine (OPCON-A) 0.027-0.315 % SOLN Place 1 drop into the left eye 2 (two) times daily as needed (for allergies).    . Omega-3 Fatty Acids (FISH OIL) 1200 MG CAPS Take 1 capsule by mouth every morning.     . Omega-3 Fatty Acids (SALMON OIL-1000) 200 MG CAPS Take 1 capsule by mouth every evening.     Marland Kitchen omeprazole (PRILOSEC) 20 MG capsule Take 20 mg by mouth daily.      . ondansetron (ZOFRAN) 8 MG tablet Take 1 tablet (8 mg total) by mouth 2 (two) times daily. Start the day after chemo for 2 days. Then take as needed for nausea or vomiting. 30 tablet 1  . prochlorperazine (COMPAZINE) 10 MG tablet Take 1 tablet (10 mg total) by mouth every 6 (six) hours as needed (Nausea or vomiting). 30 tablet 1  . Propylene Glycol (SYSTANE BALANCE) 0.6 % SOLN Apply 1 drop to eye 2 (two) times daily.    Marland Kitchen tobramycin (TOBREX) 0.3 % ophthalmic solution Place 1 drop into the left eye 4 (four) times daily.    . traZODone (DESYREL) 50 MG tablet Take 50-100 mg by mouth at bedtime.     . Vaginal Lubricant (REPLENS) GEL Place 1 Applicatorful vaginally daily as needed (for dryness).    . vitamin C (ASCORBIC ACID) 500 MG tablet Take 500 mg by mouth daily.       No current facility-administered medications for this visit.   Facility-Administered Medications Ordered in Other Visits  Medication Dose Route Frequency Provider Last Rate Last Dose  . sodium chloride 0.9 % injection 10 mL  10 mL Intracatheter PRN Chauncey Cruel, MD   10 mL at 12/26/13 1519    PHYSICAL EXAMINATION: ECOG PERFORMANCE STATUS: 0 - Asymptomatic  Filed Vitals:   12/26/13 1134  BP: 120/64  Pulse: 84  Temp: 98 F (36.7 C)  Resp: 18   Filed Weights   12/26/13 1134  Weight: 174 lb (78.926 kg)    GENERAL:alert, no distress and comfortable SKIN: skin color, texture, turgor are normal, no rashes or significant  lesions EYES: normal, Conjunctiva are pink and non-injected, sclera clear OROPHARYNX:no exudate, no erythema and lips, buccal mucosa, and tongue normal  NECK: supple, thyroid normal size, non-tender, without nodularity LYMPH:  no palpable lymphadenopathy in the cervical, axillary or inguinal LUNGS: clear to auscultation and percussion with normal breathing effort HEART: regular rate & rhythm and no murmurs and no lower extremity edema ABDOMEN:abdomen soft, non-tender and normal bowel sounds Musculoskeletal:no cyanosis of digits and no clubbing  NEURO: alert & oriented x 3 with fluent speech, no focal motor/sensory deficits  LABORATORY DATA:  I have reviewed the data as listed   Chemistry      Component Value Date/Time   NA 141 12/26/2013 1058   NA 140 10/15/2013 1308   K 3.7 12/26/2013 1058   K 4.3 10/15/2013 1308  CL 102 10/15/2013 1308   CO2 25 12/26/2013 1058   CO2 26 10/15/2013 1308   BUN 13.3 12/26/2013 1058   BUN 12 10/15/2013 1308   CREATININE 0.8 12/26/2013 1058   CREATININE 0.81 10/15/2013 1308      Component Value Date/Time   CALCIUM 9.1 12/26/2013 1058   CALCIUM 9.8 10/15/2013 1308   ALKPHOS 92 12/26/2013 1058   ALKPHOS 92 10/15/2013 1308   AST 20 12/26/2013 1058   AST 37 10/15/2013 1308   ALT 22 12/26/2013 1058   ALT 32 10/15/2013 1308   BILITOT 0.32 12/26/2013 1058   BILITOT 0.4 10/15/2013 1308       Lab Results  Component Value Date   WBC 4.2 12/26/2013   HGB 10.9* 12/26/2013   HCT 32.8* 12/26/2013   MCV 90.9 12/26/2013   PLT 173 12/26/2013   NEUTROABS 3.2 12/26/2013    ASSESSMENT & PLAN:  Breast cancer of lower-outer quadrant of left female breast Left breast invasive ductal carcinoma ER/PR positive HER-2 positive status post lumpectomy T2, N0, M0 stage II A., 2.4 cm Chemotherapy toxicities: Currently on Abraxane Herceptin today is week 5 of treatment. The following are the toxicities 1. Rash on arms and chest macular in nature with itching 2.  Hair loss  I discussed with her that these symptoms can be managed with Benadryl. Patient is really sad about losing her hair but plans to get away. Otherwise her blood counts are adequate to continue with the current chemotherapy plan. Monitoring closely for chemotherapy toxicities. I will see her back in 1 weeks for followup.   Orders Placed This Encounter  Procedures  . CBC with Differential    Standing Status: Future     Number of Occurrences:      Standing Expiration Date: 12/26/2014  . Comprehensive metabolic panel (Cmet) - [facility]    Standing Status: Future     Number of Occurrences:      Standing Expiration Date: 12/26/2014   The patient has a good understanding of the overall plan. she agrees with it. She will call with any problems that may develop before her next visit here.  I spent 20 minutes counseling the patient face to face. The total time spent in the appointment was 25 minutes and more than 50% was on counseling and review of test results    Rulon Eisenmenger, MD 12/26/2013 5:32 PM

## 2013-12-26 NOTE — Patient Instructions (Signed)
[facility] Discharge Instructions for Patients Receiving Chemotherapy  Today you received the following chemotherapy agents Herceptin/Abraxane.  To help prevent nausea and vomiting after your treatment, we encourage you to take your nausea medication as prescribed.   If you develop nausea and vomiting that is not controlled by your nausea medication, call the clinic.   BELOW ARE SYMPTOMS THAT SHOULD BE REPORTED IMMEDIATELY:  *FEVER GREATER THAN 100.5 F  *CHILLS WITH OR WITHOUT FEVER  NAUSEA AND VOMITING THAT IS NOT CONTROLLED WITH YOUR NAUSEA MEDICATION  *UNUSUAL SHORTNESS OF BREATH  *UNUSUAL BRUISING OR BLEEDING  TENDERNESS IN MOUTH AND THROAT WITH OR WITHOUT PRESENCE OF ULCERS  *URINARY PROBLEMS  *BOWEL PROBLEMS  UNUSUAL RASH Items with * indicate a potential emergency and should be followed up as soon as possible.  Feel free to call the clinic you have any questions or concerns. The clinic phone number is [phone].    

## 2013-12-26 NOTE — Assessment & Plan Note (Addendum)
Left breast invasive ductal carcinoma ER/PR positive HER-2 positive status post lumpectomy T2, N0, M0 stage II A., 2.4 cm Chemotherapy toxicities: Currently on Abraxane Herceptin today is week 5 of treatment. The following are the toxicities 1. Rash on arms and chest macular in nature with itching 2. Hair loss  I discussed with her that these symptoms can be managed with Benadryl. Patient is really sad about losing her hair but plans to get away. Otherwise her blood counts are adequate to continue with the current chemotherapy plan. Monitoring closely for chemotherapy toxicities. I will see her back in 1 weeks for followup. 

## 2013-12-26 NOTE — Telephone Encounter (Signed)
Per staff message I have adjsuted 12/10 appt

## 2014-01-01 ENCOUNTER — Telehealth: Payer: Self-pay | Admitting: Hematology and Oncology

## 2014-01-01 ENCOUNTER — Telehealth: Payer: Self-pay | Admitting: *Deleted

## 2014-01-01 NOTE — Telephone Encounter (Signed)
Received lab results from [facility] to scan.

## 2014-01-01 NOTE — Telephone Encounter (Signed)
, °

## 2014-01-02 ENCOUNTER — Other Ambulatory Visit (HOSPITAL_BASED_OUTPATIENT_CLINIC_OR_DEPARTMENT_OTHER): Payer: Commercial Managed Care - HMO

## 2014-01-02 ENCOUNTER — Ambulatory Visit (HOSPITAL_BASED_OUTPATIENT_CLINIC_OR_DEPARTMENT_OTHER): Payer: Commercial Managed Care - HMO

## 2014-01-02 ENCOUNTER — Ambulatory Visit (HOSPITAL_BASED_OUTPATIENT_CLINIC_OR_DEPARTMENT_OTHER): Payer: Commercial Managed Care - HMO | Admitting: Hematology and Oncology

## 2014-01-02 ENCOUNTER — Other Ambulatory Visit: Payer: Self-pay | Admitting: Hematology and Oncology

## 2014-01-02 VITALS — BP 138/7 | HR 86 | Temp 98.1°F | Resp 18 | Ht 64.0 in | Wt 165.6 lb

## 2014-01-02 DIAGNOSIS — C50212 Malignant neoplasm of upper-inner quadrant of left female breast: Secondary | ICD-10-CM

## 2014-01-02 DIAGNOSIS — Z5111 Encounter for antineoplastic chemotherapy: Secondary | ICD-10-CM

## 2014-01-02 DIAGNOSIS — C50219 Malignant neoplasm of upper-inner quadrant of unspecified female breast: Secondary | ICD-10-CM

## 2014-01-02 DIAGNOSIS — R21 Rash and other nonspecific skin eruption: Secondary | ICD-10-CM

## 2014-01-02 DIAGNOSIS — Z5112 Encounter for antineoplastic immunotherapy: Secondary | ICD-10-CM

## 2014-01-02 DIAGNOSIS — C50512 Malignant neoplasm of lower-outer quadrant of left female breast: Secondary | ICD-10-CM

## 2014-01-02 DIAGNOSIS — R5383 Other fatigue: Secondary | ICD-10-CM

## 2014-01-02 DIAGNOSIS — L659 Nonscarring hair loss, unspecified: Secondary | ICD-10-CM

## 2014-01-02 LAB — COMPREHENSIVE METABOLIC PANEL (CC13)
ALBUMIN: 3.3 g/dL — AB (ref 3.5–5.0)
ALT: 24 U/L (ref 0–55)
ANION GAP: 9 meq/L (ref 3–11)
AST: 18 U/L (ref 5–34)
Alkaline Phosphatase: 94 U/L (ref 40–150)
BUN: 16.4 mg/dL (ref 7.0–26.0)
CALCIUM: 9.6 mg/dL (ref 8.4–10.4)
CHLORIDE: 103 meq/L (ref 98–109)
CO2: 26 meq/L (ref 22–29)
Creatinine: 0.9 mg/dL (ref 0.6–1.1)
GLUCOSE: 168 mg/dL — AB (ref 70–140)
POTASSIUM: 3.4 meq/L — AB (ref 3.5–5.1)
Sodium: 138 mEq/L (ref 136–145)
Total Bilirubin: 0.43 mg/dL (ref 0.20–1.20)
Total Protein: 6.3 g/dL — ABNORMAL LOW (ref 6.4–8.3)

## 2014-01-02 LAB — TECHNOLOGIST REVIEW

## 2014-01-02 LAB — CBC WITH DIFFERENTIAL/PLATELET
BASO%: 0.2 % (ref 0.0–2.0)
Basophils Absolute: 0 10*3/uL (ref 0.0–0.1)
EOS%: 0.7 % (ref 0.0–7.0)
Eosinophils Absolute: 0 10*3/uL (ref 0.0–0.5)
HCT: 34.3 % — ABNORMAL LOW (ref 34.8–46.6)
HEMOGLOBIN: 11.5 g/dL — AB (ref 11.6–15.9)
LYMPH#: 0.8 10*3/uL — AB (ref 0.9–3.3)
LYMPH%: 18.8 % (ref 14.0–49.7)
MCH: 30.1 pg (ref 25.1–34.0)
MCHC: 33.5 g/dL (ref 31.5–36.0)
MCV: 89.8 fL (ref 79.5–101.0)
MONO#: 0.2 10*3/uL (ref 0.1–0.9)
MONO%: 5.2 % (ref 0.0–14.0)
NEUT#: 3.3 10*3/uL (ref 1.5–6.5)
NEUT%: 75.1 % (ref 38.4–76.8)
Platelets: 176 10*3/uL (ref 145–400)
RBC: 3.82 10*6/uL (ref 3.70–5.45)
RDW: 15.9 % — AB (ref 11.2–14.5)
WBC: 4.4 10*3/uL (ref 3.9–10.3)

## 2014-01-02 MED ORDER — DIPHENHYDRAMINE HCL 50 MG/ML IJ SOLN: 12.5000 mg | Freq: Once | INTRAMUSCULAR | Status: DC

## 2014-01-02 MED ORDER — ONDANSETRON 8 MG/50ML IVPB (CHCC)
8.0000 mg | Freq: Once | INTRAVENOUS | Status: AC
  Administered 2014-01-02: 8 mg via INTRAVENOUS

## 2014-01-02 MED ORDER — HEPARIN SOD (PORK) LOCK FLUSH 100 UNIT/ML IV SOLN
500.0000 [IU] | Freq: Once | INTRAVENOUS | Status: AC | PRN
  Administered 2014-01-02: 500 [IU]
  Filled 2014-01-02: qty 5

## 2014-01-02 MED ORDER — TRASTUZUMAB CHEMO INJECTION 440 MG
2.0000 mg/kg | Freq: Once | INTRAVENOUS | Status: AC
  Administered 2014-01-02: 147 mg via INTRAVENOUS
  Filled 2014-01-02: qty 7

## 2014-01-02 MED ORDER — ACETAMINOPHEN 325 MG PO TABS
ORAL_TABLET | ORAL | Status: AC
  Filled 2014-01-02: qty 2

## 2014-01-02 MED ORDER — ONDANSETRON 8 MG/NS 50 ML IVPB
INTRAVENOUS | Status: AC
  Filled 2014-01-02: qty 8

## 2014-01-02 MED ORDER — PACLITAXEL PROTEIN-BOUND CHEMO INJECTION 100 MG
100.0000 mg/m2 | Freq: Once | INTRAVENOUS | Status: AC
  Administered 2014-01-02: 175 mg via INTRAVENOUS
  Filled 2014-01-02: qty 35

## 2014-01-02 MED ORDER — DEXAMETHASONE SODIUM PHOSPHATE 10 MG/ML IJ SOLN
10.0000 mg | Freq: Once | INTRAMUSCULAR | Status: AC
  Administered 2014-01-02: 10 mg via INTRAVENOUS

## 2014-01-02 MED ORDER — SODIUM CHLORIDE 0.9 % IV SOLN
Freq: Once | INTRAVENOUS | Status: AC
  Administered 2014-01-02: 12:00:00 via INTRAVENOUS

## 2014-01-02 MED ORDER — SODIUM CHLORIDE 0.9 % IJ SOLN
10.0000 mL | INTRAMUSCULAR | Status: DC | PRN
  Administered 2014-01-02: 10 mL
  Filled 2014-01-02: qty 10

## 2014-01-02 MED ORDER — ACETAMINOPHEN 325 MG PO TABS
650.0000 mg | ORAL_TABLET | Freq: Once | ORAL | Status: AC
  Administered 2014-01-02: 650 mg via ORAL

## 2014-01-02 MED ORDER — DEXAMETHASONE SODIUM PHOSPHATE 10 MG/ML IJ SOLN
INTRAMUSCULAR | Status: AC
  Filled 2014-01-02: qty 1

## 2014-01-02 MED ORDER — DIPHENHYDRAMINE HCL 25 MG PO CAPS
ORAL_CAPSULE | ORAL | Status: AC
  Filled 2014-01-02: qty 2

## 2014-01-02 MED ORDER — DIPHENHYDRAMINE HCL 25 MG PO CAPS
50.0000 mg | ORAL_CAPSULE | Freq: Once | ORAL | Status: AC
  Administered 2014-01-02: 50 mg via ORAL

## 2014-01-02 NOTE — Progress Notes (Signed)
Patient Care Team: Nicoletta Dress, MD as PCP - General (Internal Medicine) Deboraha Sprang, MD as Attending Physician (Cardiology) Rulon Eisenmenger, MD as Consulting Physician (Hematology and Oncology)  DIAGNOSIS: Breast cancer of lower-outer quadrant of left female breast   Staging form: Breast, AJCC 7th Edition     Clinical: Stage IA (T1c, N0, cM0) - Signed by Rulon Eisenmenger, MD on 10/04/2013     Pathologic: No stage assigned - Unsigned   SUMMARY OF ONCOLOGIC HISTORY:   Breast cancer of lower-outer quadrant of left female breast   09/17/2013 Initial Diagnosis Left breast: invasive ductal cancer ER 100% PR 98% HER-2 amplified ratio 2.6 with gene copy #3.25 Ki-67 is 78%   09/27/2013 Breast MRI Left breast 1 cm mass with linear non-mass enhancement consistent with biopsy-proven DCIS dispense 1.8 cm. No pathologic lymphadenopathy   10/18/2013 Surgery left breast lumpectomy invasive ductal carcinoma grade 2.4 cm with high-grade DCIS 1 sentinel node negative, ER 100% PR 98% HER-2 amplified ratio 2.6 Ki-67 78% T2, N0, M0 stage II A.   11/21/2013 -  Chemotherapy Weekly Abraxane and Herceptin x 12 weeks planned.      CHIEF COMPLIANT: week 6 of Abraxane Herceptin  INTERVAL HISTORY: Colleen Galloway is a 79 year old Caucasian with above-mentioned history of left-sided breast cancer underwent lumpectomy followed by adjuvant treatment with Abraxane Herceptin. Today is week 6. Overall she tolerated chemotherapy except for hair loss. Since her last chemotherapy, she had muscle aches and pains in the buttocks. But the restlessness in the legs has improved. She had a dental appointment and that the dental impressions and that led to gum irritation. Occasional chills but no fevers  REVIEW OF SYSTEMS:   Constitutional: Denies fevers, chills or abnormal weight loss Eyes: Denies blurriness of vision Ears, nose, mouth, throat, and face: Denies mucositis or sore throat Respiratory: Denies cough, dyspnea or  wheezes Cardiovascular: Denies palpitation, chest discomfort or lower extremity swelling Gastrointestinal:  Denies nausea, heartburn or change in bowel habits Skin: Rash on hands legs and chest Lymphatics: Denies new lymphadenopathy or easy bruising Neurological:Denies numbness, tingling or new weaknesses Behavioral/Psych: Mood is stable, no new changes  All other systems were reviewed with the patient and are negative.  I have reviewed the past medical history, past surgical history, social history and family history with the patient and they are unchanged from previous note.  ALLERGIES:  is allergic to diltiazem hcl; flecainide; lopressor; nebivolol; and sodium pantothenate.  MEDICATIONS:  Current Outpatient Prescriptions  Medication Sig Dispense Refill  . acetaminophen (TYLENOL) 500 MG tablet Take 500 mg by mouth every 6 (six) hours as needed.    . calcium citrate-vitamin D (CITRACAL+D) 315-200 MG-UNIT per tablet Take 1 tablet by mouth daily.     . chlorthalidone (HYGROTON) 25 MG tablet Take 25 mg by mouth daily as needed (for fluid).     Marland Kitchen dexamethasone (DECADRON) 4 MG tablet Take 2 tablets (8 mg total) by mouth 2 (two) times daily with a meal. Start the day after chemotherapy for 2 days. Take with food. 30 tablet 1  . doxycycline (ADOXA) 50 MG tablet Take 50 mg by mouth daily as needed (for rosacea).     . lidocaine-prilocaine (EMLA) cream Apply 1 application topically as needed. 30 g 0  . LORazepam (ATIVAN) 0.5 MG tablet Take 1 tablet (0.5 mg total) by mouth every 6 (six) hours as needed (Nausea or vomiting). 30 tablet 0  . meclizine (ANTIVERT) 25 MG tablet Take 1 tablet (25  mg total) by mouth 3 (three) times daily as needed for dizziness. 90 tablet 2  . meloxicam (MOBIC) 15 MG tablet Take 15 mg by mouth daily as needed for pain.     . metoprolol succinate (TOPROL-XL) 100 MG 24 hr tablet     . metoprolol succinate (TOPROL-XL) 25 MG 24 hr tablet Take 1 tablet (25 mg total) by mouth  daily. 90 tablet 1  . Multiple Vitamin (MULTIVITAMIN) capsule Take 1 capsule by mouth daily.      . Naphazoline-Pheniramine (OPCON-A) 0.027-0.315 % SOLN Place 1 drop into the left eye 2 (two) times daily as needed (for allergies).    . Omega-3 Fatty Acids (FISH OIL) 1200 MG CAPS Take 1 capsule by mouth every morning.     . Omega-3 Fatty Acids (SALMON OIL-1000) 200 MG CAPS Take 1 capsule by mouth every evening.     Marland Kitchen omeprazole (PRILOSEC) 20 MG capsule Take 20 mg by mouth daily.      . ondansetron (ZOFRAN) 8 MG tablet Take 1 tablet (8 mg total) by mouth 2 (two) times daily. Start the day after chemo for 2 days. Then take as needed for nausea or vomiting. 30 tablet 1  . prochlorperazine (COMPAZINE) 10 MG tablet Take 1 tablet (10 mg total) by mouth every 6 (six) hours as needed (Nausea or vomiting). 30 tablet 1  . Propylene Glycol (SYSTANE BALANCE) 0.6 % SOLN Apply 1 drop to eye 2 (two) times daily.    Marland Kitchen tobramycin (TOBREX) 0.3 % ophthalmic solution Place 1 drop into the left eye 4 (four) times daily.    . traZODone (DESYREL) 50 MG tablet Take 50-100 mg by mouth at bedtime.     . Vaginal Lubricant (REPLENS) GEL Place 1 Applicatorful vaginally daily as needed (for dryness).    . vitamin C (ASCORBIC ACID) 500 MG tablet Take 500 mg by mouth daily.       No current facility-administered medications for this visit.    PHYSICAL EXAMINATION: ECOG PERFORMANCE STATUS: 1 - Symptomatic but completely ambulatory  Filed Vitals:   01/02/14 1002  BP: 138/7  Pulse: 86  Temp: 98.1 F (36.7 C)  Resp: 18   Filed Weights   01/02/14 1002  Weight: 165 lb 9.6 oz (75.116 kg)    GENERAL:alert, no distress and comfortable SKIN: rash on hands legs chest maculopapular EYES: normal, Conjunctiva are pink and non-injected, sclera clear OROPHARYNX:no exudate, no erythema and lips, buccal mucosa, and tongue normal  NECK: supple, thyroid normal size, non-tender, without nodularity LYMPH:  no palpable  lymphadenopathy in the cervical, axillary or inguinal LUNGS: clear to auscultation and percussion with normal breathing effort HEART: regular rate & rhythm and no murmurs and no lower extremity edema ABDOMEN:abdomen soft, non-tender and normal bowel sounds Musculoskeletal:no cyanosis of digits and no clubbing  NEURO: alert & oriented x 3 with fluent speech, no focal motor/sensory deficits   LABORATORY DATA:  I have reviewed the data as listed   Chemistry      Component Value Date/Time   NA 138 01/02/2014 0944   NA 140 10/15/2013 1308   K 3.4* 01/02/2014 0944   K 4.3 10/15/2013 1308   CL 102 10/15/2013 1308   CO2 26 01/02/2014 0944   CO2 26 10/15/2013 1308   BUN 16.4 01/02/2014 0944   BUN 12 10/15/2013 1308   CREATININE 0.9 01/02/2014 0944   CREATININE 0.81 10/15/2013 1308      Component Value Date/Time   CALCIUM 9.6 01/02/2014 0944  CALCIUM 9.8 10/15/2013 1308   ALKPHOS 94 01/02/2014 0944   ALKPHOS 92 10/15/2013 1308   AST 18 01/02/2014 0944   AST 37 10/15/2013 1308   ALT 24 01/02/2014 0944   ALT 32 10/15/2013 1308   BILITOT 0.43 01/02/2014 0944   BILITOT 0.4 10/15/2013 1308       Lab Results  Component Value Date   WBC 4.4 01/02/2014   HGB 11.5* 01/02/2014   HCT 34.3* 01/02/2014   MCV 89.8 01/02/2014   PLT 176 01/02/2014   NEUTROABS 3.3 01/02/2014     RADIOGRAPHIC STUDIES: I have personally reviewed the radiology reports and agreed with their findings. No results found.   ASSESSMENT & PLAN:  Breast cancer of lower-outer quadrant of left female breast Left breast invasive ductal carcinoma ER/PR positive HER-2 positive status post lumpectomy T2, N0, M0 stage II A., 2.4 cm Chemotherapy toxicities: Currently on Abraxane Herceptin today is week 5 of treatment. The following are the toxicities 1. Rash on arms and chest macular in nature with itching due to Abraxane 2. Hair loss due to Abraxane 3. Fatigue due to chemotherapy  Monitoring closely for  chemotherapy toxicities I reviewed her blood work and her counts are adequate for treatment. Return to clinic in one week to follow with Mendel Ryder   No orders of the defined types were placed in this encounter.   The patient has a good understanding of the overall plan. she agrees with it. She will call with any problems that may develop before her next visit here.     Rulon Eisenmenger, MD 01/02/2014 11:05 AM

## 2014-01-02 NOTE — Patient Instructions (Signed)
[facility] Discharge Instructions for Patients Receiving Chemotherapy  Today you received the following chemotherapy agents abraxane/herceptin  To help prevent nausea and vomiting after your treatment, we encourage you to take your nausea medication as directed   If you develop nausea and vomiting that is not controlled by your nausea medication, call the clinic.   BELOW ARE SYMPTOMS THAT SHOULD BE REPORTED IMMEDIATELY:  *FEVER GREATER THAN 100.5 F  *CHILLS WITH OR WITHOUT FEVER  NAUSEA AND VOMITING THAT IS NOT CONTROLLED WITH YOUR NAUSEA MEDICATION  *UNUSUAL SHORTNESS OF BREATH  *UNUSUAL BRUISING OR BLEEDING  TENDERNESS IN MOUTH AND THROAT WITH OR WITHOUT PRESENCE OF ULCERS  *URINARY PROBLEMS  *BOWEL PROBLEMS  UNUSUAL RASH Items with * indicate a potential emergency and should be followed up as soon as possible.  Feel free to call the clinic you have any questions or concerns. The clinic phone number is [phone].

## 2014-01-02 NOTE — Assessment & Plan Note (Signed)
Left breast invasive ductal carcinoma ER/PR positive HER-2 positive status post lumpectomy T2, N0, M0 stage II A., 2.4 cm Chemotherapy toxicities: Currently on Abraxane Herceptin today is week 5 of treatment. The following are the toxicities 1. Rash on arms and chest macular in nature with itching due to Abraxane 2. Hair loss due to Abraxane 3. Fatigue due to chemotherapy  Monitoring closely for chemotherapy toxicities I reviewed her blood work and her counts are adequate for treatment. Return to clinic in one week to follow with Mendel Ryder

## 2014-01-10 ENCOUNTER — Ambulatory Visit (HOSPITAL_BASED_OUTPATIENT_CLINIC_OR_DEPARTMENT_OTHER): Payer: Commercial Managed Care - HMO | Admitting: Adult Health

## 2014-01-10 ENCOUNTER — Telehealth: Payer: Self-pay | Admitting: *Deleted

## 2014-01-10 ENCOUNTER — Other Ambulatory Visit: Payer: Commercial Managed Care - HMO

## 2014-01-10 ENCOUNTER — Telehealth: Payer: Self-pay | Admitting: Adult Health

## 2014-01-10 ENCOUNTER — Ambulatory Visit: Payer: Commercial Managed Care - HMO

## 2014-01-10 ENCOUNTER — Ambulatory Visit (HOSPITAL_COMMUNITY)
Admission: RE | Admit: 2014-01-10 | Discharge: 2014-01-10 | Disposition: A | Discharge status: Home / Self Care | Payer: Medicare HMO | Source: Ambulatory Visit | Attending: Adult Health | Admitting: Adult Health

## 2014-01-10 ENCOUNTER — Ambulatory Visit: Payer: Commercial Managed Care - HMO | Admitting: Hematology and Oncology

## 2014-01-10 ENCOUNTER — Encounter: Payer: Self-pay | Admitting: Adult Health

## 2014-01-10 ENCOUNTER — Other Ambulatory Visit (HOSPITAL_BASED_OUTPATIENT_CLINIC_OR_DEPARTMENT_OTHER): Payer: Commercial Managed Care - HMO

## 2014-01-10 VITALS — BP 119/68 | HR 88 | Temp 98.6°F | Resp 18 | Ht 64.0 in | Wt 170.2 lb

## 2014-01-10 DIAGNOSIS — C50512 Malignant neoplasm of lower-outer quadrant of left female breast: Secondary | ICD-10-CM

## 2014-01-10 DIAGNOSIS — R0609 Other forms of dyspnea: Secondary | ICD-10-CM

## 2014-01-10 DIAGNOSIS — M545 Low back pain: Secondary | ICD-10-CM | POA: Diagnosis present

## 2014-01-10 DIAGNOSIS — M25559 Pain in unspecified hip: Secondary | ICD-10-CM | POA: Diagnosis not present

## 2014-01-10 DIAGNOSIS — C50212 Malignant neoplasm of upper-inner quadrant of left female breast: Secondary | ICD-10-CM

## 2014-01-10 DIAGNOSIS — R635 Abnormal weight gain: Secondary | ICD-10-CM

## 2014-01-10 DIAGNOSIS — M7989 Other specified soft tissue disorders: Secondary | ICD-10-CM

## 2014-01-10 DIAGNOSIS — K1231 Oral mucositis (ulcerative) due to antineoplastic therapy: Secondary | ICD-10-CM

## 2014-01-10 DIAGNOSIS — K1230 Oral mucositis (ulcerative), unspecified: Secondary | ICD-10-CM

## 2014-01-10 DIAGNOSIS — Z170 Estrogen receptor positive status [ER+]: Secondary | ICD-10-CM

## 2014-01-10 LAB — COMPREHENSIVE METABOLIC PANEL (CC13)
ALK PHOS: 82 U/L (ref 40–150)
ALT: 24 U/L (ref 0–55)
AST: 23 U/L (ref 5–34)
Albumin: 2.9 g/dL — ABNORMAL LOW (ref 3.5–5.0)
Anion Gap: 11 mEq/L (ref 3–11)
BILIRUBIN TOTAL: 0.41 mg/dL (ref 0.20–1.20)
BUN: 13.1 mg/dL (ref 7.0–26.0)
CO2: 27 mEq/L (ref 22–29)
Calcium: 9 mg/dL (ref 8.4–10.4)
Chloride: 101 mEq/L (ref 98–109)
Creatinine: 0.9 mg/dL (ref 0.6–1.1)
Glucose: 123 mg/dl (ref 70–140)
Potassium: 3.4 mEq/L — ABNORMAL LOW (ref 3.5–5.1)
Sodium: 139 mEq/L (ref 136–145)
Total Protein: 5.9 g/dL — ABNORMAL LOW (ref 6.4–8.3)

## 2014-01-10 LAB — CBC WITH DIFFERENTIAL/PLATELET
BASO%: 0.2 % (ref 0.0–2.0)
BASOS ABS: 0 10*3/uL (ref 0.0–0.1)
EOS ABS: 0.1 10*3/uL (ref 0.0–0.5)
EOS%: 1.1 % (ref 0.0–7.0)
HEMATOCRIT: 31.2 % — AB (ref 34.8–46.6)
HGB: 10.4 g/dL — ABNORMAL LOW (ref 11.6–15.9)
LYMPH%: 14.5 % (ref 14.0–49.7)
MCH: 30.3 pg (ref 25.1–34.0)
MCHC: 33.3 g/dL (ref 31.5–36.0)
MCV: 91 fL (ref 79.5–101.0)
MONO#: 0.4 10*3/uL (ref 0.1–0.9)
MONO%: 8 % (ref 0.0–14.0)
NEUT%: 76.2 % (ref 38.4–76.8)
NEUTROS ABS: 3.6 10*3/uL (ref 1.5–6.5)
PLATELETS: 161 10*3/uL (ref 145–400)
RBC: 3.43 10*6/uL — ABNORMAL LOW (ref 3.70–5.45)
RDW: 17 % — ABNORMAL HIGH (ref 11.2–14.5)
WBC: 4.8 10*3/uL (ref 3.9–10.3)
lymph#: 0.7 10*3/uL — ABNORMAL LOW (ref 0.9–3.3)

## 2014-01-10 LAB — TECHNOLOGIST REVIEW

## 2014-01-10 MED ORDER — VALACYCLOVIR HCL 500 MG PO TABS: 500.0000 mg | ORAL_TABLET | Freq: Two times a day (BID) | ORAL | Status: DC

## 2014-01-10 NOTE — Telephone Encounter (Signed)
Called to inform pt of X-rays of the right hip. Communicated with pt the test did show some degenerative changes but no abnormalities. Advised pt to rest and try to stay off feet this weekend, take Tylenol for pain as Charlestine Massed, NP has instructed her. If no changes in a week we may refer her to Orthopedist. Pt verbalized understanding. No further concerns. Message to be forwarded to [facility] and Dr. Lindi Adie.

## 2014-01-10 NOTE — Progress Notes (Signed)
Patient Care Team: Nicoletta Dress, MD as PCP - General (Internal Medicine) Deboraha Sprang, MD as Attending Physician (Cardiology) Rulon Eisenmenger, MD as Consulting Physician (Hematology and Oncology)  DIAGNOSIS: Breast cancer of lower-outer quadrant of left female breast   Staging form: Breast, AJCC 7th Edition     Clinical: Stage IA (T1c, N0, cM0) - Signed by Rulon Eisenmenger, MD on 10/04/2013     Pathologic: No stage assigned - Unsigned   SUMMARY OF ONCOLOGIC HISTORY:   Breast cancer of lower-outer quadrant of left female breast   09/17/2013 Initial Diagnosis Left breast: invasive ductal cancer ER 100% PR 98% HER-2 amplified ratio 2.6 with gene copy #3.25 Ki-67 is 78%   09/27/2013 Breast MRI Left breast 1 cm mass with linear non-mass enhancement consistent with biopsy-proven DCIS dispense 1.8 cm. No pathologic lymphadenopathy   10/18/2013 Surgery left breast lumpectomy invasive ductal carcinoma grade 2.4 cm with high-grade DCIS 1 sentinel node negative, ER 100% PR 98% HER-2 amplified ratio 2.6 Ki-67 78% T2, N0, M0 stage II A.   11/21/2013 -  Chemotherapy Weekly Abraxane and Herceptin x 12 weeks planned.      CHIEF COMPLIANT: week 6 of Abraxane Herceptin  INTERVAL HISTORY: Colleen Galloway is a 79 year old Caucasian with above-mentioned history of left-sided breast cancer underwent lumpectomy followed by adjuvant treatment with Abraxane Herceptin. Today is week 7. She continues to have issues with weight gain, and lower extremity swelling.  She also has increased dyspnea on exertion that has worsened this week.  Additionally she has significant mucositis that is interfering with her ability to eat.  This has been going on for 2 weeks.  Her lower back where she fell in July is painful with walking and she is taking Meloxicam which is helping.  Her skin has reddened lesions throughout and the ones on her legs are worse, while she thinks the ones on her arms are better.  She denies any fevers, chills,  nausea, vomiting, numbness/tingling or any other concerns.    REVIEW OF SYSTEMS:   A 10 point review of systems was conducted and is otherwise negative except for what is noted above.    Past Medical History  Diagnosis Date  . Atrial fibrillation   . Lumbosacral spondylosis without myelopathy   . Carotid artery disease     Followed by Dr. Scot Dock  . Insomnia, unspecified   . Esophageal reflux   . Fibrocystic disease of breast   . Osteoarthrosis, unspecified whether generalized or localized, unspecified site   . Urinary tract infection   . Arthritis   . Unspecified sinusitis (chronic)   . Orthostatic lightheadedness 10/20/2011  . Calculus of kidney     kidney stones  . Breast cancer 09/17/13    Left iNVASIVE DUCTAL,dcis  . Allergy   . PONV (postoperative nausea and vomiting)   . Dysrhythmia   . CHF (congestive heart failure)    Past Surgical History  Procedure Laterality Date  . Dilation and curettage of uterus  1960  . Kidney stone surgery    . Varicose veins    . Breast lumpectomy with needle localization and axillary sentinel lymph node bx Left 10/18/2013    Procedure: LEFT AXILLARY LYMPHATIC MAPPING; INJECTION OF METHYLENE BLUE INTO LEFT BREAST; LEFT BREAST PARTIAL MASTECTOMY AFTER NEEDLE LOCALIZATION; AXILLARY SENTINEL LYMPH NODE BIOPSY;  Surgeon: Jackolyn Confer, MD;  Location: [place];  Service: General;  Laterality: Left;  . Portacath placement N/A 10/18/2013    Procedure: INSERTION PORT-A-CATH/ULTRASOUND GUIDED,RIGHT  INTERNAL JUGULAR;  Surgeon: Jackolyn Confer, MD;  Location: [place];  Service: General;  Laterality: N/A;   History   Social History  . Marital Status: Widowed    Spouse Name: N/A    Number of Children: N/A  . Years of Education: N/A   Social History Main Topics  . Smoking status: Never Smoker   . Smokeless tobacco: Never Used  . Alcohol Use: No  . Drug Use: No  . Sexual Activity: None   Other Topics Concern  . None   Social History Narrative   Non  smoker/ no tobacco use   Current work/retired   Martial status/widowed   Non drinker/ no alcohol use   Seat belt use/ always wears seatbelt   Caffeine use   Guns in the home         Family History  Problem Relation Age of Onset  . Heart disease Mother   . Asthma Mother   . Hypertension Mother   . Cancer Mother   . Deep vein thrombosis Mother   . Other Mother     varicose veins  . Heart disease Father   . Hypertension Father   . Stroke Father   . Heart disease Sister   . Hypertension Sister   . Hyperlipidemia Sister   . Heart disease Brother   . Asthma Brother   . Hypertension Brother   . Deep vein thrombosis Brother   . Diabetes Brother   . Heart disease Other   . Hypertension Other   . Colon cancer Other     ALLERGIES:  is allergic to diltiazem hcl; flecainide; lopressor; nebivolol; and sodium pantothenate.  MEDICATIONS:  Current Outpatient Prescriptions  Medication Sig Dispense Refill  . acetaminophen (TYLENOL) 500 MG tablet Take 500 mg by mouth every 6 (six) hours as needed.    . chlorthalidone (HYGROTON) 25 MG tablet Take 25 mg by mouth daily as needed (for fluid).     Marland Kitchen dexamethasone (DECADRON) 4 MG tablet Take 2 tablets (8 mg total) by mouth 2 (two) times daily with a meal. Start the day after chemotherapy for 2 days. Take with food. 30 tablet 1  . doxycycline (ADOXA) 50 MG tablet Take 50 mg by mouth daily as needed (for rosacea).     . lidocaine-prilocaine (EMLA) cream Apply 1 application topically as needed. 30 g 0  . LORazepam (ATIVAN) 0.5 MG tablet Take 1 tablet (0.5 mg total) by mouth every 6 (six) hours as needed (Nausea or vomiting). 30 tablet 0  . meclizine (ANTIVERT) 25 MG tablet Take 1 tablet (25 mg total) by mouth 3 (three) times daily as needed for dizziness. 90 tablet 2  . metoprolol succinate (TOPROL-XL) 25 MG 24 hr tablet Take 1 tablet (25 mg total) by mouth daily. 90 tablet 1  . Multiple Vitamin (MULTIVITAMIN) capsule Take 1 capsule by mouth  daily.      Marland Kitchen omeprazole (PRILOSEC) 20 MG capsule Take 20 mg by mouth daily.      . ondansetron (ZOFRAN) 8 MG tablet Take 1 tablet (8 mg total) by mouth 2 (two) times daily. Start the day after chemo for 2 days. Then take as needed for nausea or vomiting. 30 tablet 1  . traZODone (DESYREL) 50 MG tablet Take 50-100 mg by mouth at bedtime.     . Vaginal Lubricant (REPLENS) GEL Place 1 Applicatorful vaginally daily as needed (for dryness).    . calcium citrate-vitamin D (CITRACAL+D) 315-200 MG-UNIT per tablet Take 1 tablet by mouth  daily.     . meloxicam (MOBIC) 15 MG tablet Take 15 mg by mouth daily as needed for pain.     . Naphazoline-Pheniramine (OPCON-A) 0.027-0.315 % SOLN Place 1 drop into the left eye 2 (two) times daily as needed (for allergies).    . Omega-3 Fatty Acids (FISH OIL) 1200 MG CAPS Take 1 capsule by mouth every morning.     . Omega-3 Fatty Acids (SALMON OIL-1000) 200 MG CAPS Take 1 capsule by mouth every evening.     . prochlorperazine (COMPAZINE) 10 MG tablet Take 1 tablet (10 mg total) by mouth every 6 (six) hours as needed (Nausea or vomiting). (Patient not taking: Reported on 01/10/2014) 30 tablet 1  . Propylene Glycol (SYSTANE BALANCE) 0.6 % SOLN Apply 1 drop to eye 2 (two) times daily.    Marland Kitchen tobramycin (TOBREX) 0.3 % ophthalmic solution Place 1 drop into the left eye 4 (four) times daily.    . vitamin C (ASCORBIC ACID) 500 MG tablet Take 500 mg by mouth daily.       No current facility-administered medications for this visit.    PHYSICAL EXAMINATION: ECOG PERFORMANCE STATUS: 1 - Symptomatic but completely ambulatory  Filed Vitals:   01/10/14 1043  BP: 119/68  Pulse: 88  Temp: 98.6 F (37 C)  Resp: 18   Filed Weights   01/10/14 1043  Weight: 170 lb 3.2 oz (77.202 kg)  GENERAL: Patient is a well appearing female in no acute distress HEENT:  Sclerae anicteric.  Significant ulcerations on lower lip and erythema throughout mouth, breakdown beginning at left buccal  mucosa NODES:  No cervical, supraclavicular, or axillary lymphadenopathy palpated.  BREAST EXAM:  Deferred. LUNGS:  Clear to auscultation bilaterally.  No wheezes or rhonchi. HEART:  Regular rate and rhythm. No murmur appreciated. ABDOMEN:  Soft, nontender.  Positive, normoactive bowel sounds. No organomegaly palpated. MSK:  No focal spinal tenderness to palpation. Full range of motion bilaterally in the upper extremities. EXTREMITIES: 1+ bilateral lower extremity edema, with compression stockings on.    SKIN:  Red macular rash throughout body, mostly on arms and legs.   NEURO:  Nonfocal. Well oriented.  Appropriate affect.  LABORATORY DATA:  I have reviewed the data as listed   Chemistry      Component Value Date/Time   NA 139 01/10/2014 1021   NA 140 10/15/2013 1308   K 3.4* 01/10/2014 1021   K 4.3 10/15/2013 1308   CL 102 10/15/2013 1308   CO2 27 01/10/2014 1021   CO2 26 10/15/2013 1308   BUN 13.1 01/10/2014 1021   BUN 12 10/15/2013 1308   CREATININE 0.9 01/10/2014 1021   CREATININE 0.81 10/15/2013 1308      Component Value Date/Time   CALCIUM 9.0 01/10/2014 1021   CALCIUM 9.8 10/15/2013 1308   ALKPHOS 82 01/10/2014 1021   ALKPHOS 92 10/15/2013 1308   AST 23 01/10/2014 1021   AST 37 10/15/2013 1308   ALT 24 01/10/2014 1021   ALT 32 10/15/2013 1308   BILITOT 0.41 01/10/2014 1021   BILITOT 0.4 10/15/2013 1308       Lab Results  Component Value Date   WBC 4.8 01/10/2014   HGB 10.4* 01/10/2014   HCT 31.2* 01/10/2014   MCV 91.0 01/10/2014   PLT 161 01/10/2014   NEUTROABS 3.6 01/10/2014     RADIOGRAPHIC STUDIES: I have personally reviewed the radiology reports and agreed with their findings. No results found.   ASSESSMENT: Colleen Galloway is a 79  year old woman with T2N0 stage IIA, invasive ductal carcinoma, triple positive. She is receiving adjuvant chemotherapy consisting of weekly Abraxane and Herceptin.   PLAN:  Colleen Galloway will not receive chemotherapy today.   Her mucositis will worsen and we need to get this under control before that happens.  I ordered Magic mouthwash and valtrex BID.    Colleen Galloway's weight gain, increased dyspnea on exertion, and lower extremity swelling is also concerning.  I called and left a note with Dr. Mclean/Bensimhon's office to have f/u with echo done this upcoming week.    I ordered x rays of the lower spine and left hip for Colleen Galloway to undergo today.  She may need to be referred to [facility] for this pain.    Colleen Galloway will return on Thursday 01/17/14 for labs, evaluation and treatment.    The patient has a good understanding of the overall plan. she agrees with it. She will call with any problems that may develop before her next visit here.  I spent 40 minutes counseling the patient face to face.  The total time spent in the appointment was 50 minutes.  Minette Headland, [place] [phone] 01/10/2014 11:27 AM

## 2014-01-10 NOTE — Patient Instructions (Signed)
Valacyclovir caplets What is this medicine? VALACYCLOVIR (val ay SYE kloe veer) is an antiviral medicine. It is used to treat or prevent infections caused by certain kinds of viruses. Examples of these infections include herpes and shingles. This medicine will not cure herpes. This medicine may be used for other purposes; ask your health care provider or pharmacist if you have questions. COMMON BRAND NAME(S): Valtrex What should I tell my health care provider before I take this medicine? They need to know if you have any of these conditions: -acquired immunodeficiency syndrome (AIDS) -any other condition that may weaken the immune system -bone marrow or kidney transplant -kidney disease -an unusual or allergic reaction to valacyclovir, acyclovir, ganciclovir, valganciclovir, other medicines, foods, dyes, or preservatives -pregnant or trying to get pregnant -breast-feeding How should I use this medicine? Take this medicine by mouth with a glass of water. Follow the directions on the prescription label. You can take this medicine with or without food. Take your doses at regular intervals. Do not take your medicine more often than directed. Finish the full course prescribed by your doctor or health care professional even if you think your condition is better. Do not stop taking except on the advice of your doctor or health care professional. Talk to your pediatrician regarding the use of this medicine in children. While this drug may be prescribed for children as young as 2 years for selected conditions, precautions do apply. Overdosage: If you think you have taken too much of this medicine contact a poison control center or emergency room at once. NOTE: This medicine is only for you. Do not share this medicine with others. What if I miss a dose? If you miss a dose, take it as soon as you can. If it is almost time for your next dose, take only that dose. Do not take double or extra doses. What may  interact with this medicine? -cimetidine -probenecid This list may not describe all possible interactions. Give your health care provider a list of all the medicines, herbs, non-prescription drugs, or dietary supplements you use. Also tell them if you smoke, drink alcohol, or use illegal drugs. Some items may interact with your medicine. What should I watch for while using this medicine? Tell your doctor or health care professional if your symptoms do not start to get better after 1 week. This medicine works best when taken early in the course of an infection, within the first 59 hours. Begin treatment as soon as possible after the first signs of infection like tingling, itching, or pain in the affected area. It is possible that genital herpes may still be spread even when you are not having symptoms. Always use safer sex practices like condoms made of latex or polyurethane whenever you have sexual contact. You should stay well hydrated while taking this medicine. Drink plenty of fluids. What side effects may I notice from receiving this medicine? Side effects that you should report to your doctor or health care professional as soon as possible: -allergic reactions like skin rash, itching or hives, swelling of the face, lips, or tongue -aggressive behavior -confusion -hallucinations -problems with balance, talking, walking -stomach pain -tremor -trouble passing urine or change in the amount of urine Side effects that usually do not require medical attention (report to your doctor or health care professional if they continue or are bothersome): -dizziness -headache -nausea, vomiting This list may not describe all possible side effects. Call your doctor for medical advice about side effects. You  may report side effects to FDA at 1-800-FDA-1088. Where should I keep my medicine? Keep out of the reach of children. Store at room temperature between 15 and 25 degrees C (59 and 77 degrees F). Keep  container tightly closed. Throw away any unused medicine after the expiration date. NOTE: This sheet is a summary. It may not cover all possible information. If you have questions about this medicine, talk to your doctor, pharmacist, or health care provider.  2015, [employer]. (2012-01-17 16:34:05)

## 2014-01-10 NOTE — Telephone Encounter (Signed)
, °

## 2014-01-10 NOTE — Telephone Encounter (Signed)
-----   Message from Minette Headland, NP sent at 01/10/2014  2:25 PM EST ----- Degeneration on x ray.  If still bothering her next week without chemo may want to consider seeing ortho.   ----- Message -----    From: Rad Results In Interface    Sent: 01/10/2014  12:51 PM      To: Minette Headland, NP

## 2014-01-14 ENCOUNTER — Other Ambulatory Visit: Payer: Self-pay | Admitting: *Deleted

## 2014-01-16 ENCOUNTER — Ambulatory Visit (HOSPITAL_BASED_OUTPATIENT_CLINIC_OR_DEPARTMENT_OTHER): Payer: Commercial Managed Care - HMO | Admitting: Hematology and Oncology

## 2014-01-16 ENCOUNTER — Ambulatory Visit (HOSPITAL_BASED_OUTPATIENT_CLINIC_OR_DEPARTMENT_OTHER): Payer: Commercial Managed Care - HMO

## 2014-01-16 ENCOUNTER — Telehealth: Payer: Self-pay | Admitting: Hematology and Oncology

## 2014-01-16 ENCOUNTER — Other Ambulatory Visit (HOSPITAL_BASED_OUTPATIENT_CLINIC_OR_DEPARTMENT_OTHER): Payer: Commercial Managed Care - HMO

## 2014-01-16 VITALS — BP 121/63 | HR 65 | Temp 98.4°F | Resp 18 | Ht 64.0 in | Wt 168.3 lb

## 2014-01-16 DIAGNOSIS — C50512 Malignant neoplasm of lower-outer quadrant of left female breast: Secondary | ICD-10-CM

## 2014-01-16 DIAGNOSIS — C50219 Malignant neoplasm of upper-inner quadrant of unspecified female breast: Secondary | ICD-10-CM

## 2014-01-16 DIAGNOSIS — Z5112 Encounter for antineoplastic immunotherapy: Secondary | ICD-10-CM

## 2014-01-16 DIAGNOSIS — Z5111 Encounter for antineoplastic chemotherapy: Secondary | ICD-10-CM

## 2014-01-16 DIAGNOSIS — Z170 Estrogen receptor positive status [ER+]: Secondary | ICD-10-CM

## 2014-01-16 LAB — CBC WITH DIFFERENTIAL/PLATELET
BASO%: 0.4 % (ref 0.0–2.0)
Basophils Absolute: 0 10*3/uL (ref 0.0–0.1)
EOS ABS: 0 10*3/uL (ref 0.0–0.5)
EOS%: 0.3 % (ref 0.0–7.0)
HCT: 30.3 % — ABNORMAL LOW (ref 34.8–46.6)
HEMOGLOBIN: 10 g/dL — AB (ref 11.6–15.9)
LYMPH%: 6.6 % — AB (ref 14.0–49.7)
MCH: 29.8 pg (ref 25.1–34.0)
MCHC: 33 g/dL (ref 31.5–36.0)
MCV: 90.2 fL (ref 79.5–101.0)
MONO#: 0.6 10*3/uL (ref 0.1–0.9)
MONO%: 7.5 % (ref 0.0–14.0)
NEUT%: 85.2 % — ABNORMAL HIGH (ref 38.4–76.8)
NEUTROS ABS: 6.2 10*3/uL (ref 1.5–6.5)
PLATELETS: 198 10*3/uL (ref 145–400)
RBC: 3.36 10*6/uL — ABNORMAL LOW (ref 3.70–5.45)
RDW: 17.3 % — AB (ref 11.2–14.5)
WBC: 7.3 10*3/uL (ref 3.9–10.3)
lymph#: 0.5 10*3/uL — ABNORMAL LOW (ref 0.9–3.3)

## 2014-01-16 LAB — COMPREHENSIVE METABOLIC PANEL (CC13)
ALBUMIN: 2.8 g/dL — AB (ref 3.5–5.0)
ALT: 26 U/L (ref 0–55)
ANION GAP: 11 meq/L (ref 3–11)
AST: 24 U/L (ref 5–34)
Alkaline Phosphatase: 95 U/L (ref 40–150)
BUN: 15.9 mg/dL (ref 7.0–26.0)
CO2: 23 mEq/L (ref 22–29)
Calcium: 9.3 mg/dL (ref 8.4–10.4)
Chloride: 101 mEq/L (ref 98–109)
Creatinine: 1 mg/dL (ref 0.6–1.1)
EGFR: 58 mL/min/{1.73_m2} — AB (ref >90)
GLUCOSE: 148 mg/dL — AB (ref 70–140)
POTASSIUM: 3.7 meq/L (ref 3.5–5.1)
SODIUM: 135 meq/L — AB (ref 136–145)
TOTAL PROTEIN: 6 g/dL — AB (ref 6.4–8.3)
Total Bilirubin: 0.56 mg/dL (ref 0.20–1.20)

## 2014-01-16 MED ORDER — DIPHENHYDRAMINE HCL 25 MG PO CAPS: 50.0000 mg | ORAL_CAPSULE | Freq: Once | ORAL | Status: DC

## 2014-01-16 MED ORDER — SODIUM CHLORIDE 0.9 % IJ SOLN
10.0000 mL | INTRAMUSCULAR | Status: DC | PRN
  Administered 2014-01-16: 10 mL
  Filled 2014-01-16: qty 10

## 2014-01-16 MED ORDER — ONDANSETRON 8 MG/50ML IVPB (CHCC)
8.0000 mg | Freq: Once | INTRAVENOUS | Status: AC
  Administered 2014-01-16: 8 mg via INTRAVENOUS

## 2014-01-16 MED ORDER — DEXAMETHASONE SODIUM PHOSPHATE 10 MG/ML IJ SOLN
10.0000 mg | Freq: Once | INTRAMUSCULAR | Status: AC
  Administered 2014-01-16: 10 mg via INTRAVENOUS

## 2014-01-16 MED ORDER — ACETAMINOPHEN 325 MG PO TABS
650.0000 mg | ORAL_TABLET | Freq: Once | ORAL | Status: AC
  Administered 2014-01-16: 650 mg via ORAL

## 2014-01-16 MED ORDER — ONDANSETRON 8 MG/NS 50 ML IVPB
INTRAVENOUS | Status: AC
  Filled 2014-01-16: qty 8

## 2014-01-16 MED ORDER — TRASTUZUMAB CHEMO INJECTION 440 MG
2.0000 mg/kg | Freq: Once | INTRAVENOUS | Status: AC
  Administered 2014-01-16: 147 mg via INTRAVENOUS
  Filled 2014-01-16: qty 7

## 2014-01-16 MED ORDER — DIPHENHYDRAMINE HCL 50 MG/ML IJ SOLN
INTRAMUSCULAR | Status: AC
  Filled 2014-01-16: qty 1

## 2014-01-16 MED ORDER — ACETAMINOPHEN 325 MG PO TABS
ORAL_TABLET | ORAL | Status: AC
  Filled 2014-01-16: qty 2

## 2014-01-16 MED ORDER — HEPARIN SOD (PORK) LOCK FLUSH 100 UNIT/ML IV SOLN
500.0000 [IU] | Freq: Once | INTRAVENOUS | Status: AC | PRN
  Administered 2014-01-16: 500 [IU]
  Filled 2014-01-16: qty 5

## 2014-01-16 MED ORDER — DEXAMETHASONE SODIUM PHOSPHATE 10 MG/ML IJ SOLN
INTRAMUSCULAR | Status: AC
  Filled 2014-01-16: qty 1

## 2014-01-16 MED ORDER — SODIUM CHLORIDE 0.9 % IV SOLN
Freq: Once | INTRAVENOUS | Status: AC
  Administered 2014-01-16: 13:00:00 via INTRAVENOUS

## 2014-01-16 MED ORDER — DIPHENHYDRAMINE HCL 50 MG/ML IJ SOLN
12.5000 mg | Freq: Once | INTRAMUSCULAR | Status: AC
  Administered 2014-01-16: 12.5 mg via INTRAVENOUS

## 2014-01-16 MED ORDER — PACLITAXEL PROTEIN-BOUND CHEMO INJECTION 100 MG
85.0000 mg/m2 | Freq: Once | INTRAVENOUS | Status: AC
  Administered 2014-01-16: 150 mg via INTRAVENOUS
  Filled 2014-01-16: qty 30

## 2014-01-16 NOTE — Assessment & Plan Note (Signed)
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 Last week's chemotherapy was held because of severe mucositis as well as a right hip pain. She was given Valtrex along with Magic mouthwash which resolved her symptoms. Her right hip pain is improved with pain medication but she has currently. She reports that the taste is not fully better and she is somewhat diminished appetite. She denies any nausea vomiting or fevers or chills. 1. Maculopapular rash on the arms much improved 2. Oral mucositis: Will dose reduce chemotherapy Abraxane down to 85 mg per meter square 3. Fatigue related to chemotherapy 4. Alopecia related to chemotherapy  Return to clinic next week for toxicity check and followup as well as to receive week 9 of 12 Monitoring closely for toxicities

## 2014-01-16 NOTE — Telephone Encounter (Signed)
, °

## 2014-01-16 NOTE — Progress Notes (Signed)
Patient Care Team: Nicoletta Dress, MD as PCP - General (Internal Medicine) Deboraha Sprang, MD as Attending Physician (Cardiology) Rulon Eisenmenger, MD as Consulting Physician (Hematology and Oncology)  DIAGNOSIS: Breast cancer of lower-outer quadrant of left female breast   Staging form: Breast, AJCC 7th Edition     Clinical: Stage IA (T1c, N0, cM0) - Signed by Rulon Eisenmenger, MD on 10/04/2013     Pathologic: No stage assigned - Unsigned   SUMMARY OF ONCOLOGIC HISTORY:   Breast cancer of lower-outer quadrant of left female breast   09/17/2013 Initial Diagnosis Left breast: invasive ductal cancer ER 100% PR 98% HER-2 amplified ratio 2.6 with gene copy #3.25 Ki-67 is 78%   09/27/2013 Breast MRI Left breast 1 cm mass with linear non-mass enhancement consistent with biopsy-proven DCIS dispense 1.8 cm. No pathologic lymphadenopathy   10/18/2013 Surgery left breast lumpectomy invasive ductal carcinoma grade 2.4 cm with high-grade DCIS 1 sentinel node negative, ER 100% PR 98% HER-2 amplified ratio 2.6 Ki-67 78% T2, N0, M0 stage II A.   11/21/2013 -  Chemotherapy Weekly Abraxane and Herceptin x 12 weeks planned.      CHIEF COMPLIANT: Week 8 of 12 chemotherapy Abraxane Herceptin  INTERVAL HISTORY: Colleen Galloway is a 79 year old Caucasian with above-mentioned history of left breast HER-2 positive cancer she is currently being treated with adjuvant chemotherapy with Abraxane Herceptin. Today is week 8 of 12. She reports that last week chemotherapy was held because of severe mucositis. Her mouth sores have improved significantly with Magic mouthwash and Valtrex. She was also having right-sided hip pain which is also improved with the oral pain medications. She denies any fevers or chills. She denies any nausea vomiting. Her appetite has decreased significantly. Denies neuropathy. She had leg swelling for which he took Lasix which completely resolved her swelling.  REVIEW OF SYSTEMS:   Constitutional: Denies  fevers, chills or abnormal weight loss Eyes: Denies blurriness of vision Ears, nose, mouth, throat, and face: Denies mucositis or sore throat Respiratory: Denies cough, dyspnea or wheezes Cardiovascular: Denies palpitation, chest discomfort or lower extremity swelling Gastrointestinal:  Denies nausea, heartburn or change in bowel habits Skin: Denies abnormal skin rashes Lymphatics: Denies new lymphadenopathy or easy bruising Neurological:Denies numbness, tingling or new weaknesses Behavioral/Psych: Mood is stable, no new changes  Breast:  denies any pain or lumps or nodules in either breasts All other systems were reviewed with the patient and are negative.  I have reviewed the past medical history, past surgical history, social history and family history with the patient and they are unchanged from previous note.  ALLERGIES:  is allergic to diltiazem hcl; flecainide; lopressor; nebivolol; and sodium pantothenate.  MEDICATIONS:  Current Outpatient Prescriptions  Medication Sig Dispense Refill  . acetaminophen (TYLENOL) 500 MG tablet Take 500 mg by mouth every 6 (six) hours as needed.    . calcium citrate-vitamin D (CITRACAL+D) 315-200 MG-UNIT per tablet Take 1 tablet by mouth daily.     . chlorthalidone (HYGROTON) 25 MG tablet Take 25 mg by mouth daily as needed (for fluid).     Marland Kitchen dexamethasone (DECADRON) 4 MG tablet Take 2 tablets (8 mg total) by mouth 2 (two) times daily with a meal. Start the day after chemotherapy for 2 days. Take with food. 30 tablet 1  . doxycycline (ADOXA) 50 MG tablet Take 50 mg by mouth daily as needed (for rosacea).     . lidocaine (XYLOCAINE) 2 % solution     . lidocaine-prilocaine (  EMLA) cream Apply 1 application topically as needed. 30 g 0  . LORazepam (ATIVAN) 0.5 MG tablet Take 1 tablet (0.5 mg total) by mouth every 6 (six) hours as needed (Nausea or vomiting). 30 tablet 0  . meclizine (ANTIVERT) 25 MG tablet Take 1 tablet (25 mg total) by mouth 3 (three)  times daily as needed for dizziness. 90 tablet 2  . meloxicam (MOBIC) 15 MG tablet Take 15 mg by mouth daily as needed for pain.     . metoprolol succinate (TOPROL-XL) 25 MG 24 hr tablet Take 1 tablet (25 mg total) by mouth daily. 90 tablet 1  . Multiple Vitamin (MULTIVITAMIN) capsule Take 1 capsule by mouth daily.      . Naphazoline-Pheniramine (OPCON-A) 0.027-0.315 % SOLN Place 1 drop into the left eye 2 (two) times daily as needed (for allergies).    . Omega-3 Fatty Acids (FISH OIL) 1200 MG CAPS Take 1 capsule by mouth every morning.     . Omega-3 Fatty Acids (SALMON OIL-1000) 200 MG CAPS Take 1 capsule by mouth every evening.     Marland Kitchen omeprazole (PRILOSEC) 20 MG capsule Take 20 mg by mouth daily.      . ondansetron (ZOFRAN) 8 MG tablet Take 1 tablet (8 mg total) by mouth 2 (two) times daily. Start the day after chemo for 2 days. Then take as needed for nausea or vomiting. 30 tablet 1  . prochlorperazine (COMPAZINE) 10 MG tablet Take 1 tablet (10 mg total) by mouth every 6 (six) hours as needed (Nausea or vomiting). (Patient not taking: Reported on 01/10/2014) 30 tablet 1  . Propylene Glycol (SYSTANE BALANCE) 0.6 % SOLN Apply 1 drop to eye 2 (two) times daily.    Marland Kitchen tobramycin (TOBREX) 0.3 % ophthalmic solution Place 1 drop into the left eye 4 (four) times daily.    . traZODone (DESYREL) 50 MG tablet Take 50-100 mg by mouth at bedtime.     . Vaginal Lubricant (REPLENS) GEL Place 1 Applicatorful vaginally daily as needed (for dryness).    . valACYclovir (VALTREX) 500 MG tablet Take 1 tablet (500 mg total) by mouth 2 (two) times daily. 60 tablet 0  . vitamin C (ASCORBIC ACID) 500 MG tablet Take 500 mg by mouth daily.      Alveda Reasons 20 MG TABS tablet      No current facility-administered medications for this visit.    PHYSICAL EXAMINATION: ECOG PERFORMANCE STATUS: 1 - Symptomatic but completely ambulatory  Filed Vitals:   01/16/14 1113  BP: 121/63  Pulse: 65  Temp: 98.4 F (36.9 C)  Resp:  18   Filed Weights   01/16/14 1113  Weight: 168 lb 4.8 oz (76.34 kg)    GENERAL:alert, no distress and comfortable SKIN: skin color, texture, turgor are normal, no rashes or significant lesions EYES: normal, Conjunctiva are pink and non-injected, sclera clear OROPHARYNX:no exudate, no erythema and lips, buccal mucosa, and tongue normal  NECK: supple, thyroid normal size, non-tender, without nodularity LYMPH:  no palpable lymphadenopathy in the cervical, axillary or inguinal LUNGS: clear to auscultation and percussion with normal breathing effort HEART: irregular lower extremity edema resolved with diuretics ABDOMEN:abdomen soft, non-tender and normal bowel sounds Musculoskeletal:no cyanosis of digits and no clubbing  NEURO: alert & oriented x 3 with fluent speech, no focal motor/sensory deficits   LABORATORY DATA:  I have reviewed the data as listed   Chemistry      Component Value Date/Time   NA 135* 01/16/2014 1102  NA 140 10/15/2013 1308   K 3.7 01/16/2014 1102   K 4.3 10/15/2013 1308   CL 102 10/15/2013 1308   CO2 23 01/16/2014 1102   CO2 26 10/15/2013 1308   BUN 15.9 01/16/2014 1102   BUN 12 10/15/2013 1308   CREATININE 1.0 01/16/2014 1102   CREATININE 0.81 10/15/2013 1308      Component Value Date/Time   CALCIUM 9.3 01/16/2014 1102   CALCIUM 9.8 10/15/2013 1308   ALKPHOS 95 01/16/2014 1102   ALKPHOS 92 10/15/2013 1308   AST 24 01/16/2014 1102   AST 37 10/15/2013 1308   ALT 26 01/16/2014 1102   ALT 32 10/15/2013 1308   BILITOT 0.56 01/16/2014 1102   BILITOT 0.4 10/15/2013 1308       Lab Results  Component Value Date   WBC 7.3 01/16/2014   HGB 10.0* 01/16/2014   HCT 30.3* 01/16/2014   MCV 90.2 01/16/2014   PLT 198 01/16/2014   NEUTROABS 6.2 01/16/2014    ASSESSMENT & PLAN:  Breast cancer of lower-outer quadrant of left female breast 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 Last week's chemotherapy was held because of severe mucositis as well as a right hip pain. She was given Valtrex along with Magic mouthwash which resolved her symptoms. Her right hip pain is improved with pain medication but she has currently. She reports that the taste is not fully better and she is somewhat diminished appetite. She denies any nausea vomiting or fevers or chills. 1. Maculopapular rash on the arms much improved 2. Oral mucositis: Will dose reduce chemotherapy Abraxane down to 85 mg per meter square 3. Fatigue related to chemotherapy 4. Alopecia related to chemotherapy  Return to clinic next week for toxicity check and followup as well as to receive week 9 of 12 Monitoring closely for toxicities  Irregular heart rate: Patient is an appointment with cardiology coming up to discuss her heart issues. She has an echocardiogram coming up as well  Orders Placed This Encounter  Procedures  . CBC with Differential    Standing Status: Future     Number of Occurrences:      Standing Expiration Date: 01/16/2015  . Comprehensive metabolic panel (Cmet) - [facility]    Standing Status: Future     Number of Occurrences:      Standing Expiration Date: 01/16/2015   The patient has a good understanding of the overall plan. she agrees with it. She will call with any problems that may develop before her next visit here.   Rulon Eisenmenger, MD 01/16/2014 12:10 PM

## 2014-01-16 NOTE — Patient Instructions (Signed)
[facility] Discharge Instructions for Patients Receiving Chemotherapy  Today you received the following chemotherapy agents: Abraxane and Herceptin.  To help prevent nausea and vomiting after your treatment, we encourage you to take your nausea medication: Lorazepam, Zofran and compazine as directed.   If you develop nausea and vomiting that is not controlled by your nausea medication, call the clinic.   BELOW ARE SYMPTOMS THAT SHOULD BE REPORTED IMMEDIATELY:  *FEVER GREATER THAN 100.5 F  *CHILLS WITH OR WITHOUT FEVER  NAUSEA AND VOMITING THAT IS NOT CONTROLLED WITH YOUR NAUSEA MEDICATION  *UNUSUAL SHORTNESS OF BREATH  *UNUSUAL BRUISING OR BLEEDING  TENDERNESS IN MOUTH AND THROAT WITH OR WITHOUT PRESENCE OF ULCERS  *URINARY PROBLEMS  *BOWEL PROBLEMS  UNUSUAL RASH Items with * indicate a potential emergency and should be followed up as soon as possible.  Feel free to call the clinic you have any questions or concerns. The clinic phone number is [phone].

## 2014-01-23 ENCOUNTER — Telehealth: Payer: Self-pay | Admitting: Hematology and Oncology

## 2014-01-23 ENCOUNTER — Ambulatory Visit: Payer: Commercial Managed Care - HMO | Admitting: Adult Health

## 2014-01-23 ENCOUNTER — Other Ambulatory Visit (HOSPITAL_BASED_OUTPATIENT_CLINIC_OR_DEPARTMENT_OTHER): Payer: Commercial Managed Care - HMO

## 2014-01-23 ENCOUNTER — Other Ambulatory Visit: Payer: Self-pay | Admitting: *Deleted

## 2014-01-23 ENCOUNTER — Ambulatory Visit (HOSPITAL_BASED_OUTPATIENT_CLINIC_OR_DEPARTMENT_OTHER): Payer: Commercial Managed Care - HMO | Admitting: Hematology and Oncology

## 2014-01-23 ENCOUNTER — Other Ambulatory Visit: Payer: Commercial Managed Care - HMO

## 2014-01-23 ENCOUNTER — Other Ambulatory Visit: Payer: Self-pay | Admitting: Hematology and Oncology

## 2014-01-23 ENCOUNTER — Ambulatory Visit (HOSPITAL_BASED_OUTPATIENT_CLINIC_OR_DEPARTMENT_OTHER): Payer: Commercial Managed Care - HMO

## 2014-01-23 VITALS — BP 125/80 | HR 90 | Temp 98.3°F | Resp 18 | Ht 64.0 in | Wt 165.5 lb

## 2014-01-23 DIAGNOSIS — Z5112 Encounter for antineoplastic immunotherapy: Secondary | ICD-10-CM

## 2014-01-23 DIAGNOSIS — C50512 Malignant neoplasm of lower-outer quadrant of left female breast: Secondary | ICD-10-CM

## 2014-01-23 DIAGNOSIS — K1231 Oral mucositis (ulcerative) due to antineoplastic therapy: Secondary | ICD-10-CM

## 2014-01-23 DIAGNOSIS — R530 Neoplastic (malignant) related fatigue: Secondary | ICD-10-CM

## 2014-01-23 DIAGNOSIS — Z5111 Encounter for antineoplastic chemotherapy: Secondary | ICD-10-CM

## 2014-01-23 DIAGNOSIS — C50219 Malignant neoplasm of upper-inner quadrant of unspecified female breast: Secondary | ICD-10-CM

## 2014-01-23 DIAGNOSIS — D6481 Anemia due to antineoplastic chemotherapy: Secondary | ICD-10-CM

## 2014-01-23 LAB — COMPREHENSIVE METABOLIC PANEL (CC13)
ALT: 25 U/L (ref 0–55)
AST: 19 U/L (ref 5–34)
Albumin: 2.9 g/dL — ABNORMAL LOW (ref 3.5–5.0)
Alkaline Phosphatase: 103 U/L (ref 40–150)
Anion Gap: 13 meq/L — ABNORMAL HIGH (ref 3–11)
BUN: 16.2 mg/dL (ref 7.0–26.0)
CO2: 25 meq/L (ref 22–29)
Calcium: 9.5 mg/dL (ref 8.4–10.4)
Chloride: 98 meq/L (ref 98–109)
Creatinine: 0.9 mg/dL (ref 0.6–1.1)
EGFR: 62 ml/min/1.73 m2 — ABNORMAL LOW
Glucose: 165 mg/dL — ABNORMAL HIGH (ref 70–140)
Potassium: 3.4 meq/L — ABNORMAL LOW (ref 3.5–5.1)
Sodium: 136 meq/L (ref 136–145)
Total Bilirubin: 0.51 mg/dL (ref 0.20–1.20)
Total Protein: 6.1 g/dL — ABNORMAL LOW (ref 6.4–8.3)

## 2014-01-23 LAB — CBC WITH DIFFERENTIAL/PLATELET
BASO%: 0 % (ref 0.0–2.0)
Basophils Absolute: 0 10*3/uL (ref 0.0–0.1)
EOS ABS: 0 10*3/uL (ref 0.0–0.5)
EOS%: 0.5 % (ref 0.0–7.0)
HCT: 30.5 % — ABNORMAL LOW (ref 34.8–46.6)
HGB: 10.1 g/dL — ABNORMAL LOW (ref 11.6–15.9)
LYMPH%: 8.2 % — AB (ref 14.0–49.7)
MCH: 29.8 pg (ref 25.1–34.0)
MCHC: 33.1 g/dL (ref 31.5–36.0)
MCV: 90 fL (ref 79.5–101.0)
MONO#: 0.4 10*3/uL (ref 0.1–0.9)
MONO%: 5.9 % (ref 0.0–14.0)
NEUT#: 5.1 10*3/uL (ref 1.5–6.5)
NEUT%: 85.4 % — ABNORMAL HIGH (ref 38.4–76.8)
PLATELETS: 241 10*3/uL (ref 145–400)
RBC: 3.39 10*6/uL — AB (ref 3.70–5.45)
RDW: 17.4 % — ABNORMAL HIGH (ref 11.2–14.5)
WBC: 6 10*3/uL (ref 3.9–10.3)
lymph#: 0.5 10*3/uL — ABNORMAL LOW (ref 0.9–3.3)

## 2014-01-23 MED ORDER — DIPHENHYDRAMINE HCL 50 MG/ML IJ SOLN
12.5000 mg | Freq: Once | INTRAMUSCULAR | Status: AC
  Administered 2014-01-23: 12.5 mg via INTRAVENOUS

## 2014-01-23 MED ORDER — DEXAMETHASONE SODIUM PHOSPHATE 10 MG/ML IJ SOLN
INTRAMUSCULAR | Status: AC
  Filled 2014-01-23: qty 1

## 2014-01-23 MED ORDER — HEPARIN SOD (PORK) LOCK FLUSH 100 UNIT/ML IV SOLN
500.0000 [IU] | Freq: Once | INTRAVENOUS | Status: AC | PRN
  Administered 2014-01-23: 500 [IU]
  Filled 2014-01-23: qty 5

## 2014-01-23 MED ORDER — ONDANSETRON 8 MG/50ML IVPB (CHCC)
8.0000 mg | Freq: Once | INTRAVENOUS | Status: AC
Start: 2014-01-23 — End: 2014-01-23
  Administered 2014-01-23: 8 mg via INTRAVENOUS

## 2014-01-23 MED ORDER — ACETAMINOPHEN 325 MG PO TABS
650.0000 mg | ORAL_TABLET | Freq: Once | ORAL | Status: AC
  Administered 2014-01-23: 650 mg via ORAL

## 2014-01-23 MED ORDER — PACLITAXEL PROTEIN-BOUND CHEMO INJECTION 100 MG
85.0000 mg/m2 | Freq: Once | INTRAVENOUS | Status: AC
  Administered 2014-01-23: 150 mg via INTRAVENOUS
  Filled 2014-01-23: qty 30

## 2014-01-23 MED ORDER — SODIUM CHLORIDE 0.9 % IV SOLN
Freq: Once | INTRAVENOUS | Status: AC
  Administered 2014-01-23: 13:00:00 via INTRAVENOUS

## 2014-01-23 MED ORDER — SODIUM CHLORIDE 0.9 % IJ SOLN
10.0000 mL | INTRAMUSCULAR | Status: DC | PRN
  Administered 2014-01-23: 10 mL
  Filled 2014-01-23: qty 10

## 2014-01-23 MED ORDER — ONDANSETRON 8 MG/NS 50 ML IVPB
INTRAVENOUS | Status: AC
  Filled 2014-01-23: qty 8

## 2014-01-23 MED ORDER — DEXAMETHASONE SODIUM PHOSPHATE 10 MG/ML IJ SOLN
10.0000 mg | Freq: Once | INTRAMUSCULAR | Status: AC
  Administered 2014-01-23: 10 mg via INTRAVENOUS

## 2014-01-23 MED ORDER — ACETAMINOPHEN 325 MG PO TABS
ORAL_TABLET | ORAL | Status: AC
  Filled 2014-01-23: qty 2

## 2014-01-23 MED ORDER — TRASTUZUMAB CHEMO INJECTION 440 MG
2.0000 mg/kg | Freq: Once | INTRAVENOUS | Status: AC
  Administered 2014-01-23: 147 mg via INTRAVENOUS
  Filled 2014-01-23: qty 7

## 2014-01-23 MED ORDER — DIPHENHYDRAMINE HCL 50 MG/ML IJ SOLN
INTRAMUSCULAR | Status: AC
  Filled 2014-01-23: qty 1

## 2014-01-23 NOTE — Telephone Encounter (Signed)
, °

## 2014-01-23 NOTE — Patient Instructions (Signed)
[facility] Discharge Instructions for Patients Receiving Chemotherapy  Today you received the following chemotherapy agents Abraxane and Herceptin  To help prevent nausea and vomiting after your treatment, we encourage you to take your nausea medication as prescribed    If you develop nausea and vomiting that is not controlled by your nausea medication, call the clinic.   BELOW ARE SYMPTOMS THAT SHOULD BE REPORTED IMMEDIATELY:  *FEVER GREATER THAN 100.5 F  *CHILLS WITH OR WITHOUT FEVER  NAUSEA AND VOMITING THAT IS NOT CONTROLLED WITH YOUR NAUSEA MEDICATION  *UNUSUAL SHORTNESS OF BREATH  *UNUSUAL BRUISING OR BLEEDING  TENDERNESS IN MOUTH AND THROAT WITH OR WITHOUT PRESENCE OF ULCERS  *URINARY PROBLEMS  *BOWEL PROBLEMS  UNUSUAL RASH Items with * indicate a potential emergency and should be followed up as soon as possible.  Feel free to call the clinic you have any questions or concerns. The clinic phone number is [phone].

## 2014-01-23 NOTE — Progress Notes (Signed)
Patient Care Team: Nicoletta Dress, MD as PCP - General (Internal Medicine) Deboraha Sprang, MD as Attending Physician (Cardiology) Rulon Eisenmenger, MD as Consulting Physician (Hematology and Oncology)  DIAGNOSIS: Breast cancer of lower-outer quadrant of left female breast   Staging form: Breast, AJCC 7th Edition     Clinical: Stage IA (T1c, N0, cM0) - Signed by Rulon Eisenmenger, MD on 10/04/2013     Pathologic: No stage assigned - Unsigned   SUMMARY OF ONCOLOGIC HISTORY:   Breast cancer of lower-outer quadrant of left female breast   09/17/2013 Initial Diagnosis Left breast: invasive ductal cancer ER 100% PR 98% HER-2 amplified ratio 2.6 with gene copy #3.25 Ki-67 is 78%   09/27/2013 Breast MRI Left breast 1 cm mass with linear non-mass enhancement consistent with biopsy-proven DCIS dispense 1.8 cm. No pathologic lymphadenopathy   10/18/2013 Surgery left breast lumpectomy invasive ductal carcinoma grade 2.4 cm with high-grade DCIS 1 sentinel node negative, ER 100% PR 98% HER-2 amplified ratio 2.6 Ki-67 78% T2, N0, M0 stage II A.   11/21/2013 -  Chemotherapy Weekly Abraxane and Herceptin x 12 weeks planned.      CHIEF COMPLIANT: Week 9 of Abraxane Herceptin  INTERVAL HISTORY: Colleen Galloway is a 79 year old Caucasian with above-mentioned history of left-sided breast cancer treated with lumpectomy and is currently on adjuvant chemotherapy with weekly Abraxane Herceptin x12. Today is week 9. She is tolerating chemotherapy fairly well except for hair loss as well as fatigue symptoms. She is also experiencing lack of taste. Denies any fevers or chills nausea vomiting.  REVIEW OF SYSTEMS:   Constitutional: Denies fevers, chills or abnormal weight loss Eyes: Denies blurriness of vision Ears, nose, mouth, throat, and face: Denies mucositis or sore throat Respiratory: Denies cough, dyspnea or wheezes Cardiovascular: Denies palpitation, chest discomfort or lower extremity swelling Gastrointestinal:   constipation Skin: Denies abnormal skin rashes Lymphatics: Denies new lymphadenopathy or easy bruising Neurological:Denies numbness, tingling or new weaknesses Behavioral/Psych: Mood is stable, no new changes  Breast:  denies any pain or lumps or nodules in either breasts All other systems were reviewed with the patient and are negative.  I have reviewed the past medical history, past surgical history, social history and family history with the patient and they are unchanged from previous note.  ALLERGIES:  is allergic to diltiazem hcl; flecainide; lopressor; nebivolol; and sodium pantothenate.  MEDICATIONS:  Current Outpatient Prescriptions  Medication Sig Dispense Refill  . acetaminophen (TYLENOL) 500 MG tablet Take 500 mg by mouth every 6 (six) hours as needed.    . calcium citrate-vitamin D (CITRACAL+D) 315-200 MG-UNIT per tablet Take 1 tablet by mouth daily.     . chlorthalidone (HYGROTON) 25 MG tablet Take 25 mg by mouth daily as needed (for fluid).     Marland Kitchen dexamethasone (DECADRON) 4 MG tablet Take 2 tablets (8 mg total) by mouth 2 (two) times daily with a meal. Start the day after chemotherapy for 2 days. Take with food. 30 tablet 1  . doxycycline (ADOXA) 50 MG tablet Take 50 mg by mouth daily as needed (for rosacea).     . lidocaine (XYLOCAINE) 2 % solution     . lidocaine-prilocaine (EMLA) cream Apply 1 application topically as needed. 30 g 0  . LORazepam (ATIVAN) 0.5 MG tablet Take 1 tablet (0.5 mg total) by mouth every 6 (six) hours as needed (Nausea or vomiting). 30 tablet 0  . meclizine (ANTIVERT) 25 MG tablet Take 1 tablet (25 mg total) by  mouth 3 (three) times daily as needed for dizziness. 90 tablet 2  . meloxicam (MOBIC) 15 MG tablet Take 15 mg by mouth daily as needed for pain.     . metoprolol succinate (TOPROL-XL) 25 MG 24 hr tablet Take 1 tablet (25 mg total) by mouth daily. 90 tablet 1  . Multiple Vitamin (MULTIVITAMIN) capsule Take 1 capsule by mouth daily.      .  Naphazoline-Pheniramine (OPCON-A) 0.027-0.315 % SOLN Place 1 drop into the left eye 2 (two) times daily as needed (for allergies).    . Omega-3 Fatty Acids (FISH OIL) 1200 MG CAPS Take 1 capsule by mouth every morning.     . Omega-3 Fatty Acids (SALMON OIL-1000) 200 MG CAPS Take 1 capsule by mouth every evening.     Marland Kitchen omeprazole (PRILOSEC) 20 MG capsule Take 20 mg by mouth daily.      . ondansetron (ZOFRAN) 8 MG tablet Take 1 tablet (8 mg total) by mouth 2 (two) times daily. Start the day after chemo for 2 days. Then take as needed for nausea or vomiting. 30 tablet 1  . prochlorperazine (COMPAZINE) 10 MG tablet Take 1 tablet (10 mg total) by mouth every 6 (six) hours as needed (Nausea or vomiting). (Patient not taking: Reported on 01/10/2014) 30 tablet 1  . Propylene Glycol (SYSTANE BALANCE) 0.6 % SOLN Apply 1 drop to eye 2 (two) times daily.    Marland Kitchen tobramycin (TOBREX) 0.3 % ophthalmic solution Place 1 drop into the left eye 4 (four) times daily.    . traZODone (DESYREL) 50 MG tablet Take 50-100 mg by mouth at bedtime.     . Vaginal Lubricant (REPLENS) GEL Place 1 Applicatorful vaginally daily as needed (for dryness).    . valACYclovir (VALTREX) 500 MG tablet Take 1 tablet (500 mg total) by mouth 2 (two) times daily. 60 tablet 0  . vitamin C (ASCORBIC ACID) 500 MG tablet Take 500 mg by mouth daily.      Alveda Reasons 20 MG TABS tablet      No current facility-administered medications for this visit.    PHYSICAL EXAMINATION: ECOG PERFORMANCE STATUS: 1 - Symptomatic but completely ambulatory  Filed Vitals:   01/23/14 1111  BP: 125/80  Pulse: 90  Temp: 98.3 F (36.8 C)  Resp: 18   Filed Weights   01/23/14 1111  Weight: 165 lb 8 oz (75.07 kg)    GENERAL:alert, no distress and comfortable SKIN: skin color, texture, turgor are normal, no rashes or significant lesions EYES: normal, Conjunctiva are pink and non-injected, sclera clear OROPHARYNX:no exudate, no erythema and lips, buccal mucosa,  and tongue normal  NECK: supple, thyroid normal size, non-tender, without nodularity LYMPH:  no palpable lymphadenopathy in the cervical, axillary or inguinal LUNGS: clear to auscultation and percussion with normal breathing effort HEART: regular rate & rhythm and no murmurs and no lower extremity edema ABDOMEN:abdomen soft, non-tender and normal bowel sounds Musculoskeletal:no cyanosis of digits and no clubbing  NEURO: alert & oriented x 3 with fluent speech, no focal motor/sensory deficits  LABORATORY DATA:  I have reviewed the data as listed   Chemistry      Component Value Date/Time   NA 136 01/23/2014 1047   NA 140 10/15/2013 1308   K 3.4* 01/23/2014 1047   K 4.3 10/15/2013 1308   CL 102 10/15/2013 1308   CO2 25 01/23/2014 1047   CO2 26 10/15/2013 1308   BUN 16.2 01/23/2014 1047   BUN 12 10/15/2013 1308  CREATININE 0.9 01/23/2014 1047   CREATININE 0.81 10/15/2013 1308      Component Value Date/Time   CALCIUM 9.5 01/23/2014 1047   CALCIUM 9.8 10/15/2013 1308   ALKPHOS 103 01/23/2014 1047   ALKPHOS 92 10/15/2013 1308   AST 19 01/23/2014 1047   AST 37 10/15/2013 1308   ALT 25 01/23/2014 1047   ALT 32 10/15/2013 1308   BILITOT 0.51 01/23/2014 1047   BILITOT 0.4 10/15/2013 1308       Lab Results  Component Value Date   WBC 6.0 01/23/2014   HGB 10.1* 01/23/2014   HCT 30.5* 01/23/2014   MCV 90.0 01/23/2014   PLT 241 01/23/2014   NEUTROABS 5.1 01/23/2014   ASSESSMENT & PLAN:  Breast cancer of lower-outer quadrant of left female breast Left breast invasive ductal carcinoma ER/PR positive HER-2 positive status post lumpectomy T2, N0, M0 stage II A., 2.4 cm, currently on adjuvant chemotherapy with Abraxane Herceptin weekly x12 followed by Herceptin every 3 weeks for a year  Chemotherapy toxicities: Today is week 9 of 12.  She reports that the taste is not fully better and she is somewhat diminished appetite. She denies any nausea vomiting or fevers or chills. 1.  Maculopapular rash on the arms much improved 2. Oral mucositis: reduced chemotherapy Abraxane down to 85 mg per meter square from week 8 3. Fatigue related to chemotherapy 4. Alopecia related to chemotherapy 5. Therapy related dysguesia 6. Chemotherapy-induced anemia: Stable  Return to clinic next week for toxicity check and followup as well as to receive week 10 of 12 We'll request radiation oncology consultation for January 2016 Monitoring closely for toxicities   No orders of the defined types were placed in this encounter.   The patient has a good understanding of the overall plan. she agrees with it. She will call with any problems that may develop before her next visit here.   Rulon Eisenmenger, MD 01/23/2014 12:36 PM

## 2014-01-23 NOTE — Assessment & Plan Note (Addendum)
Left breast invasive ductal carcinoma ER/PR positive HER-2 positive status post lumpectomy T2, N0, M0 stage II A., 2.4 cm, currently on adjuvant chemotherapy with Abraxane Herceptin weekly x12 followed by Herceptin every 3 weeks for a year  Chemotherapy toxicities: Today is week 9 of 12.  She reports that the taste is not fully better and she is somewhat diminished appetite. She denies any nausea vomiting or fevers or chills. 1. Maculopapular rash on the arms much improved 2. Oral mucositis: reduced chemotherapy Abraxane down to 85 mg per meter square from week 8 3. Fatigue related to chemotherapy 4. Alopecia related to chemotherapy 5. Therapy related dysguesia 6. Chemotherapy-induced anemia: Stable  Return to clinic next week for toxicity check and followup as well as to receive week 10 of 12 We'll request radiation oncology consultation for January 2016 Monitoring closely for toxicities 

## 2014-01-29 ENCOUNTER — Ambulatory Visit: Payer: Self-pay | Admitting: Radiation Oncology

## 2014-01-30 ENCOUNTER — Ambulatory Visit (HOSPITAL_BASED_OUTPATIENT_CLINIC_OR_DEPARTMENT_OTHER): Payer: Commercial Managed Care - HMO | Admitting: Hematology and Oncology

## 2014-01-30 ENCOUNTER — Ambulatory Visit (HOSPITAL_BASED_OUTPATIENT_CLINIC_OR_DEPARTMENT_OTHER): Payer: Commercial Managed Care - HMO

## 2014-01-30 ENCOUNTER — Ambulatory Visit: Payer: Commercial Managed Care - HMO | Admitting: Adult Health

## 2014-01-30 ENCOUNTER — Telehealth: Payer: Self-pay | Admitting: Hematology and Oncology

## 2014-01-30 ENCOUNTER — Other Ambulatory Visit (HOSPITAL_BASED_OUTPATIENT_CLINIC_OR_DEPARTMENT_OTHER): Payer: Commercial Managed Care - HMO

## 2014-01-30 ENCOUNTER — Inpatient Hospital Stay: Admission: RE | Admit: 2014-01-30 | Payer: Self-pay | Source: Ambulatory Visit | Admitting: Radiation Oncology

## 2014-01-30 ENCOUNTER — Telehealth: Payer: Self-pay | Admitting: *Deleted

## 2014-01-30 DIAGNOSIS — C50812 Malignant neoplasm of overlapping sites of left female breast: Secondary | ICD-10-CM

## 2014-01-30 DIAGNOSIS — C50512 Malignant neoplasm of lower-outer quadrant of left female breast: Secondary | ICD-10-CM

## 2014-01-30 DIAGNOSIS — Z5111 Encounter for antineoplastic chemotherapy: Secondary | ICD-10-CM

## 2014-01-30 LAB — CBC WITH DIFFERENTIAL/PLATELET
BASO%: 0.8 % (ref 0.0–2.0)
BASOS ABS: 0 10*3/uL (ref 0.0–0.1)
EOS%: 0.6 % (ref 0.0–7.0)
Eosinophils Absolute: 0 10*3/uL (ref 0.0–0.5)
HEMATOCRIT: 29.5 % — AB (ref 34.8–46.6)
HGB: 9.7 g/dL — ABNORMAL LOW (ref 11.6–15.9)
LYMPH#: 0.7 10*3/uL — AB (ref 0.9–3.3)
LYMPH%: 13.3 % — ABNORMAL LOW (ref 14.0–49.7)
MCH: 29.9 pg (ref 25.1–34.0)
MCHC: 32.8 g/dL (ref 31.5–36.0)
MCV: 91 fL (ref 79.5–101.0)
MONO#: 0.3 10*3/uL (ref 0.1–0.9)
MONO%: 5.5 % (ref 0.0–14.0)
NEUT#: 4.2 10*3/uL (ref 1.5–6.5)
NEUT%: 79.8 % — AB (ref 38.4–76.8)
Platelets: 232 10*3/uL (ref 145–400)
RBC: 3.24 10*6/uL — ABNORMAL LOW (ref 3.70–5.45)
RDW: 18.8 % — ABNORMAL HIGH (ref 11.2–14.5)
WBC: 5.3 10*3/uL (ref 3.9–10.3)

## 2014-01-30 LAB — COMPREHENSIVE METABOLIC PANEL (CC13)
ALT: 23 U/L (ref 0–55)
AST: 20 U/L (ref 5–34)
Albumin: 2.8 g/dL — ABNORMAL LOW (ref 3.5–5.0)
Alkaline Phosphatase: 103 U/L (ref 40–150)
Anion Gap: 10 mEq/L (ref 3–11)
BUN: 14.7 mg/dL (ref 7.0–26.0)
CALCIUM: 9.4 mg/dL (ref 8.4–10.4)
CHLORIDE: 102 meq/L (ref 98–109)
CO2: 26 meq/L (ref 22–29)
Creatinine: 0.8 mg/dL (ref 0.6–1.1)
EGFR: 70 mL/min/{1.73_m2} — ABNORMAL LOW (ref >90)
Glucose: 150 mg/dl — ABNORMAL HIGH (ref 70–140)
Potassium: 3.3 mEq/L — ABNORMAL LOW (ref 3.5–5.1)
Sodium: 138 mEq/L (ref 136–145)
Total Bilirubin: 0.38 mg/dL (ref 0.20–1.20)
Total Protein: 5.9 g/dL — ABNORMAL LOW (ref 6.4–8.3)

## 2014-01-30 LAB — TECHNOLOGIST REVIEW

## 2014-01-30 MED ORDER — ACETAMINOPHEN 325 MG PO TABS
650.0000 mg | ORAL_TABLET | Freq: Once | ORAL | Status: AC
  Administered 2014-01-30: 650 mg via ORAL

## 2014-01-30 MED ORDER — DEXAMETHASONE SODIUM PHOSPHATE 10 MG/ML IJ SOLN
INTRAMUSCULAR | Status: AC
  Filled 2014-01-30: qty 1

## 2014-01-30 MED ORDER — HEPARIN SOD (PORK) LOCK FLUSH 100 UNIT/ML IV SOLN
500.0000 [IU] | Freq: Once | INTRAVENOUS | Status: AC | PRN
  Administered 2014-01-30: 500 [IU]
  Filled 2014-01-30: qty 5

## 2014-01-30 MED ORDER — TRASTUZUMAB CHEMO INJECTION 440 MG
2.0000 mg/kg | Freq: Once | INTRAVENOUS | Status: AC
  Administered 2014-01-30: 147 mg via INTRAVENOUS
  Filled 2014-01-30: qty 7

## 2014-01-30 MED ORDER — SODIUM CHLORIDE 0.9 % IV SOLN
Freq: Once | INTRAVENOUS | Status: AC
  Administered 2014-01-30: 13:00:00 via INTRAVENOUS

## 2014-01-30 MED ORDER — SODIUM CHLORIDE 0.9 % IJ SOLN
10.0000 mL | INTRAMUSCULAR | Status: DC | PRN
  Administered 2014-01-30: 10 mL
  Filled 2014-01-30: qty 10

## 2014-01-30 MED ORDER — DIPHENHYDRAMINE HCL 25 MG PO CAPS: 50.0000 mg | ORAL_CAPSULE | Freq: Once | ORAL | Status: DC

## 2014-01-30 MED ORDER — ONDANSETRON 8 MG/NS 50 ML IVPB
INTRAVENOUS | Status: AC
  Filled 2014-01-30: qty 8

## 2014-01-30 MED ORDER — ACETAMINOPHEN 325 MG PO TABS
ORAL_TABLET | ORAL | Status: AC
  Filled 2014-01-30: qty 2

## 2014-01-30 MED ORDER — ONDANSETRON 8 MG/50ML IVPB (CHCC)
8.0000 mg | Freq: Once | INTRAVENOUS | Status: AC
  Administered 2014-01-30: 8 mg via INTRAVENOUS

## 2014-01-30 MED ORDER — PACLITAXEL PROTEIN-BOUND CHEMO INJECTION 100 MG
85.0000 mg/m2 | Freq: Once | INTRAVENOUS | Status: AC
  Administered 2014-01-30: 150 mg via INTRAVENOUS
  Filled 2014-01-30: qty 30

## 2014-01-30 MED ORDER — DIPHENHYDRAMINE HCL 50 MG/ML IJ SOLN
INTRAMUSCULAR | Status: AC
  Filled 2014-01-30: qty 1

## 2014-01-30 MED ORDER — DIPHENHYDRAMINE HCL 50 MG/ML IJ SOLN
12.5000 mg | Freq: Once | INTRAMUSCULAR | Status: AC
  Administered 2014-01-30: 12.5 mg via INTRAVENOUS

## 2014-01-30 MED ORDER — DEXAMETHASONE SODIUM PHOSPHATE 10 MG/ML IJ SOLN
10.0000 mg | Freq: Once | INTRAMUSCULAR | Status: AC
  Administered 2014-01-30: 10 mg via INTRAVENOUS

## 2014-01-30 NOTE — Progress Notes (Signed)
Patient Care Team: Nicoletta Dress, MD as PCP - General (Internal Medicine) Deboraha Sprang, MD as Attending Physician (Cardiology) Rulon Eisenmenger, MD as Consulting Physician (Hematology and Oncology)  DIAGNOSIS: Breast cancer of lower-outer quadrant of left female breast   Staging form: Breast, AJCC 7th Edition     Clinical: Stage IA (T1c, N0, cM0) - Signed by Rulon Eisenmenger, MD on 10/04/2013     Pathologic: No stage assigned - Unsigned   SUMMARY OF ONCOLOGIC HISTORY:   Breast cancer of lower-outer quadrant of left female breast   09/17/2013 Initial Diagnosis Left breast: invasive ductal cancer ER 100% PR 98% HER-2 amplified ratio 2.6 with gene copy #3.25 Ki-67 is 78%   09/27/2013 Breast MRI Left breast 1 cm mass with linear non-mass enhancement consistent with biopsy-proven DCIS dispense 1.8 cm. No pathologic lymphadenopathy   10/18/2013 Surgery left breast lumpectomy invasive ductal carcinoma grade 2.4 cm with high-grade DCIS 1 sentinel node negative, ER 100% PR 98% HER-2 amplified ratio 2.6 Ki-67 78% T2, N0, M0 stage II A.   11/21/2013 -  Chemotherapy Weekly Abraxane and Herceptin x 12 weeks planned.      CHIEF COMPLIANT: Fatigue, hair loss, loss of appetite, week 10 of Abraxane and Herceptin today  INTERVAL HISTORY: Colleen Galloway is a 79 year old Caucasian with above-mentioned history of left breast cancer currently on adjuvant chemotherapy with Abraxane and Herceptin. Today is week 10 of treatment. Her blood counts are adequate for treatment. Her major complaints of being fatigued and loss of appetite. She is also very sad about her hair loss. She occasionally feels cold and chills. But denies any fevers.  REVIEW OF SYSTEMS:   Constitutional: Denies fevers, chills or abnormal weight loss Eyes: Denies blurriness of vision Ears, nose, mouth, throat, and face: Denies mucositis or sore throat Respiratory: Denies cough, dyspnea or wheezes Cardiovascular: Denies palpitation, chest discomfort  or lower extremity swelling Gastrointestinal:  Denies nausea, heartburn or change in bowel habits Skin: Denies abnormal skin rashes Lymphatics: Denies new lymphadenopathy or easy bruising Neurological:Denies numbness, tingling or new weaknesses Behavioral/Psych: Mood is stable, no new changes  Breast:  denies any pain or lumps or nodules in either breasts All other systems were reviewed with the patient and are negative.  I have reviewed the past medical history, past surgical history, social history and family history with the patient and they are unchanged from previous note.  ALLERGIES:  is allergic to diltiazem hcl; flecainide; lopressor; nebivolol; and sodium pantothenate.  MEDICATIONS:  Current Outpatient Prescriptions  Medication Sig Dispense Refill  . acetaminophen (TYLENOL) 500 MG tablet Take 500 mg by mouth every 6 (six) hours as needed.    . calcium citrate-vitamin D (CITRACAL+D) 315-200 MG-UNIT per tablet Take 1 tablet by mouth daily.     . chlorthalidone (HYGROTON) 25 MG tablet Take 25 mg by mouth daily as needed (for fluid).     Marland Kitchen dexamethasone (DECADRON) 4 MG tablet Take 2 tablets (8 mg total) by mouth 2 (two) times daily with a meal. Start the day after chemotherapy for 2 days. Take with food. 30 tablet 1  . doxycycline (ADOXA) 50 MG tablet Take 50 mg by mouth daily as needed (for rosacea).     . lidocaine (XYLOCAINE) 2 % solution     . lidocaine-prilocaine (EMLA) cream Apply 1 application topically as needed. 30 g 0  . LORazepam (ATIVAN) 0.5 MG tablet Take 1 tablet (0.5 mg total) by mouth every 6 (six) hours as needed (Nausea or  vomiting). 30 tablet 0  . meclizine (ANTIVERT) 25 MG tablet Take 1 tablet (25 mg total) by mouth 3 (three) times daily as needed for dizziness. 90 tablet 2  . meloxicam (MOBIC) 15 MG tablet Take 15 mg by mouth daily as needed for pain.     . metoprolol succinate (TOPROL-XL) 25 MG 24 hr tablet Take 1 tablet (25 mg total) by mouth daily. 90 tablet 1   . Multiple Vitamin (MULTIVITAMIN) capsule Take 1 capsule by mouth daily.      . Naphazoline-Pheniramine (OPCON-A) 0.027-0.315 % SOLN Place 1 drop into the left eye 2 (two) times daily as needed (for allergies).    . Omega-3 Fatty Acids (FISH OIL) 1200 MG CAPS Take 1 capsule by mouth every morning.     . Omega-3 Fatty Acids (SALMON OIL-1000) 200 MG CAPS Take 1 capsule by mouth every evening.     Marland Kitchen omeprazole (PRILOSEC) 20 MG capsule Take 20 mg by mouth daily.      . ondansetron (ZOFRAN) 8 MG tablet Take 1 tablet (8 mg total) by mouth 2 (two) times daily. Start the day after chemo for 2 days. Then take as needed for nausea or vomiting. 30 tablet 1  . prochlorperazine (COMPAZINE) 10 MG tablet Take 1 tablet (10 mg total) by mouth every 6 (six) hours as needed (Nausea or vomiting). 30 tablet 1  . Propylene Glycol (SYSTANE BALANCE) 0.6 % SOLN Apply 1 drop to eye 2 (two) times daily.    Marland Kitchen tobramycin (TOBREX) 0.3 % ophthalmic solution Place 1 drop into the left eye 4 (four) times daily.    . traZODone (DESYREL) 50 MG tablet Take 50-100 mg by mouth at bedtime.     . Vaginal Lubricant (REPLENS) GEL Place 1 Applicatorful vaginally daily as needed (for dryness).    . valACYclovir (VALTREX) 500 MG tablet Take 1 tablet (500 mg total) by mouth 2 (two) times daily. 60 tablet 0  . vitamin C (ASCORBIC ACID) 500 MG tablet Take 500 mg by mouth daily.      Alveda Reasons 20 MG TABS tablet      No current facility-administered medications for this visit.   Facility-Administered Medications Ordered in Other Visits  Medication Dose Route Frequency Provider Last Rate Last Dose  . diphenhydrAMINE (BENADRYL) capsule 50 mg  50 mg Oral Once Rulon Eisenmenger, MD      . heparin lock flush 100 unit/mL  500 Units Intracatheter Once PRN Rulon Eisenmenger, MD      . ondansetron (ZOFRAN) IVPB 8 mg  8 mg Intravenous Once Rulon Eisenmenger, MD   8 mg at 01/30/14 1238  . PACLitaxel-protein bound (ABRAXANE) chemo infusion 150 mg  85 mg/m2  (Treatment Plan Actual) Intravenous Once Rulon Eisenmenger, MD      . sodium chloride 0.9 % injection 10 mL  10 mL Intracatheter PRN Rulon Eisenmenger, MD      . trastuzumab (HERCEPTIN) 147 mg in sodium chloride 0.9 % 250 mL chemo infusion  2 mg/kg (Treatment Plan Actual) Intravenous Once Rulon Eisenmenger, MD        PHYSICAL EXAMINATION: ECOG PERFORMANCE STATUS: 1 - Symptomatic but completely ambulatory  Filed Vitals:   01/30/14 1140  BP: 120/65  Pulse: 68  Temp: 98 F (36.7 C)  Resp: 18   Filed Weights   01/30/14 1140  Weight: 166 lb 6.4 oz (75.479 kg)    GENERAL:alert, no distress and comfortable SKIN: skin color, texture, turgor are normal, no rashes  or significant lesions EYES: normal, Conjunctiva are pink and non-injected, sclera clear OROPHARYNX:no exudate, no erythema and lips, buccal mucosa, and tongue normal  NECK: supple, thyroid normal size, non-tender, without nodularity LYMPH:  no palpable lymphadenopathy in the cervical, axillary or inguinal LUNGS: clear to auscultation and percussion with normal breathing effort HEART: regular rate & rhythm and no murmurs and no lower extremity edema ABDOMEN:abdomen soft, non-tender and normal bowel sounds Musculoskeletal:no cyanosis of digits and no clubbing  NEURO: alert & oriented x 3 with fluent speech, no focal motor/sensory deficits  LABORATORY DATA:  I have reviewed the data as listed   Chemistry      Component Value Date/Time   NA 138 01/30/2014 1037   NA 140 10/15/2013 1308   K 3.3* 01/30/2014 1037   K 4.3 10/15/2013 1308   CL 102 10/15/2013 1308   CO2 26 01/30/2014 1037   CO2 26 10/15/2013 1308   BUN 14.7 01/30/2014 1037   BUN 12 10/15/2013 1308   CREATININE 0.8 01/30/2014 1037   CREATININE 0.81 10/15/2013 1308      Component Value Date/Time   CALCIUM 9.4 01/30/2014 1037   CALCIUM 9.8 10/15/2013 1308   ALKPHOS 103 01/30/2014 1037   ALKPHOS 92 10/15/2013 1308   AST 20 01/30/2014 1037   AST 37 10/15/2013 1308    ALT 23 01/30/2014 1037   ALT 32 10/15/2013 1308   BILITOT 0.38 01/30/2014 1037   BILITOT 0.4 10/15/2013 1308       Lab Results  Component Value Date   WBC 5.3 01/30/2014   HGB 9.7* 01/30/2014   HCT 29.5* 01/30/2014   MCV 91.0 01/30/2014   PLT 232 01/30/2014   NEUTROABS 4.2 01/30/2014   ASSESSMENT & PLAN:  Breast cancer of lower-outer quadrant of left female breast Left breast invasive ductal carcinoma ER/PR positive HER-2 positive status post lumpectomy T2, N0, M0 stage II A., 2.4 cm, currently on adjuvant chemotherapy with Abraxane Herceptin weekly x12 followed by Herceptin every 3 weeks for a year  Chemotherapy toxicities: Today is week 10 of 12.  1. Maculopapular rash on the arms much improved 2. Oral mucositis: reduced chemotherapy Abraxane down to 85 mg per meter square from week 8 3. Fatigue related to chemotherapy 4. Alopecia related to chemotherapy 5. Therapy related dysguesia 6. Chemotherapy-induced anemia: Stable 7. Hypokalemia  8. Hypoalbuminemia: Encourage the patient to eat more protein containing foods  Patient will continue with her chemotherapy and will finish Abraxane Herceptin 03/16/2013. We will consult radiation oncology in [place]. She will continue Herceptin maintenance in the next Herceptin will be given 03/06/2014.    Orders Placed This Encounter  Procedures  . CBC with Differential    Standing Status: Future     Number of Occurrences:      Standing Expiration Date: 01/30/2015  . Comprehensive metabolic panel (Cmet) - [facility]    Standing Status: Future     Number of Occurrences:      Standing Expiration Date: 01/30/2015   The patient has a good understanding of the overall plan. she agrees with it. She will call with any problems that may develop before her next visit here.   Rulon Eisenmenger, MD 01/30/2014 12:43 PM

## 2014-01-30 NOTE — Telephone Encounter (Signed)
, °

## 2014-01-30 NOTE — Assessment & Plan Note (Signed)
Left breast invasive ductal carcinoma ER/PR positive HER-2 positive status post lumpectomy T2, N0, M0 stage II A., 2.4 cm, currently on adjuvant chemotherapy with Abraxane Herceptin weekly x12 followed by Herceptin every 3 weeks for a year  Chemotherapy toxicities: Today is week 10 of 12.  1. Maculopapular rash on the arms much improved 2. Oral mucositis: reduced chemotherapy Abraxane down to 85 mg per meter square from week 8 3. Fatigue related to chemotherapy 4. Alopecia related to chemotherapy 5. Therapy related dysguesia 6. Chemotherapy-induced anemia: Stable 7. Hypokalemia  8. Hypoalbuminemia: Encourage the patient to eat more protein containing foods  Patient will continue with her chemotherapy and will finish Abraxane Herceptin 03/16/2013. We will consult radiation oncology in [place]. She will continue Herceptin maintenance in the next Herceptin will be given 03/06/2014.

## 2014-01-30 NOTE — Telephone Encounter (Signed)
Per staff phone call and POF I have schedueld appts. Scheduler advised of appts.  JMW  

## 2014-01-30 NOTE — Patient Instructions (Signed)
[facility] Discharge Instructions for Patients Receiving Chemotherapy  Today you received the following chemotherapy agents: Herceptin, Abraxane  To help prevent nausea and vomiting after your treatment, we encourage you to take your nausea medication as prescribed by your physician.   If you develop nausea and vomiting that is not controlled by your nausea medication, call the clinic.   BELOW ARE SYMPTOMS THAT SHOULD BE REPORTED IMMEDIATELY:  *FEVER GREATER THAN 100.5 F  *CHILLS WITH OR WITHOUT FEVER  NAUSEA AND VOMITING THAT IS NOT CONTROLLED WITH YOUR NAUSEA MEDICATION  *UNUSUAL SHORTNESS OF BREATH  *UNUSUAL BRUISING OR BLEEDING  TENDERNESS IN MOUTH AND THROAT WITH OR WITHOUT PRESENCE OF ULCERS  *URINARY PROBLEMS  *BOWEL PROBLEMS  UNUSUAL RASH Items with * indicate a potential emergency and should be followed up as soon as possible.  Feel free to call the clinic you have any questions or concerns. The clinic phone number is [phone].

## 2014-01-31 ENCOUNTER — Telehealth: Payer: Self-pay | Admitting: Hematology and Oncology

## 2014-01-31 ENCOUNTER — Telehealth: Payer: Self-pay | Admitting: *Deleted

## 2014-01-31 NOTE — Telephone Encounter (Signed)
, °

## 2014-01-31 NOTE — Telephone Encounter (Signed)
Spoke with patient regarding rad onc referral.  Patient states she wants to be treated at [facility].  Will notify medical oncology

## 2014-02-03 ENCOUNTER — Ambulatory Visit (HOSPITAL_COMMUNITY)
Admission: RE | Admit: 2014-02-03 | Discharge: 2014-02-03 | Disposition: A | Discharge status: Home / Self Care | Payer: Commercial Managed Care - HMO | Source: Ambulatory Visit | Attending: Internal Medicine | Admitting: Internal Medicine

## 2014-02-03 ENCOUNTER — Ambulatory Visit (HOSPITAL_COMMUNITY): Payer: Commercial Managed Care - HMO

## 2014-02-03 ENCOUNTER — Telehealth: Payer: Self-pay | Admitting: Hematology and Oncology

## 2014-02-03 DIAGNOSIS — E785 Hyperlipidemia, unspecified: Secondary | ICD-10-CM | POA: Insufficient documentation

## 2014-02-03 DIAGNOSIS — I4891 Unspecified atrial fibrillation: Secondary | ICD-10-CM | POA: Diagnosis not present

## 2014-02-03 DIAGNOSIS — I5189 Other ill-defined heart diseases: Secondary | ICD-10-CM | POA: Diagnosis not present

## 2014-02-03 DIAGNOSIS — I509 Heart failure, unspecified: Secondary | ICD-10-CM | POA: Insufficient documentation

## 2014-02-03 DIAGNOSIS — C50919 Malignant neoplasm of unspecified site of unspecified female breast: Secondary | ICD-10-CM | POA: Diagnosis not present

## 2014-02-03 DIAGNOSIS — C50512 Malignant neoplasm of lower-outer quadrant of left female breast: Secondary | ICD-10-CM

## 2014-02-03 DIAGNOSIS — I369 Nonrheumatic tricuspid valve disorder, unspecified: Secondary | ICD-10-CM

## 2014-02-03 NOTE — Telephone Encounter (Signed)
Medical records faxed to [facility] [phone]

## 2014-02-03 NOTE — Progress Notes (Signed)
[facility]  2D Echocardiogram completed.  Shelly, RDCS 02/03/2014 9:46 AM

## 2014-02-04 ENCOUNTER — Encounter: Payer: Self-pay | Admitting: Hematology and Oncology

## 2014-02-06 ENCOUNTER — Other Ambulatory Visit (HOSPITAL_BASED_OUTPATIENT_CLINIC_OR_DEPARTMENT_OTHER): Payer: Commercial Managed Care - HMO

## 2014-02-06 ENCOUNTER — Telehealth: Payer: Self-pay | Admitting: Hematology and Oncology

## 2014-02-06 ENCOUNTER — Ambulatory Visit (HOSPITAL_BASED_OUTPATIENT_CLINIC_OR_DEPARTMENT_OTHER): Payer: Commercial Managed Care - HMO | Admitting: Hematology and Oncology

## 2014-02-06 ENCOUNTER — Ambulatory Visit (HOSPITAL_BASED_OUTPATIENT_CLINIC_OR_DEPARTMENT_OTHER): Payer: Commercial Managed Care - HMO

## 2014-02-06 VITALS — BP 126/66 | HR 97 | Temp 97.6°F | Resp 18 | Ht 64.0 in | Wt 166.0 lb

## 2014-02-06 DIAGNOSIS — D6481 Anemia due to antineoplastic chemotherapy: Secondary | ICD-10-CM

## 2014-02-06 DIAGNOSIS — C50212 Malignant neoplasm of upper-inner quadrant of left female breast: Secondary | ICD-10-CM

## 2014-02-06 DIAGNOSIS — C50512 Malignant neoplasm of lower-outer quadrant of left female breast: Secondary | ICD-10-CM

## 2014-02-06 DIAGNOSIS — K1231 Oral mucositis (ulcerative) due to antineoplastic therapy: Secondary | ICD-10-CM

## 2014-02-06 DIAGNOSIS — E8809 Other disorders of plasma-protein metabolism, not elsewhere classified: Secondary | ICD-10-CM

## 2014-02-06 DIAGNOSIS — E876 Hypokalemia: Secondary | ICD-10-CM

## 2014-02-06 DIAGNOSIS — R432 Parageusia: Secondary | ICD-10-CM

## 2014-02-06 DIAGNOSIS — Z5111 Encounter for antineoplastic chemotherapy: Secondary | ICD-10-CM

## 2014-02-06 DIAGNOSIS — R5383 Other fatigue: Secondary | ICD-10-CM

## 2014-02-06 DIAGNOSIS — Z5112 Encounter for antineoplastic immunotherapy: Secondary | ICD-10-CM

## 2014-02-06 LAB — CBC WITH DIFFERENTIAL/PLATELET
BASO%: 1.5 % (ref 0.0–2.0)
Basophils Absolute: 0.1 10*3/uL (ref 0.0–0.1)
EOS ABS: 0 10*3/uL (ref 0.0–0.5)
EOS%: 1.1 % (ref 0.0–7.0)
HCT: 30.2 % — ABNORMAL LOW (ref 34.8–46.6)
HEMOGLOBIN: 9.9 g/dL — AB (ref 11.6–15.9)
LYMPH%: 20.8 % (ref 14.0–49.7)
MCH: 30.7 pg (ref 25.1–34.0)
MCHC: 32.9 g/dL (ref 31.5–36.0)
MCV: 93.4 fL (ref 79.5–101.0)
MONO#: 0.3 10*3/uL (ref 0.1–0.9)
MONO%: 6.2 % (ref 0.0–14.0)
NEUT%: 70.4 % (ref 38.4–76.8)
NEUTROS ABS: 3 10*3/uL (ref 1.5–6.5)
Platelets: 230 10*3/uL (ref 145–400)
RBC: 3.23 10*6/uL — ABNORMAL LOW (ref 3.70–5.45)
RDW: 20.3 % — AB (ref 11.2–14.5)
WBC: 4.3 10*3/uL (ref 3.9–10.3)
lymph#: 0.9 10*3/uL (ref 0.9–3.3)

## 2014-02-06 LAB — COMPREHENSIVE METABOLIC PANEL (CC13)
ALK PHOS: 98 U/L (ref 40–150)
ALT: 28 U/L (ref 0–55)
AST: 25 U/L (ref 5–34)
Albumin: 3.2 g/dL — ABNORMAL LOW (ref 3.5–5.0)
Anion Gap: 9 mEq/L (ref 3–11)
BUN: 11.1 mg/dL (ref 7.0–26.0)
CALCIUM: 9.4 mg/dL (ref 8.4–10.4)
CHLORIDE: 104 meq/L (ref 98–109)
CO2: 27 mEq/L (ref 22–29)
Creatinine: 0.8 mg/dL (ref 0.6–1.1)
EGFR: 69 mL/min/{1.73_m2} — ABNORMAL LOW (ref >90)
GLUCOSE: 163 mg/dL — AB (ref 70–140)
POTASSIUM: 3.8 meq/L (ref 3.5–5.1)
SODIUM: 140 meq/L (ref 136–145)
TOTAL PROTEIN: 6.1 g/dL — AB (ref 6.4–8.3)
Total Bilirubin: 0.38 mg/dL (ref 0.20–1.20)

## 2014-02-06 LAB — TECHNOLOGIST REVIEW: Technologist Review: 1

## 2014-02-06 MED ORDER — ONDANSETRON 8 MG/50ML IVPB (CHCC)
8.0000 mg | Freq: Once | INTRAVENOUS | Status: AC
  Administered 2014-02-06: 8 mg via INTRAVENOUS

## 2014-02-06 MED ORDER — TRASTUZUMAB CHEMO INJECTION 440 MG
2.0000 mg/kg | Freq: Once | INTRAVENOUS | Status: AC
  Administered 2014-02-06: 147 mg via INTRAVENOUS
  Filled 2014-02-06: qty 7

## 2014-02-06 MED ORDER — SODIUM CHLORIDE 0.9 % IV SOLN
Freq: Once | INTRAVENOUS | Status: AC
  Administered 2014-02-06: 13:00:00 via INTRAVENOUS

## 2014-02-06 MED ORDER — SODIUM CHLORIDE 0.9 % IJ SOLN
10.0000 mL | INTRAMUSCULAR | Status: DC | PRN
  Administered 2014-02-06: 10 mL
  Filled 2014-02-06: qty 10

## 2014-02-06 MED ORDER — DEXAMETHASONE SODIUM PHOSPHATE 10 MG/ML IJ SOLN
INTRAMUSCULAR | Status: AC
  Filled 2014-02-06: qty 1

## 2014-02-06 MED ORDER — ACETAMINOPHEN 325 MG PO TABS
ORAL_TABLET | ORAL | Status: AC
  Filled 2014-02-06: qty 2

## 2014-02-06 MED ORDER — ONDANSETRON 8 MG/NS 50 ML IVPB
INTRAVENOUS | Status: AC
  Filled 2014-02-06: qty 8

## 2014-02-06 MED ORDER — ACETAMINOPHEN 325 MG PO TABS
650.0000 mg | ORAL_TABLET | Freq: Once | ORAL | Status: AC
  Administered 2014-02-06: 650 mg via ORAL

## 2014-02-06 MED ORDER — DEXAMETHASONE SODIUM PHOSPHATE 10 MG/ML IJ SOLN
10.0000 mg | Freq: Once | INTRAMUSCULAR | Status: AC
  Administered 2014-02-06: 10 mg via INTRAVENOUS

## 2014-02-06 MED ORDER — DIPHENHYDRAMINE HCL 50 MG/ML IJ SOLN
INTRAMUSCULAR | Status: AC
  Filled 2014-02-06: qty 1

## 2014-02-06 MED ORDER — DIPHENHYDRAMINE HCL 50 MG/ML IJ SOLN
12.5000 mg | Freq: Once | INTRAMUSCULAR | Status: AC
  Administered 2014-02-06: 12.5 mg via INTRAVENOUS

## 2014-02-06 MED ORDER — PACLITAXEL PROTEIN-BOUND CHEMO INJECTION 100 MG
85.0000 mg/m2 | Freq: Once | INTRAVENOUS | Status: AC
  Administered 2014-02-06: 150 mg via INTRAVENOUS
  Filled 2014-02-06: qty 30

## 2014-02-06 MED ORDER — HEPARIN SOD (PORK) LOCK FLUSH 100 UNIT/ML IV SOLN
500.0000 [IU] | Freq: Once | INTRAVENOUS | Status: AC | PRN
  Administered 2014-02-06: 500 [IU]
  Filled 2014-02-06: qty 5

## 2014-02-06 NOTE — Progress Notes (Signed)
Patient Care Team: Nicoletta Dress, MD as PCP - General (Internal Medicine) Deboraha Sprang, MD as Attending Physician (Cardiology) Rulon Eisenmenger, MD as Consulting Physician (Hematology and Oncology)  DIAGNOSIS: Breast cancer of lower-outer quadrant of left female breast   Staging form: Breast, AJCC 7th Edition     Clinical: Stage IA (T1c, N0, cM0) - Signed by Rulon Eisenmenger, MD on 10/04/2013     Pathologic: No stage assigned - Unsigned   SUMMARY OF ONCOLOGIC HISTORY:   Breast cancer of lower-outer quadrant of left female breast   09/17/2013 Initial Diagnosis Left breast: invasive ductal cancer ER 100% PR 98% HER-2 amplified ratio 2.6 with gene copy #3.25 Ki-67 is 78%   09/27/2013 Breast MRI Left breast 1 cm mass with linear non-mass enhancement consistent with biopsy-proven DCIS dispense 1.8 cm. No pathologic lymphadenopathy   10/18/2013 Surgery left breast lumpectomy invasive ductal carcinoma grade 2.4 cm with high-grade DCIS 1 sentinel node negative, ER 100% PR 98% HER-2 amplified ratio 2.6 Ki-67 78% T2, N0, M0 stage II A.   11/21/2013 -  Chemotherapy Weekly Abraxane and Herceptin x 12 weeks planned.      CHIEF COMPLIANT: Week 11 Abraxane  INTERVAL HISTORY: Colleen Galloway is a 79 year old lady with above-mentioned history of left breast cancer currently on adjuvant chemotherapy with Abraxane and Herceptin. She will complete 12 weeks of chemotherapy as of 02/13/2014. She has done fairly well from chemotherapy standpoint without any major adverse effects other than hair loss and fatigue. She had a rash on her hands initially which resolved. She will need to see radiation oncologist for adjuvant radiation therapy. She would like to do this in [place]. Subsequently she will need maintenance Herceptin. She is debating whether to receive maintenance therapy. I would like to make an appointment in [place] for her to see medical oncology as well.  REVIEW OF SYSTEMS:   Constitutional: Denies fevers,  chills or abnormal weight loss Eyes: Denies blurriness of vision Ears, nose, mouth, throat, and face: Denies mucositis or sore throat Respiratory: Denies cough, dyspnea or wheezes Cardiovascular: Denies palpitation, chest discomfort or lower extremity swelling Gastrointestinal:  Denies nausea, heartburn or change in bowel habits Skin: Denies abnormal skin rashes Lymphatics: Denies new lymphadenopathy or easy bruising Neurological:Denies numbness, tingling or new weaknesses Behavioral/Psych: Mood is stable, no new changes  Breast:  denies any pain or lumps or nodules in either breasts All other systems were reviewed with the patient and are negative.  I have reviewed the past medical history, past surgical history, social history and family history with the patient and they are unchanged from previous note.  ALLERGIES:  is allergic to diltiazem hcl; flecainide; lopressor; nebivolol; and sodium pantothenate.  MEDICATIONS:  Current Outpatient Prescriptions  Medication Sig Dispense Refill  . acetaminophen (TYLENOL) 500 MG tablet Take 500 mg by mouth every 6 (six) hours as needed.    . calcium citrate-vitamin D (CITRACAL+D) 315-200 MG-UNIT per tablet Take 1 tablet by mouth daily.     . chlorthalidone (HYGROTON) 25 MG tablet Take 25 mg by mouth daily as needed (for fluid).     Marland Kitchen dexamethasone (DECADRON) 4 MG tablet Take 2 tablets (8 mg total) by mouth 2 (two) times daily with a meal. Start the day after chemotherapy for 2 days. Take with food. 30 tablet 1  . doxycycline (ADOXA) 50 MG tablet Take 50 mg by mouth daily as needed (for rosacea).     . lidocaine (XYLOCAINE) 2 % solution     .  lidocaine-prilocaine (EMLA) cream Apply 1 application topically as needed. 30 g 0  . LORazepam (ATIVAN) 0.5 MG tablet Take 1 tablet (0.5 mg total) by mouth every 6 (six) hours as needed (Nausea or vomiting). 30 tablet 0  . meclizine (ANTIVERT) 25 MG tablet Take 1 tablet (25 mg total) by mouth 3 (three) times  daily as needed for dizziness. 90 tablet 2  . meloxicam (MOBIC) 15 MG tablet Take 15 mg by mouth daily as needed for pain.     . metoprolol succinate (TOPROL-XL) 25 MG 24 hr tablet Take 1 tablet (25 mg total) by mouth daily. 90 tablet 1  . Multiple Vitamin (MULTIVITAMIN) capsule Take 1 capsule by mouth daily.      . Naphazoline-Pheniramine (OPCON-A) 0.027-0.315 % SOLN Place 1 drop into the left eye 2 (two) times daily as needed (for allergies).    . Omega-3 Fatty Acids (FISH OIL) 1200 MG CAPS Take 1 capsule by mouth every morning.     . Omega-3 Fatty Acids (SALMON OIL-1000) 200 MG CAPS Take 1 capsule by mouth every evening.     Marland Kitchen omeprazole (PRILOSEC) 20 MG capsule Take 20 mg by mouth daily.      . ondansetron (ZOFRAN) 8 MG tablet Take 1 tablet (8 mg total) by mouth 2 (two) times daily. Start the day after chemo for 2 days. Then take as needed for nausea or vomiting. 30 tablet 1  . prochlorperazine (COMPAZINE) 10 MG tablet Take 1 tablet (10 mg total) by mouth every 6 (six) hours as needed (Nausea or vomiting). 30 tablet 1  . Propylene Glycol (SYSTANE BALANCE) 0.6 % SOLN Apply 1 drop to eye 2 (two) times daily.    Marland Kitchen tobramycin (TOBREX) 0.3 % ophthalmic solution Place 1 drop into the left eye 4 (four) times daily.    . traZODone (DESYREL) 50 MG tablet Take 50-100 mg by mouth at bedtime.     . Vaginal Lubricant (REPLENS) GEL Place 1 Applicatorful vaginally daily as needed (for dryness).    . valACYclovir (VALTREX) 500 MG tablet Take 1 tablet (500 mg total) by mouth 2 (two) times daily. 60 tablet 0  . vitamin C (ASCORBIC ACID) 500 MG tablet Take 500 mg by mouth daily.      Alveda Reasons 20 MG TABS tablet      No current facility-administered medications for this visit.    PHYSICAL EXAMINATION: ECOG PERFORMANCE STATUS: 1 - Symptomatic but completely ambulatory  Filed Vitals:   02/06/14 1038  BP: 126/66  Pulse: 97  Temp: 97.6 F (36.4 C)  Resp: 18   Filed Weights   02/06/14 1038  Weight: 166  lb (75.297 kg)    GENERAL:alert, no distress and comfortable SKIN: skin color, texture, turgor are normal, no rashes or significant lesions EYES: normal, Conjunctiva are pink and non-injected, sclera clear OROPHARYNX:no exudate, no erythema and lips, buccal mucosa, and tongue normal  NECK: supple, thyroid normal size, non-tender, without nodularity LYMPH:  no palpable lymphadenopathy in the cervical, axillary or inguinal LUNGS: clear to auscultation and percussion with normal breathing effort HEART: regular rate & rhythm and no murmurs and no lower extremity edema ABDOMEN:abdomen soft, non-tender and normal bowel sounds Musculoskeletal:no cyanosis of digits and no clubbing  NEURO: alert & oriented x 3 with fluent speech, no focal motor/sensory deficits  LABORATORY DATA:  I have reviewed the data as listed   Chemistry      Component Value Date/Time   NA 140 02/06/2014 1022   NA 140 10/15/2013  1308   K 3.8 02/06/2014 1022   K 4.3 10/15/2013 1308   CL 102 10/15/2013 1308   CO2 27 02/06/2014 1022   CO2 26 10/15/2013 1308   BUN 11.1 02/06/2014 1022   BUN 12 10/15/2013 1308   CREATININE 0.8 02/06/2014 1022   CREATININE 0.81 10/15/2013 1308      Component Value Date/Time   CALCIUM 9.4 02/06/2014 1022   CALCIUM 9.8 10/15/2013 1308   ALKPHOS 98 02/06/2014 1022   ALKPHOS 92 10/15/2013 1308   AST 25 02/06/2014 1022   AST 37 10/15/2013 1308   ALT 28 02/06/2014 1022   ALT 32 10/15/2013 1308   BILITOT 0.38 02/06/2014 1022   BILITOT 0.4 10/15/2013 1308       Lab Results  Component Value Date   WBC 4.3 02/06/2014   HGB 9.9* 02/06/2014   HCT 30.2* 02/06/2014   MCV 93.4 02/06/2014   PLT 230 02/06/2014   NEUTROABS 3.0 02/06/2014     RADIOGRAPHIC STUDIES: I have personally reviewed the radiology reports and agreed with their findings. No results found.   ASSESSMENT & PLAN:  Breast cancer of lower-outer quadrant of left female breast Left breast invasive ductal carcinoma  ER/PR positive HER-2 positive status post lumpectomy T2, N0, M0 stage II A., 2.4 cm, currently on adjuvant chemotherapy with Abraxane Herceptin weekly x12 followed by Herceptin every 3 weeks for a year  Chemotherapy toxicities: Today is week 11/12 1. Maculopapular rash on the arms much improved 2. Oral mucositis: reduced chemotherapy Abraxane down to 85 mg per meter square from week 8 3. Fatigue related to chemotherapy 4. Alopecia related to chemotherapy 5. Therapy related dysguesia 6. Chemotherapy-induced anemia: Stable 7. Hypokalemia  8. Hypoalbuminemia: Encourage the patient to eat more protein containing foods  Patient will continue with her chemotherapy and will finish Abraxane Herceptin 03/16/2013. We will consult radiation oncology in [place]. Patient would like to see if she can get maintenance Herceptin at [facility]. We will request a medical oncology consultation over there at the same time as her radiation oncology consultation. She will make her decision after meeting with the physicians there whether or not she will continue maintenance Herceptin at [facility] or at [facility]. She will continue Herceptin maintenance in the next Herceptin will need to be given 03/06/2014.    No orders of the defined types were placed in this encounter.   The patient has a good understanding of the overall plan. she agrees with it. She will call with any problems that may develop before her next visit here.   Rulon Eisenmenger, MD 02/06/2014 11:07 AM

## 2014-02-06 NOTE — Telephone Encounter (Signed)
, °

## 2014-02-06 NOTE — Patient Instructions (Signed)
Trastuzumab injection for infusion What is this medicine? TRASTUZUMAB (tras TOO zoo mab) is a monoclonal antibody. It targets a protein called HER2. This protein is found in some stomach and breast cancers. This medicine can stop cancer cell growth. This medicine may be used with other cancer treatments. This medicine may be used for other purposes; ask your health care provider or pharmacist if you have questions. COMMON BRAND NAME(S): Herceptin What should I tell my health care provider before I take this medicine? They need to know if you have any of these conditions: -heart disease -heart failure -infection (especially a virus infection such as chickenpox, cold sores, or herpes) -lung or breathing disease, like asthma -recent or ongoing radiation therapy -an unusual or allergic reaction to trastuzumab, benzyl alcohol, or other medications, foods, dyes, or preservatives -pregnant or trying to get pregnant -breast-feeding How should I use this medicine? This drug is given as an infusion into a vein. It is administered in a hospital or clinic by a specially trained health care professional. Talk to your pediatrician regarding the use of this medicine in children. This medicine is not approved for use in children. Overdosage: If you think you have taken too much of this medicine contact a poison control center or emergency room at once. NOTE: This medicine is only for you. Do not share this medicine with others. What if I miss a dose? It is important not to miss a dose. Call your doctor or health care professional if you are unable to keep an appointment. What may interact with this medicine? -cyclophosphamide -doxorubicin -warfarin This list may not describe all possible interactions. Give your health care provider a list of all the medicines, herbs, non-prescription drugs, or dietary supplements you use. Also tell them if you smoke, drink alcohol, or use illegal drugs. Some items may  interact with your medicine. What should I watch for while using this medicine? Visit your doctor for checks on your progress. Report any side effects. Continue your course of treatment even though you feel ill unless your doctor tells you to stop. Call your doctor or health care professional for advice if you get a fever, chills or sore throat, or other symptoms of a cold or flu. Do not treat yourself. Try to avoid being around people who are sick. You may experience fever, chills and shaking during your first infusion. These effects are usually mild and can be treated with other medicines. Report any side effects during the infusion to your health care professional. Fever and chills usually do not happen with later infusions. What side effects may I notice from receiving this medicine? Side effects that you should report to your doctor or other health care professional as soon as possible: -breathing difficulties -chest pain or palpitations -cough -dizziness or fainting -fever or chills, sore throat -skin rash, itching or hives -swelling of the legs or ankles -unusually weak or tired Side effects that usually do not require medical attention (report to your doctor or other health care professional if they continue or are bothersome): -loss of appetite -headache -muscle aches -nausea This list may not describe all possible side effects. Call your doctor for medical advice about side effects. You may report side effects to FDA at 1-800-FDA-1088. Where should I keep my medicine? This drug is given in a hospital or clinic and will not be stored at home. NOTE: This sheet is a summary. It may not cover all possible information. If you have questions about this medicine, talk   to your doctor, pharmacist, or health care provider.  2015, Elsevier/Gold Standard. (2008-12-05 13:43:15) Nanoparticle Albumin-Bound Paclitaxel injection What is this medicine? NANOPARTICLE ALBUMIN-BOUND PACLITAXEL (Na no  PAHR ti kuhl al BYOO muhn-bound PAK li TAX el) is a chemotherapy drug. It targets fast dividing cells, like cancer cells, and causes these cells to die. This medicine is used to treat advanced breast cancer and advanced lung cancer. This medicine may be used for other purposes; ask your health care provider or pharmacist if you have questions. COMMON BRAND NAME(S): Abraxane What should I tell my health care provider before I take this medicine? They need to know if you have any of these conditions: -kidney disease -liver disease -low blood counts, like low platelets, red blood cells, or white blood cells -recent or ongoing radiation therapy -an unusual or allergic reaction to paclitaxel, albumin, other chemotherapy, other medicines, foods, dyes, or preservatives -pregnant or trying to get pregnant -breast-feeding How should I use this medicine? This drug is given as an infusion into a vein. It is administered in a hospital or clinic by a specially trained health care professional. Talk to your pediatrician regarding the use of this medicine in children. Special care may be needed. Overdosage: If you think you have taken too much of this medicine contact a poison control center or emergency room at once. NOTE: This medicine is only for you. Do not share this medicine with others. What if I miss a dose? It is important not to miss your dose. Call your doctor or health care professional if you are unable to keep an appointment. What may interact with this medicine? -cyclosporine -diazepam -ketoconazole -medicines to increase blood counts like filgrastim, pegfilgrastim, sargramostim -other chemotherapy drugs like cisplatin, doxorubicin, epirubicin, etoposide, teniposide, vincristine -quinidine -testosterone -vaccines -verapamil Talk to your doctor or health care professional before taking any of these medicines: -acetaminophen -aspirin -ibuprofen -ketoprofen -naproxen This list may not  describe all possible interactions. Give your health care provider a list of all the medicines, herbs, non-prescription drugs, or dietary supplements you use. Also tell them if you smoke, drink alcohol, or use illegal drugs. Some items may interact with your medicine. What should I watch for while using this medicine? Your condition will be monitored carefully while you are receiving this medicine. You will need important blood work done while you are taking this medicine. This drug may make you feel generally unwell. This is not uncommon, as chemotherapy can affect healthy cells as well as cancer cells. Report any side effects. Continue your course of treatment even though you feel ill unless your doctor tells you to stop. In some cases, you may be given additional medicines to help with side effects. Follow all directions for their use. Call your doctor or health care professional for advice if you get a fever, chills or sore throat, or other symptoms of a cold or flu. Do not treat yourself. This drug decreases your body's ability to fight infections. Try to avoid being around people who are sick. This medicine may increase your risk to bruise or bleed. Call your doctor or health care professional if you notice any unusual bleeding. Be careful brushing and flossing your teeth or using a toothpick because you may get an infection or bleed more easily. If you have any dental work done, tell your dentist you are receiving this medicine. Avoid taking products that contain aspirin, acetaminophen, ibuprofen, naproxen, or ketoprofen unless instructed by your doctor. These medicines may hide a fever. Do  not become pregnant while taking this medicine. Women should inform their doctor if they wish to become pregnant or think they might be pregnant. There is a potential for serious side effects to an unborn child. Talk to your health care professional or pharmacist for more information. Do not breast-feed an infant  while taking this medicine. Men are advised not to father a child while receiving this medicine. What side effects may I notice from receiving this medicine? Side effects that you should report to your doctor or health care professional as soon as possible: -allergic reactions like skin rash, itching or hives, swelling of the face, lips, or tongue -low blood counts - This drug may decrease the number of white blood cells, red blood cells and platelets. You may be at increased risk for infections and bleeding. -signs of infection - fever or chills, cough, sore throat, pain or difficulty passing urine -signs of decreased platelets or bleeding - bruising, pinpoint red spots on the skin, black, tarry stools, nosebleeds -signs of decreased red blood cells - unusually weak or tired, fainting spells, lightheadedness -breathing problems -changes in vision -chest pain -high or low blood pressure -mouth sores -nausea and vomiting -pain, swelling, redness or irritation at the injection site -pain, tingling, numbness in the hands or feet -slow or irregular heartbeat -swelling of the ankle, feet, hands Side effects that usually do not require medical attention (report to your doctor or health care professional if they continue or are bothersome): -aches, pains -changes in the color of fingernails -diarrhea -hair loss -loss of appetite This list may not describe all possible side effects. Call your doctor for medical advice about side effects. You may report side effects to FDA at 1-800-FDA-1088. Where should I keep my medicine? This drug is given in a hospital or clinic and will not be stored at home. NOTE: This sheet is a summary. It may not cover all possible information. If you have questions about this medicine, talk to your doctor, pharmacist, or health care provider.  2015, Elsevier/Gold Standard. (2012-03-26 16:48:50)

## 2014-02-06 NOTE — Assessment & Plan Note (Addendum)
Left breast invasive ductal carcinoma ER/PR positive HER-2 positive status post lumpectomy T2, N0, M0 stage II A., 2.4 cm, currently on adjuvant chemotherapy with Abraxane Herceptin weekly x12 followed by Herceptin every 3 weeks for a year  Chemotherapy toxicities: Today is week 11  1. Maculopapular rash on the arms much improved 2. Oral mucositis: reduced chemotherapy Abraxane down to 85 mg per meter square from week 8 3. Fatigue related to chemotherapy 4. Alopecia related to chemotherapy 5. Therapy related dysguesia 6. Chemotherapy-induced anemia: Stable 7. Hypokalemia  8. Hypoalbuminemia: Encourage the patient to eat more protein containing foods  Patient will continue with her chemotherapy and will finish Abraxane Herceptin 03/16/2013. We will consult radiation oncology in [place]. Patient would like to see if she can get maintenance Herceptin at [facility]. We will request a medical oncology consultation over there at the same time as her radiation oncology consultation. She will make her decision after meeting with the physicians there whether or not she will continue maintenance Herceptin at [facility] or at [facility]. She will continue Herceptin maintenance in the next Herceptin will be given 03/06/2014.

## 2014-02-12 ENCOUNTER — Other Ambulatory Visit: Payer: Self-pay

## 2014-02-12 ENCOUNTER — Ambulatory Visit (HOSPITAL_BASED_OUTPATIENT_CLINIC_OR_DEPARTMENT_OTHER): Payer: Commercial Managed Care - HMO

## 2014-02-12 ENCOUNTER — Ambulatory Visit (HOSPITAL_BASED_OUTPATIENT_CLINIC_OR_DEPARTMENT_OTHER): Payer: Commercial Managed Care - HMO | Admitting: Hematology and Oncology

## 2014-02-12 ENCOUNTER — Telehealth: Payer: Self-pay | Admitting: Hematology and Oncology

## 2014-02-12 ENCOUNTER — Other Ambulatory Visit (HOSPITAL_BASED_OUTPATIENT_CLINIC_OR_DEPARTMENT_OTHER): Payer: Commercial Managed Care - HMO

## 2014-02-12 VITALS — BP 126/62 | HR 98 | Temp 97.8°F | Resp 18 | Ht 64.0 in | Wt 166.6 lb

## 2014-02-12 DIAGNOSIS — K1230 Oral mucositis (ulcerative), unspecified: Secondary | ICD-10-CM

## 2014-02-12 DIAGNOSIS — R432 Parageusia: Secondary | ICD-10-CM

## 2014-02-12 DIAGNOSIS — C50512 Malignant neoplasm of lower-outer quadrant of left female breast: Secondary | ICD-10-CM

## 2014-02-12 DIAGNOSIS — R5383 Other fatigue: Secondary | ICD-10-CM

## 2014-02-12 DIAGNOSIS — E8809 Other disorders of plasma-protein metabolism, not elsewhere classified: Secondary | ICD-10-CM

## 2014-02-12 DIAGNOSIS — L659 Nonscarring hair loss, unspecified: Secondary | ICD-10-CM

## 2014-02-12 DIAGNOSIS — E876 Hypokalemia: Secondary | ICD-10-CM

## 2014-02-12 DIAGNOSIS — C50212 Malignant neoplasm of upper-inner quadrant of left female breast: Secondary | ICD-10-CM

## 2014-02-12 DIAGNOSIS — D6481 Anemia due to antineoplastic chemotherapy: Secondary | ICD-10-CM

## 2014-02-12 DIAGNOSIS — Z170 Estrogen receptor positive status [ER+]: Secondary | ICD-10-CM

## 2014-02-12 DIAGNOSIS — Z5111 Encounter for antineoplastic chemotherapy: Secondary | ICD-10-CM

## 2014-02-12 LAB — CBC WITH DIFFERENTIAL/PLATELET
BASO%: 0.8 % (ref 0.0–2.0)
Basophils Absolute: 0 10*3/uL (ref 0.0–0.1)
EOS ABS: 0 10*3/uL (ref 0.0–0.5)
EOS%: 0.7 % (ref 0.0–7.0)
HCT: 32.2 % — ABNORMAL LOW (ref 34.8–46.6)
HEMOGLOBIN: 10.3 g/dL — AB (ref 11.6–15.9)
LYMPH%: 16.8 % (ref 14.0–49.7)
MCH: 30.9 pg (ref 25.1–34.0)
MCHC: 32 g/dL (ref 31.5–36.0)
MCV: 96.3 fL (ref 79.5–101.0)
MONO#: 0.2 10*3/uL (ref 0.1–0.9)
MONO%: 3.6 % (ref 0.0–14.0)
NEUT%: 78.1 % — ABNORMAL HIGH (ref 38.4–76.8)
NEUTROS ABS: 4.3 10*3/uL (ref 1.5–6.5)
PLATELETS: 225 10*3/uL (ref 145–400)
RBC: 3.34 10*6/uL — ABNORMAL LOW (ref 3.70–5.45)
RDW: 21.8 % — ABNORMAL HIGH (ref 11.2–14.5)
WBC: 5.5 10*3/uL (ref 3.9–10.3)
lymph#: 0.9 10*3/uL (ref 0.9–3.3)

## 2014-02-12 LAB — COMPREHENSIVE METABOLIC PANEL (CC13)
ALBUMIN: 3.1 g/dL — AB (ref 3.5–5.0)
ALT: 23 U/L (ref 0–55)
AST: 21 U/L (ref 5–34)
Alkaline Phosphatase: 94 U/L (ref 40–150)
Anion Gap: 9 mEq/L (ref 3–11)
BILIRUBIN TOTAL: 0.56 mg/dL (ref 0.20–1.20)
BUN: 14.3 mg/dL (ref 7.0–26.0)
CHLORIDE: 107 meq/L (ref 98–109)
CO2: 24 meq/L (ref 22–29)
CREATININE: 0.8 mg/dL (ref 0.6–1.1)
Calcium: 9.2 mg/dL (ref 8.4–10.4)
EGFR: 71 mL/min/{1.73_m2} — AB (ref >90)
GLUCOSE: 180 mg/dL — AB (ref 70–140)
Potassium: 4.2 mEq/L (ref 3.5–5.1)
Sodium: 140 mEq/L (ref 136–145)
TOTAL PROTEIN: 5.9 g/dL — AB (ref 6.4–8.3)

## 2014-02-12 LAB — TECHNOLOGIST REVIEW

## 2014-02-12 MED ORDER — DEXAMETHASONE SODIUM PHOSPHATE 10 MG/ML IJ SOLN
10.0000 mg | Freq: Once | INTRAMUSCULAR | Status: AC
  Administered 2014-02-12: 10 mg via INTRAVENOUS

## 2014-02-12 MED ORDER — DIPHENHYDRAMINE HCL 50 MG/ML IJ SOLN
INTRAMUSCULAR | Status: AC
  Filled 2014-02-12: qty 1

## 2014-02-12 MED ORDER — DIPHENHYDRAMINE HCL 50 MG/ML IJ SOLN
12.5000 mg | Freq: Once | INTRAMUSCULAR | Status: AC
  Administered 2014-02-12: 12.5 mg via INTRAVENOUS

## 2014-02-12 MED ORDER — PACLITAXEL PROTEIN-BOUND CHEMO INJECTION 100 MG
85.0000 mg/m2 | Freq: Once | INTRAVENOUS | Status: AC
  Administered 2014-02-12: 150 mg via INTRAVENOUS
  Filled 2014-02-12: qty 30

## 2014-02-12 MED ORDER — HEPARIN SOD (PORK) LOCK FLUSH 100 UNIT/ML IV SOLN
500.0000 [IU] | Freq: Once | INTRAVENOUS | Status: AC | PRN
  Administered 2014-02-12: 500 [IU]
  Filled 2014-02-12: qty 5

## 2014-02-12 MED ORDER — SODIUM CHLORIDE 0.9 % IV SOLN
Freq: Once | INTRAVENOUS | Status: AC
  Administered 2014-02-12: 13:00:00 via INTRAVENOUS

## 2014-02-12 MED ORDER — ACETAMINOPHEN 325 MG PO TABS
ORAL_TABLET | ORAL | Status: AC
  Filled 2014-02-12: qty 2

## 2014-02-12 MED ORDER — ONDANSETRON 8 MG/50ML IVPB (CHCC)
8.0000 mg | Freq: Once | INTRAVENOUS | Status: AC
  Administered 2014-02-12: 8 mg via INTRAVENOUS

## 2014-02-12 MED ORDER — SODIUM CHLORIDE 0.9 % IJ SOLN
10.0000 mL | INTRAMUSCULAR | Status: DC | PRN
Start: 2014-02-12 — End: 2014-02-12
  Administered 2014-02-12: 10 mL
  Filled 2014-02-12: qty 10

## 2014-02-12 MED ORDER — ONDANSETRON 8 MG/NS 50 ML IVPB
INTRAVENOUS | Status: AC
  Filled 2014-02-12: qty 8

## 2014-02-12 MED ORDER — DEXAMETHASONE SODIUM PHOSPHATE 10 MG/ML IJ SOLN
INTRAMUSCULAR | Status: AC
  Filled 2014-02-12: qty 1

## 2014-02-12 MED ORDER — ACETAMINOPHEN 325 MG PO TABS
650.0000 mg | ORAL_TABLET | Freq: Once | ORAL | Status: AC
  Administered 2014-02-12: 650 mg via ORAL

## 2014-02-12 MED ORDER — TRASTUZUMAB CHEMO INJECTION 440 MG
2.0000 mg/kg | Freq: Once | INTRAVENOUS | Status: AC
  Administered 2014-02-12: 147 mg via INTRAVENOUS
  Filled 2014-02-12: qty 7

## 2014-02-12 NOTE — Assessment & Plan Note (Signed)
Left breast invasive ductal carcinoma ER/PR positive HER-2 positive status post lumpectomy T2, N0, M0 stage II A., 2.4 cm, currently on adjuvant chemotherapy with Abraxane Herceptin weekly x12 followed by Herceptin every 3 weeks for a year  Chemotherapy toxicities: Today is week 11/12 1. Maculopapular rash on the arms much improved 2. Oral mucositis: reduced chemotherapy Abraxane down to 85 mg per meter square from week 8 3. Fatigue related to chemotherapy 4. Alopecia related to chemotherapy 5. Therapy related dysguesia 6. Chemotherapy-induced anemia: Stable hemoglobin has improved to 10.3 7. Hypokalemia  8. Hypoalbuminemia: Encouraged the patient to eat more protein containing foods  Plan: 1. Radiation oncology consultation at [facility] 2. Medical oncology consultation at [facility] for Herceptin maintenance the patient chooses to receive it over there for convenience.

## 2014-02-12 NOTE — Progress Notes (Signed)
Patient Care Team: Nicoletta Dress, MD as PCP - General (Internal Medicine) Deboraha Sprang, MD as Attending Physician (Cardiology) Rulon Eisenmenger, MD as Consulting Physician (Hematology and Oncology)  DIAGNOSIS: Breast cancer of lower-outer quadrant of left female breast   Staging form: Breast, AJCC 7th Edition     Clinical: Stage IA (T1c, N0, cM0) - Signed by Rulon Eisenmenger, MD on 10/04/2013     Pathologic: No stage assigned - Unsigned   SUMMARY OF ONCOLOGIC HISTORY:   Breast cancer of lower-outer quadrant of left female breast   09/17/2013 Initial Diagnosis Left breast: invasive ductal cancer ER 100% PR 98% HER-2 amplified ratio 2.6 with gene copy #3.25 Ki-67 is 78%   09/27/2013 Breast MRI Left breast 1 cm mass with linear non-mass enhancement consistent with biopsy-proven DCIS dispense 1.8 cm. No pathologic lymphadenopathy   10/18/2013 Surgery left breast lumpectomy invasive ductal carcinoma grade 2.4 cm with high-grade DCIS 1 sentinel node negative, ER 100% PR 98% HER-2 amplified ratio 2.6 Ki-67 78% T2, N0, M0 stage II A.   11/21/2013 - 02/12/2014 Chemotherapy Weekly Abraxane and Herceptin x 12 weeks planned.      CHIEF COMPLIANT: week 12 of Abraxane Herceptin , last chemotherapy  INTERVAL HISTORY: Colleen Galloway is a 79 year old lady with above-mentioned history of left-sided breast cancer treated with lumpectomy and she finished 12 weeks of Abraxane Herceptin treatments. Today is her last chemotherapy. She has done extremely well from the treatment with manageable side effects. She denies any nausea vomiting. She has more fatigue than usual. She is awaiting a phone call from [facility] regarding her radiation and medical oncology appointments there. She would like to receive radiation there since it is closer to her home.  REVIEW OF SYSTEMS:   Constitutional: Denies fevers, chills or abnormal weight loss Eyes: Denies blurriness of vision Ears, nose, mouth, throat, and face: Denies  mucositis or sore throat Respiratory: Denies cough, dyspnea or wheezes Cardiovascular: Denies palpitation, chest discomfort or lower extremity swelling Gastrointestinal:  Denies nausea, heartburn or change in bowel habits Skin: Denies abnormal skin rashes Lymphatics: Denies new lymphadenopathy or easy bruising Neurological:Denies numbness, tingling or new weaknesses Behavioral/Psych: Mood is stable, no new changes  Breast:  denies any pain or lumps or nodules in either breasts All other systems were reviewed with the patient and are negative.  I have reviewed the past medical history, past surgical history, social history and family history with the patient and they are unchanged from previous note.  ALLERGIES:  is allergic to diltiazem hcl; flecainide; lopressor; nebivolol; and sodium pantothenate.  MEDICATIONS:  Current Outpatient Prescriptions  Medication Sig Dispense Refill  . acetaminophen (TYLENOL) 500 MG tablet Take 500 mg by mouth every 6 (six) hours as needed.    . calcium citrate-vitamin D (CITRACAL+D) 315-200 MG-UNIT per tablet Take 1 tablet by mouth daily.     . chlorthalidone (HYGROTON) 25 MG tablet Take 25 mg by mouth daily as needed (for fluid).     Marland Kitchen dexamethasone (DECADRON) 4 MG tablet Take 2 tablets (8 mg total) by mouth 2 (two) times daily with a meal. Start the day after chemotherapy for 2 days. Take with food. 30 tablet 1  . doxycycline (ADOXA) 50 MG tablet Take 50 mg by mouth daily as needed (for rosacea).     . lidocaine (XYLOCAINE) 2 % solution     . lidocaine-prilocaine (EMLA) cream Apply 1 application topically as needed. 30 g 0  . LORazepam (ATIVAN) 0.5 MG tablet  Take 1 tablet (0.5 mg total) by mouth every 6 (six) hours as needed (Nausea or vomiting). 30 tablet 0  . meclizine (ANTIVERT) 25 MG tablet Take 1 tablet (25 mg total) by mouth 3 (three) times daily as needed for dizziness. 90 tablet 2  . meloxicam (MOBIC) 15 MG tablet Take 15 mg by mouth daily as needed  for pain.     . metoprolol succinate (TOPROL-XL) 25 MG 24 hr tablet Take 1 tablet (25 mg total) by mouth daily. 90 tablet 1  . Multiple Vitamin (MULTIVITAMIN) capsule Take 1 capsule by mouth daily.      . Naphazoline-Pheniramine (OPCON-A) 0.027-0.315 % SOLN Place 1 drop into the left eye 2 (two) times daily as needed (for allergies).    . Omega-3 Fatty Acids (FISH OIL) 1200 MG CAPS Take 1 capsule by mouth every morning.     . Omega-3 Fatty Acids (SALMON OIL-1000) 200 MG CAPS Take 1 capsule by mouth every evening.     Marland Kitchen omeprazole (PRILOSEC) 20 MG capsule Take 20 mg by mouth daily.      . ondansetron (ZOFRAN) 8 MG tablet Take 1 tablet (8 mg total) by mouth 2 (two) times daily. Start the day after chemo for 2 days. Then take as needed for nausea or vomiting. 30 tablet 1  . prochlorperazine (COMPAZINE) 10 MG tablet Take 1 tablet (10 mg total) by mouth every 6 (six) hours as needed (Nausea or vomiting). 30 tablet 1  . Propylene Glycol (SYSTANE BALANCE) 0.6 % SOLN Apply 1 drop to eye 2 (two) times daily.    Marland Kitchen tobramycin (TOBREX) 0.3 % ophthalmic solution Place 1 drop into the left eye 4 (four) times daily.    . traZODone (DESYREL) 50 MG tablet Take 50-100 mg by mouth at bedtime.     . Vaginal Lubricant (REPLENS) GEL Place 1 Applicatorful vaginally daily as needed (for dryness).    . valACYclovir (VALTREX) 500 MG tablet Take 1 tablet (500 mg total) by mouth 2 (two) times daily. 60 tablet 0  . vitamin C (ASCORBIC ACID) 500 MG tablet Take 500 mg by mouth daily.      Carlena Hurl 20 MG TABS tablet      No current facility-administered medications for this visit.    PHYSICAL EXAMINATION: ECOG PERFORMANCE STATUS: 1 - Symptomatic but completely ambulatory  Filed Vitals:   02/12/14 1103  BP: 126/62  Pulse: 98  Temp: 97.8 F (36.6 C)  Resp: 18   Filed Weights   02/12/14 1103  Weight: 166 lb 9.6 oz (75.569 kg)    GENERAL:alert, no distress and comfortable SKIN: skin color, texture, turgor are  normal, no rashes or significant lesions EYES: normal, Conjunctiva are pink and non-injected, sclera clear OROPHARYNX:no exudate, no erythema and lips, buccal mucosa, and tongue normal  NECK: supple, thyroid normal size, non-tender, without nodularity LYMPH:  no palpable lymphadenopathy in the cervical, axillary or inguinal LUNGS: clear to auscultation and percussion with normal breathing effort HEART: regular rate & rhythm and no murmurs and no lower extremity edema ABDOMEN:abdomen soft, non-tender and normal bowel sounds Musculoskeletal:no cyanosis of digits and no clubbing  NEURO: alert & oriented x 3 with fluent speech, no focal motor/sensory deficits  LABORATORY DATA:  I have reviewed the data as listed   Chemistry      Component Value Date/Time   NA 140 02/12/2014 1032   NA 140 10/15/2013 1308   K 4.2 02/12/2014 1032   K 4.3 10/15/2013 1308   CL 102  10/15/2013 1308   CO2 24 02/12/2014 1032   CO2 26 10/15/2013 1308   BUN 14.3 02/12/2014 1032   BUN 12 10/15/2013 1308   CREATININE 0.8 02/12/2014 1032   CREATININE 0.81 10/15/2013 1308      Component Value Date/Time   CALCIUM 9.2 02/12/2014 1032   CALCIUM 9.8 10/15/2013 1308   ALKPHOS 94 02/12/2014 1032   ALKPHOS 92 10/15/2013 1308   AST 21 02/12/2014 1032   AST 37 10/15/2013 1308   ALT 23 02/12/2014 1032   ALT 32 10/15/2013 1308   BILITOT 0.56 02/12/2014 1032   BILITOT 0.4 10/15/2013 1308       Lab Results  Component Value Date   WBC 5.5 02/12/2014   HGB 10.3* 02/12/2014   HCT 32.2* 02/12/2014   MCV 96.3 02/12/2014   PLT 225 02/12/2014   NEUTROABS 4.3 02/12/2014    ASSESSMENT & PLAN:  Breast cancer of lower-outer quadrant of left female breast Left breast invasive ductal carcinoma ER/PR positive HER-2 positive status post lumpectomy T2, N0, M0 stage II A., 2.4 cm, currently on adjuvant chemotherapy with Abraxane Herceptin weekly x12 followed by Herceptin every 3 weeks for a year  Chemotherapy toxicities:  Today is week 12/12 and this is a last dose of chemotherapy 1. Maculopapular rash on the arms much improved 2. Oral mucositis: reduced chemotherapy Abraxane down to 85 mg per meter square from week 8 3. Fatigue related to chemotherapy 4. Alopecia related to chemotherapy 5. Therapy related dysguesia 6. Chemotherapy-induced anemia: Stable hemoglobin has improved to 10.3 7. Hypokalemia  8. Hypoalbuminemia: Encouraged the patient to eat more protein containing foods  Plan: 1. Radiation oncology consultation at [facility] 2. Medical oncology consultation at [facility] for Herceptin maintenance the patient chooses to receive it over there for convenience. 3. After radiation is complete, she will need antiestrogen therapy.  No orders of the defined types were placed in this encounter.   The patient has a good understanding of the overall plan. she agrees with it. She will call with any problems that may develop before her next visit here.   Rulon Eisenmenger, MD 02/12/2014 11:40 AM

## 2014-02-12 NOTE — Patient Instructions (Signed)
[facility] Discharge Instructions for Patients Receiving Chemotherapy  Today you received the following chemotherapy agents Abraxane/Herceptin.  To help prevent nausea and vomiting after your treatment, we encourage you to take your nausea medication as directed.    If you develop nausea and vomiting that is not controlled by your nausea medication, call the clinic.   BELOW ARE SYMPTOMS THAT SHOULD BE REPORTED IMMEDIATELY:  *FEVER GREATER THAN 100.5 F  *CHILLS WITH OR WITHOUT FEVER  NAUSEA AND VOMITING THAT IS NOT CONTROLLED WITH YOUR NAUSEA MEDICATION  *UNUSUAL SHORTNESS OF BREATH  *UNUSUAL BRUISING OR BLEEDING  TENDERNESS IN MOUTH AND THROAT WITH OR WITHOUT PRESENCE OF ULCERS  *URINARY PROBLEMS  *BOWEL PROBLEMS  UNUSUAL RASH Items with * indicate a potential emergency and should be followed up as soon as possible.  Feel free to call the clinic you have any questions or concerns. The clinic phone number is [phone].

## 2014-02-12 NOTE — Telephone Encounter (Signed)
, °

## 2014-02-13 ENCOUNTER — Telehealth: Payer: Self-pay | Admitting: Hematology and Oncology

## 2014-02-13 NOTE — Telephone Encounter (Signed)
MEDICAL RECORDS FAXED TO DR. MCCARTY @ [facility] [phone]

## 2014-02-17 ENCOUNTER — Telehealth: Payer: Self-pay | Admitting: *Deleted

## 2014-02-17 MED ORDER — RIVAROXABAN 20 MG PO TABS: 20.0000 mg | ORAL_TABLET | Freq: Every day | ORAL | Status: DC

## 2014-02-17 NOTE — Telephone Encounter (Signed)
Patient requests xarelto refill. This was not on her last office visit, but patient stated that she has been taking it. I see where it was removed from her list in September with "stop taking at discharge". Just wanted to clarify whether or not she is to be taking. Please advise. Thanks, MI

## 2014-02-17 NOTE — Telephone Encounter (Signed)
Reviewed with patient, who has been taking this medication for awhile now and only stopped for breast surgeries. No bleeding per patient. No other physician has stopped this medication for any other reason, per patient. Refill sent to [facility] per pt request.

## 2014-02-18 ENCOUNTER — Telehealth: Payer: Self-pay | Admitting: Hematology and Oncology

## 2014-02-18 ENCOUNTER — Telehealth: Payer: Self-pay | Admitting: *Deleted

## 2014-02-18 NOTE — Telephone Encounter (Signed)
Op notes, surgical pathology, insurance card and MRI faxed to [facility].

## 2014-02-18 NOTE — Telephone Encounter (Signed)
Faxed pt medical records to [facility] °

## 2014-02-25 ENCOUNTER — Telehealth: Payer: Self-pay | Admitting: Hematology and Oncology

## 2014-02-25 NOTE — Telephone Encounter (Signed)
Pt appt.with Dr. Orlene Erm is 02/26/14 @ 10:30.  Pt appt. With Dr Hinton Rao is 03/03/14@3 :00. Pt is aware

## 2014-03-04 ENCOUNTER — Telehealth: Payer: Self-pay | Admitting: Genetic Counselor

## 2014-03-04 NOTE — Telephone Encounter (Signed)
S/W PATIENT AND GAVE GENETIC APPT FOR 02/01 @ 9 Shari Prows POWELL

## 2014-03-17 ENCOUNTER — Other Ambulatory Visit: Payer: Commercial Managed Care - HMO

## 2014-03-17 ENCOUNTER — Ambulatory Visit (HOSPITAL_BASED_OUTPATIENT_CLINIC_OR_DEPARTMENT_OTHER): Payer: PPO | Admitting: Genetic Counselor

## 2014-03-17 ENCOUNTER — Encounter: Payer: Self-pay | Admitting: Genetic Counselor

## 2014-03-17 DIAGNOSIS — Z801 Family history of malignant neoplasm of trachea, bronchus and lung: Secondary | ICD-10-CM

## 2014-03-17 DIAGNOSIS — Z315 Encounter for genetic counseling: Secondary | ICD-10-CM

## 2014-03-17 DIAGNOSIS — C50512 Malignant neoplasm of lower-outer quadrant of left female breast: Secondary | ICD-10-CM

## 2014-03-17 DIAGNOSIS — Z800 Family history of malignant neoplasm of digestive organs: Secondary | ICD-10-CM

## 2014-03-17 DIAGNOSIS — C50212 Malignant neoplasm of upper-inner quadrant of left female breast: Secondary | ICD-10-CM

## 2014-03-17 DIAGNOSIS — Z808 Family history of malignant neoplasm of other organs or systems: Secondary | ICD-10-CM

## 2014-03-17 NOTE — Progress Notes (Signed)
REFERRING PROVIDER: Nicoletta Dress, MD [place], [place] [address]   Dr. Hinton Rao  PRIMARY PROVIDER:  Nicoletta Dress, MD  PRIMARY REASON FOR VISIT:  1. Family history of rectal cancer   2. Family history of colon cancer   3. Breast cancer of lower-outer quadrant of left female breast      HISTORY OF PRESENT ILLNESS:   Colleen Galloway, a 80 y.o. female, was seen for a [facility] cancer genetics consultation at the request of Dr. Hinton Rao due to a personal and family history of cancer.  Colleen Galloway presents to clinic today to discuss the possibility of a hereditary predisposition to cancer, genetic testing, and to further clarify her future cancer risks, as well as potential cancer risks for family members.   In 2015, at the age of 85, Colleen Galloway was diagnosed with invasive ductal carcinoma of the left breast. The tumor was triple positive.  This was treated with chemotherapy and herceptin.  She will be put on 5 years of tamoxifen.   CANCER HISTORY:    Breast cancer of lower-outer quadrant of left female breast   09/17/2013 Initial Diagnosis Left breast: invasive ductal cancer ER 100% PR 98% HER-2 amplified ratio 2.6 with gene copy #3.25 Ki-67 is 78%   09/27/2013 Breast MRI Left breast 1 cm mass with linear non-mass enhancement consistent with biopsy-proven DCIS dispense 1.8 cm. No pathologic lymphadenopathy   10/18/2013 Surgery left breast lumpectomy invasive ductal carcinoma grade 2.4 cm with high-grade DCIS 1 sentinel node negative, ER 100% PR 98% HER-2 amplified ratio 2.6 Ki-67 78% T2, N0, M0 stage II A.   11/21/2013 - 02/12/2014 Chemotherapy Weekly Abraxane and Herceptin x 12 weeks planned.       HORMONAL RISK FACTORS:  Menarche was at age 32.  First live birth at age N/A.  OCP use for approximately 0 years.  Ovaries intact: yes.  Hysterectomy: no.  Menopausal status: postmenopausal.  HRT use: 20+ years. Colonoscopy: yes; patient has 3-4 lifetime total  of polyps. Mammogram within the last year: yes. Number of breast biopsies: 2. Up to date with pelvic exams:  no. Any excessive radiation exposure in the past:  no  Past Medical History  Diagnosis Date  . Atrial fibrillation   . Lumbosacral spondylosis without myelopathy   . Carotid artery disease     Followed by Dr. Scot Dock  . Insomnia, unspecified   . Esophageal reflux   . Fibrocystic disease of breast   . Osteoarthrosis, unspecified whether generalized or localized, unspecified site   . Urinary tract infection   . Arthritis   . Unspecified sinusitis (chronic)   . Orthostatic lightheadedness 10/20/2011  . Calculus of kidney     kidney stones  . Breast cancer 09/17/13    Left iNVASIVE DUCTAL,dcis  . Allergy   . PONV (postoperative nausea and vomiting)   . Dysrhythmia   . CHF (congestive heart failure)   . Family history of colon cancer   . Family history of rectal cancer     Past Surgical History  Procedure Laterality Date  . Dilation and curettage of uterus  1960  . Kidney stone surgery    . Varicose veins    . Breast lumpectomy with needle localization and axillary sentinel lymph node bx Left 10/18/2013    Procedure: LEFT AXILLARY LYMPHATIC MAPPING; INJECTION OF METHYLENE BLUE INTO LEFT BREAST; LEFT BREAST PARTIAL MASTECTOMY AFTER NEEDLE LOCALIZATION; AXILLARY SENTINEL LYMPH NODE BIOPSY;  Surgeon: Jackolyn Confer, MD;  Location:  [facility];  Service: General;  Laterality: Left;  . Portacath placement N/A 10/18/2013    Procedure: INSERTION PORT-A-CATH/ULTRASOUND GUIDED,RIGHT INTERNAL JUGULAR;  Surgeon: Jackolyn Confer, MD;  Location: [facility];  Service: General;  Laterality: N/A;    History   Social History  . Marital Status: Widowed    Spouse Name: N/A    Number of Children: N/A  . Years of Education: N/A   Social History Main Topics  . Smoking status: Never Smoker   . Smokeless tobacco: Never Used  . Alcohol Use: No  . Drug Use: No  . Sexual Activity: None   Other Topics  Concern  . None   Social History Narrative   Non smoker/ no tobacco use   Current work/retired   Martial status/widowed   Non drinker/ no alcohol use   Seat belt use/ always wears seatbelt   Caffeine use   Guns in the home           FAMILY HISTORY:  We obtained a detailed, 4-generation family history.  Significant diagnoses are listed below: Family History  Problem Relation Age of Onset  . Heart disease Mother   . Asthma Mother   . Hypertension Mother   . Deep vein thrombosis Mother   . Other Mother     varicose veins  . Lung cancer Mother 12    non-smoker  . Heart disease Father   . Hypertension Father   . Stroke Father   . Heart disease Sister   . Hypertension Sister   . Hyperlipidemia Sister   . Rectal cancer Sister 23  . Heart disease Brother   . Asthma Brother   . Hypertension Brother   . Deep vein thrombosis Brother   . Diabetes Brother   . Colon cancer Other 16    maternal cousin's son; NOS  . Colon cancer Maternal Grandfather 77  . Liver disease Brother 22    Cirrhosis due to infection from gallbladder  . Melanoma Other    The patient was diagnosed with breast cancer at age 69.  She has one brother die at age 60 from pyloric stenosis and another from cirrohis of the liver from gallbladder infection at age 106.  Her sister had rectal cancer at age 60, but has not had a recurrence.  Her sister's daughter had melanoma at an unknown age.  She has 5 paternal half brothers and sisters who are all deceased from heart disease.  HEr mother died of lung cancer and a maternal grandfather had colon cancer.   Patient's maternal ancestors are of [place] descent, and paternal ancestors are of [place] descent. There is no reported Ashkenazi Jewish ancestry. There is no known consanguinity.  GENETIC COUNSELING ASSESSMENT: Colleen Galloway is a 80 y.o. female with a personal and family history of cancer which somewhat suggestive of a sporadic form of cancer. We, therefore, discussed  and recommended the following at today's visit.   DISCUSSION: We reviewed the characteristics, features and inheritance patterns of hereditary cancer syndromes. We reviewed that despite her sister's diagnosis of rectal cancer at age 45, Colleen Galloway does not meet Medicare criteria for genetic testing.We also discussed genetic testing, including the appropriate family members to test, the process of testing, insurance coverage and turn-around-time for results. We discussed with Colleen Galloway that the family history is not highly consistent with a familial hereditary Breast cancer syndrome, and we feel she is at low risk to harbor a gene mutation associated with such a condition. However, her  family may harbor a hereditary colon cancer syndrome based on the family history of colon cancer in her father and early onset of rectal cancer in her sister.  More specifically, Colleen Galloway's sister, Colleen Galloway, qualifies for genetic testing based on her age of rectal cancer and the family history of cancer.  Thus, we did not recommend any genetic testing, at this time, and recommended Colleen Galloway continue to follow the cancer screening guidelines given by her primary healthcare provider, but have offered genetic testing to Colleen Galloway, who came to the visit today.    PLAN: Based on Colleen Galloway's family history, we recommended her sister, Colleen Galloway, who was diagnosed with rectal cance at age 12, have genetic counseling and testing. Colleen Galloway will let us know if we can be of any assistance in coordinating genetic counseling and/or testing for this family member.   Lastly, we encouraged Ms. Eble to remain in contact with cancer genetics annually so that we can continuously update the family history and inform her of any changes in cancer genetics and testing that may be of benefit for this family.   Ms.  Galloway questions were answered to her satisfaction today. Our contact information was provided should additional  questions or concerns arise. Thank you for the referral and allowing [place] to share in the care of your patient.   Colleen Galloway P. Florene Glen, [place], [facility] Certified Genetic Counselor Santiago [email or web address] phone: [phone]  The patient was seen for a total of 45 minutes in face-to-face genetic counseling.  This patient was discussed with Drs. Magrinat, Lindi Adie and/or Burr Medico who agrees with the above.    _______________________________________________________________________ For Office Staff:  Number of people involved in session: 2 Was an Intern/ student involved with case: no

## 2014-03-25 ENCOUNTER — Telehealth: Payer: Self-pay | Admitting: Genetic Counselor

## 2014-03-25 ENCOUNTER — Telehealth: Payer: Self-pay | Admitting: Internal Medicine

## 2014-03-25 NOTE — Telephone Encounter (Signed)
New Msg      Gildardo Pounds form [facility] needs to know who signed the doc for Xarelto for the pt.   Also she needs a state license number for that provider that signed.   Please call at [phone] ext [phone].

## 2014-03-25 NOTE — Telephone Encounter (Signed)
lt mess regarding appt 04/07/14 at [place] Dx: Genetic testing Referring Dr. Hinton Rao (pls fax results to [phone]

## 2014-03-25 NOTE — Telephone Encounter (Signed)
I gave Santiago Glad license number requested and confirmed that Dr. Caryl Comes signed this form.

## 2014-04-07 ENCOUNTER — Other Ambulatory Visit: Payer: PPO

## 2014-04-07 ENCOUNTER — Encounter: Payer: PPO | Admitting: Genetic Counselor

## 2014-04-09 ENCOUNTER — Telehealth: Payer: Self-pay | Admitting: Internal Medicine

## 2014-04-09 NOTE — Telephone Encounter (Signed)
New Msg         Colleen Galloway calling, states her phn number has already been provided.    Please return call.

## 2014-04-09 NOTE — Telephone Encounter (Signed)
Spoke with Santiago Glad at [facility] and explained that I received a fax from [employer] about the request for medication assistance. Since Santiago Glad is handling this I will fax these papers to her.  Faxed to [phone].

## 2014-04-24 ENCOUNTER — Other Ambulatory Visit (HOSPITAL_COMMUNITY): Payer: PPO

## 2014-04-24 ENCOUNTER — Ambulatory Visit (HOSPITAL_COMMUNITY): Payer: PPO | Attending: Cardiology | Admitting: Radiology

## 2014-04-24 ENCOUNTER — Other Ambulatory Visit (HOSPITAL_COMMUNITY): Payer: Self-pay | Admitting: Radiology

## 2014-04-24 DIAGNOSIS — I482 Chronic atrial fibrillation, unspecified: Secondary | ICD-10-CM

## 2014-04-24 DIAGNOSIS — E785 Hyperlipidemia, unspecified: Secondary | ICD-10-CM | POA: Insufficient documentation

## 2014-04-24 DIAGNOSIS — I428 Other cardiomyopathies: Secondary | ICD-10-CM | POA: Diagnosis present

## 2014-04-24 DIAGNOSIS — I429 Cardiomyopathy, unspecified: Secondary | ICD-10-CM

## 2014-04-24 NOTE — Progress Notes (Signed)
Echocardiogram performed.  

## 2014-06-23 NOTE — Progress Notes (Signed)
Patient Care Team: Nicoletta Dress, MD as PCP - General (Internal Medicine) Deboraha Sprang, MD as Attending Physician (Cardiology) Nicholas Lose, MD as Consulting Physician (Hematology and Oncology)   HPI  Colleen Galloway is a 80 y.o. female Seen in followup for atrial fibrillation for which she is anticoagulated. She had problems with edema on calcium channel blockers and has been managed with beta blockers.  Dizziness thought to be orthostasis and was managed with  ProAmatine and isometrics  The patient was recently hospitalized from cancer surgery.  her margins were clear and her lymph nodes were negative.  Echocardiogram 3/16 was normal there was minimal left atrial enlargement and minimal  pulmonary artery pressure elevation--40   She is 12 months through her chemotherapy. She has 5 months to go.  She has had few palpitations.  Past Medical History  Diagnosis Date  . Atrial fibrillation   . Lumbosacral spondylosis without myelopathy   . Carotid artery disease     Followed by Dr. Scot Dock  . Insomnia, unspecified   . Esophageal reflux   . Fibrocystic disease of breast   . Osteoarthrosis, unspecified whether generalized or localized, unspecified site   . Urinary tract infection   . Arthritis   . Unspecified sinusitis (chronic)   . Orthostatic lightheadedness 10/20/2011  . Calculus of kidney     kidney stones  . Breast cancer 09/17/13    Left iNVASIVE DUCTAL,dcis  . Allergy   . PONV (postoperative nausea and vomiting)   . Dysrhythmia   . CHF (congestive heart failure)   . Family history of colon cancer   . Family history of rectal cancer     Past Surgical History  Procedure Laterality Date  . Dilation and curettage of uterus  1960  . Kidney stone surgery    . Varicose veins    . Breast lumpectomy with needle localization and axillary sentinel lymph node bx Left 10/18/2013    Procedure: LEFT AXILLARY LYMPHATIC MAPPING; INJECTION OF METHYLENE BLUE INTO LEFT  BREAST; LEFT BREAST PARTIAL MASTECTOMY AFTER NEEDLE LOCALIZATION; AXILLARY SENTINEL LYMPH NODE BIOPSY;  Surgeon: Jackolyn Confer, MD;  Location: [facility];  Service: General;  Laterality: Left;  . Portacath placement N/A 10/18/2013    Procedure: INSERTION PORT-A-CATH/ULTRASOUND GUIDED,RIGHT INTERNAL JUGULAR;  Surgeon: Jackolyn Confer, MD;  Location: [facility];  Service: General;  Laterality: N/A;    Current Outpatient Prescriptions  Medication Sig Dispense Refill  . acetaminophen (TYLENOL) 500 MG tablet Take 500 mg by mouth every 6 (six) hours as needed.    . calcium citrate-vitamin D (CITRACAL+D) 315-200 MG-UNIT per tablet Take 1 tablet by mouth daily.     . chlorthalidone (HYGROTON) 25 MG tablet Take 25 mg by mouth daily as needed (for fluid).     Marland Kitchen doxycycline (ADOXA) 50 MG tablet Take 50 mg by mouth daily as needed (for rosacea).     . lidocaine-prilocaine (EMLA) cream Apply 1 application topically as needed. 30 g 0  . meclizine (ANTIVERT) 25 MG tablet Take 1 tablet (25 mg total) by mouth 3 (three) times daily as needed for dizziness. 90 tablet 2  . meloxicam (MOBIC) 15 MG tablet Take 15 mg by mouth daily as needed for pain.     . metoprolol succinate (TOPROL-XL) 25 MG 24 hr tablet Take 1 tablet (25 mg total) by mouth daily. 90 tablet 1  . Multiple Vitamin (MULTIVITAMIN) capsule Take 1 capsule by mouth daily.      . Omega-3 Fatty  Acids (SALMON OIL-1000) 200 MG CAPS Take 1 capsule by mouth every evening.     Marland Kitchen omeprazole (PRILOSEC) 20 MG capsule Take 20 mg by mouth daily.      . rivaroxaban (XARELTO) 20 MG TABS tablet Take 1 tablet (20 mg total) by mouth daily with supper. 30 tablet 9  . traZODone (DESYREL) 50 MG tablet Take 50-100 mg by mouth daily as needed for sleep.     . Vaginal Lubricant (REPLENS) GEL Place 1 Applicatorful vaginally daily as needed (for dryness).     No current facility-administered medications for this visit.    Allergies  Allergen Reactions  . Diltiazem Hcl     Pt states  it causes her ankle swelling   . Flecainide Other (See Comments)    Sob, visual problems, swelling ankles  . Lopressor [Metoprolol Tartrate]     Severe dizziness. Irregular heartbeat   . Nebivolol Swelling    Sob, visual problems, swelling in ankles  . Sodium Pantothenate     swelling    Review of Systems negative except from HPI and PMH  Physical Exam BP 130/70 mmHg  Pulse 73  Ht 5\' 4"  (1.626 m)  Wt 170 lb (77.111 kg)  BMI 29.17 kg/m2 Well developed and well nourished in no acute distress HENT normal E scleral and icterus clear Neck Supple JVP flat; carotids brisk and full Clear to ausculation  Regular rate and rhythm, no murmurs gallops or rub Soft with active bowel sounds No clubbing cyanosis  Edema Alert and oriented, grossly normal motor and sensory function Skin Warm and Dry     Assessment and  Plan  Atrial fibrillation-paroxysmal  Hypertension  Malaise  Breast Cancer  the patient continues with paroxysms of atrial fibrillation. She remains on beta-blockade and anticoagulation. Blood pressure is reasonably controlled

## 2014-06-24 ENCOUNTER — Ambulatory Visit (INDEPENDENT_AMBULATORY_CARE_PROVIDER_SITE_OTHER): Payer: PPO | Admitting: Internal Medicine

## 2014-06-24 ENCOUNTER — Encounter: Payer: Self-pay | Admitting: Internal Medicine

## 2014-06-24 VITALS — BP 130/70 | HR 73 | Ht 64.0 in | Wt 170.0 lb

## 2014-06-24 DIAGNOSIS — I480 Paroxysmal atrial fibrillation: Secondary | ICD-10-CM | POA: Diagnosis not present

## 2014-06-24 NOTE — Patient Instructions (Signed)
Medication Instructions:  Your physician recommends that you continue on your current medications as directed. Please refer to the Current Medication list given to you today.   Labwork: none  Testing/Procedures: None  Follow-Up: Your physician wants you to follow-up in: 12 months with Dr. Gari Crown will receive a reminder letter in the mail two months in advance. If you don't receive a letter, please call our office to schedule the follow-up appointment.   Any Other Special Instructions Will Be Listed Below (If Applicable). None

## 2014-07-07 ENCOUNTER — Encounter: Payer: Self-pay | Admitting: Vascular Surgery

## 2014-07-08 ENCOUNTER — Other Ambulatory Visit: Payer: Self-pay | Admitting: Vascular Surgery

## 2014-07-08 DIAGNOSIS — I739 Peripheral vascular disease, unspecified: Principal | ICD-10-CM

## 2014-07-08 DIAGNOSIS — I779 Disorder of arteries and arterioles, unspecified: Secondary | ICD-10-CM

## 2014-07-09 ENCOUNTER — Ambulatory Visit (HOSPITAL_COMMUNITY)
Admission: RE | Admit: 2014-07-09 | Discharge: 2014-07-09 | Disposition: A | Discharge status: Home / Self Care | Payer: PPO | Source: Ambulatory Visit | Attending: Vascular Surgery | Admitting: Vascular Surgery

## 2014-07-09 ENCOUNTER — Encounter: Payer: Self-pay | Admitting: Vascular Surgery

## 2014-07-09 ENCOUNTER — Ambulatory Visit (INDEPENDENT_AMBULATORY_CARE_PROVIDER_SITE_OTHER): Payer: PPO | Admitting: Vascular Surgery

## 2014-07-09 VITALS — BP 138/68 | HR 64 | Ht 64.0 in | Wt 167.1 lb

## 2014-07-09 DIAGNOSIS — I6521 Occlusion and stenosis of right carotid artery: Secondary | ICD-10-CM

## 2014-07-09 DIAGNOSIS — I779 Disorder of arteries and arterioles, unspecified: Secondary | ICD-10-CM | POA: Diagnosis not present

## 2014-07-09 DIAGNOSIS — I6623 Occlusion and stenosis of bilateral posterior cerebral arteries: Secondary | ICD-10-CM | POA: Diagnosis not present

## 2014-07-09 DIAGNOSIS — I739 Peripheral vascular disease, unspecified: Secondary | ICD-10-CM

## 2014-07-09 NOTE — Progress Notes (Signed)
[facility]  Patient name: Colleen Galloway MRN: [id number] DOB: 08-29-1934 Sex: female  REASON FOR VISIT: Follow up of carotid disease  HPI: Colleen Galloway is a 80 y.o. female who I last saw on 07/03/2013. At that time she had a 40-59% right carotid stenosis with no significant stenosis on the left. She comes in for a 1 year follow up visit. Incised so her last, she denies any history of stroke, TIAs, expressive or receptive aphasia, or amaurosis fugax.  She is being treated for breast cancer. She has had a biopsy in the left breast and has a seroma. She has a Port-A-Cath for chemotherapy. She denies any arm swelling.  Past Medical History  Diagnosis Date  . Atrial fibrillation   . Lumbosacral spondylosis without myelopathy   . Carotid artery disease     Followed by Dr. Scot Dock  . Insomnia, unspecified   . Esophageal reflux   . Fibrocystic disease of breast   . Osteoarthrosis, unspecified whether generalized or localized, unspecified site   . Urinary tract infection   . Arthritis   . Unspecified sinusitis (chronic)   . Orthostatic lightheadedness 10/20/2011  . Calculus of kidney     kidney stones  . Breast cancer 09/17/13    Left iNVASIVE DUCTAL,dcis  . Allergy   . PONV (postoperative nausea and vomiting)   . Dysrhythmia   . CHF (congestive heart failure)   . Family history of colon cancer   . Family history of rectal cancer    Family History  Problem Relation Age of Onset  . Heart disease Mother   . Asthma Mother   . Hypertension Mother   . Deep vein thrombosis Mother   . Other Mother     varicose veins  . Lung cancer Mother 62    non-smoker  . Heart disease Father   . Hypertension Father   . Stroke Father   . Heart disease Sister   . Hypertension Sister   . Hyperlipidemia Sister   . Rectal cancer Sister 68  . Heart disease Brother   . Asthma Brother   . Hypertension Brother   . Deep vein thrombosis Brother   . Diabetes Brother     . Colon cancer Other 20    maternal cousin's son; NOS  . Colon cancer Maternal Grandfather 16  . Liver disease Brother 59    Cirrhosis due to infection from gallbladder  . Melanoma Other    SOCIAL HISTORY: History  Substance Use Topics  . Smoking status: Never Smoker   . Smokeless tobacco: Never Used  . Alcohol Use: No   Allergies  Allergen Reactions  . Diltiazem Hcl     Pt states it causes her ankle swelling   . Flecainide Other (See Comments)    Sob, visual problems, swelling ankles  . Lopressor [Metoprolol Tartrate]     Severe dizziness. Irregular heartbeat   . Nebivolol Swelling    Sob, visual problems, swelling in ankles  . Sodium Pantothenate     swelling   Current Outpatient Prescriptions  Medication Sig Dispense Refill  . acetaminophen (TYLENOL) 500 MG tablet Take 500 mg by mouth every 6 (six) hours as needed.    . calcium citrate-vitamin D (CITRACAL+D) 315-200 MG-UNIT per tablet Take 1 tablet by mouth daily.     . chlorthalidone (HYGROTON) 25 MG tablet Take 25 mg by mouth daily as needed (for fluid).     Marland Kitchen doxycycline (ADOXA) 50 MG tablet Take  50 mg by mouth daily as needed (for rosacea).     . lidocaine-prilocaine (EMLA) cream Apply 1 application topically as needed. 30 g 0  . meclizine (ANTIVERT) 25 MG tablet Take 1 tablet (25 mg total) by mouth 3 (three) times daily as needed for dizziness. 90 tablet 2  . meloxicam (MOBIC) 15 MG tablet Take 15 mg by mouth daily as needed for pain.     . metoprolol succinate (TOPROL-XL) 25 MG 24 hr tablet Take 1 tablet (25 mg total) by mouth daily. 90 tablet 1  . Multiple Vitamin (MULTIVITAMIN) capsule Take 1 capsule by mouth daily.      . Omega-3 Fatty Acids (SALMON OIL-1000) 200 MG CAPS Take 1 capsule by mouth every evening.     Marland Kitchen omeprazole (PRILOSEC) 20 MG capsule Take 20 mg by mouth daily.      . rivaroxaban (XARELTO) 20 MG TABS tablet Take 1 tablet (20 mg total) by mouth daily with supper. 30 tablet 9  . traZODone  (DESYREL) 50 MG tablet Take 50-100 mg by mouth daily as needed for sleep.     . Vaginal Lubricant (REPLENS) GEL Place 1 Applicatorful vaginally daily as needed (for dryness).     No current facility-administered medications for this visit.   REVIEW OF SYSTEMS: Valu.Nieves ] denotes positive finding; [  ] denotes negative finding  CARDIOVASCULAR:  [ ]  chest pain   [ ]  chest pressure   [ ]  palpitations   [ ]  orthopnea   [ ]  dyspnea on exertion   [ ]  claudication   [ ]  rest pain   [ ]  DVT   [ ]  phlebitis PULMONARY:   [ ]  productive cough   [ ]  asthma   [ ]  wheezing NEUROLOGIC:   [ ]  weakness  [ ]  paresthesias  [ ]  aphasia  [ ]  amaurosis  [ ]  dizziness HEMATOLOGIC:   [ ]  bleeding problems   [ ]  clotting disorders MUSCULOSKELETAL:  [ ]  joint pain   [ ]  joint swelling [ ]  leg swelling GASTROINTESTINAL: [ ]   blood in stool  [ ]   hematemesis GENITOURINARY:  [ ]   dysuria  [ ]   hematuria PSYCHIATRIC:  [ ]  history of major depression INTEGUMENTARY:  [ ]  rashes  [ ]  ulcers CONSTITUTIONAL:  [ ]  fever   [ ]  chills  PHYSICAL EXAM: Filed Vitals:   07/09/14 1347  BP: 138/68  Pulse: 64  Height: 5\' 4"  (1.626 m)  Weight: 167 lb 1.6 oz (75.796 kg)  SpO2: 100%   GENERAL: The patient is a well-nourished female, in no acute distress. The vital signs are documented above. CARDIOVASCULAR: There is a regular rate and rhythm. I do not that carotid bruits. She has no significant lower extremity swelling. PULMONARY: There is good air exchange bilaterally without wheezing or rales. ABDOMEN: Soft and non-tender with normal pitched bowel sounds.  MUSCULOSKELETAL: There are no major deformities or cyanosis. NEUROLOGIC: No focal weakness or paresthesias are detected. SKIN: There are no ulcers or rashes noted. PSYCHIATRIC: The patient has a normal affect.  DATA:  I have independently interpreted her carotid duplex scan today. There is a 50-69% right carotid stenosis and a less than 49% left carotid stenosis. There is  nonocclusive thrombus noted in the right IJ adjacent to her central venous catheter.  MEDICAL ISSUES:  ASYMPTOMATIC 50-69% RIGHT CAROTID STENOSIS: She understands we would not consider right carotid endarterectomy unless the stenosis progressed to greater than 80% or she develops new neurologic symptoms.  I've ordered a follow up carotid duplex scan in 1 year and I'll see her back at that time. She knows to call sooner if she has problems. She is not a smoker. She is not on aspirin as she takes Nutritional therapist. She is on a statin.  NONOCCLUSIVE THROMBUS ADJACENT TO PORT-A-CATH LINE IN RIGHT INTERNAL JUGULAR VEIN: Given that the catheter is still being used, I have explained that the standard of care would be to continue to use the catheter as long as necessary and then I believe she is to have this removed after she has completed her chemotherapy. She is on Xeralto.   Return in about 1 year (around 07/09/2015).   Deitra Mayo [facility] [place]: [phone]

## 2014-07-09 NOTE — Addendum Note (Signed)
Addended by: Dorthula Rue L on: 07/09/2014 02:51 PM   Modules accepted: Orders

## 2014-07-10 ENCOUNTER — Encounter: Payer: Self-pay | Admitting: Internal Medicine

## 2014-07-17 ENCOUNTER — Other Ambulatory Visit: Payer: Self-pay

## 2014-07-17 ENCOUNTER — Ambulatory Visit (HOSPITAL_COMMUNITY): Payer: PPO | Attending: Cardiovascular Disease

## 2014-07-17 ENCOUNTER — Other Ambulatory Visit (HOSPITAL_COMMUNITY): Payer: Self-pay | Admitting: Radiology

## 2014-07-17 DIAGNOSIS — I071 Rheumatic tricuspid insufficiency: Secondary | ICD-10-CM | POA: Diagnosis not present

## 2014-07-17 DIAGNOSIS — I517 Cardiomegaly: Secondary | ICD-10-CM | POA: Diagnosis not present

## 2014-07-17 DIAGNOSIS — I340 Nonrheumatic mitral (valve) insufficiency: Secondary | ICD-10-CM | POA: Diagnosis not present

## 2014-07-17 DIAGNOSIS — I429 Cardiomyopathy, unspecified: Secondary | ICD-10-CM

## 2014-07-18 ENCOUNTER — Encounter (HOSPITAL_COMMUNITY): Payer: Self-pay | Admitting: Internal Medicine

## 2014-08-20 ENCOUNTER — Telehealth: Payer: Self-pay | Admitting: Internal Medicine

## 2014-08-20 NOTE — Telephone Encounter (Signed)
New message       Request for surgical clearance:  What type of surgery is being performed? Stent place in eye to open tear duct When is this surgery scheduled? 08-22-14 1. Are there any medications that need to be held prior to surgery and how long? Stop xarelto  2. Name of physician performing surgery? Dr Shelbie Proctor  3. What is your office phone and fax number? Fax [phone]-------they are faxing you a form also

## 2014-08-20 NOTE — Telephone Encounter (Signed)
Advised that Dr. Caryl Comes will review on Friday when he returns to the office.

## 2014-08-25 NOTE — Telephone Encounter (Signed)
Faxed clearance to stop Xarelto 48 hours prior to DLR w/ stent left eye.   Faxed to [facility] at [phone]

## 2014-10-06 ENCOUNTER — Other Ambulatory Visit: Payer: Self-pay | Admitting: Oncology

## 2014-10-06 DIAGNOSIS — I429 Cardiomyopathy, unspecified: Secondary | ICD-10-CM

## 2014-10-09 ENCOUNTER — Ambulatory Visit (HOSPITAL_COMMUNITY): Payer: PPO | Attending: Cardiology

## 2014-10-09 ENCOUNTER — Other Ambulatory Visit: Payer: Self-pay

## 2014-10-09 DIAGNOSIS — I6529 Occlusion and stenosis of unspecified carotid artery: Secondary | ICD-10-CM | POA: Diagnosis not present

## 2014-10-09 DIAGNOSIS — I340 Nonrheumatic mitral (valve) insufficiency: Secondary | ICD-10-CM | POA: Diagnosis not present

## 2014-10-09 DIAGNOSIS — I429 Cardiomyopathy, unspecified: Secondary | ICD-10-CM

## 2014-10-09 DIAGNOSIS — I371 Nonrheumatic pulmonary valve insufficiency: Secondary | ICD-10-CM | POA: Insufficient documentation

## 2014-10-09 DIAGNOSIS — E785 Hyperlipidemia, unspecified: Secondary | ICD-10-CM | POA: Diagnosis not present

## 2014-10-09 DIAGNOSIS — I517 Cardiomegaly: Secondary | ICD-10-CM | POA: Insufficient documentation

## 2014-10-09 DIAGNOSIS — I5189 Other ill-defined heart diseases: Secondary | ICD-10-CM | POA: Diagnosis not present

## 2014-10-09 DIAGNOSIS — C50919 Malignant neoplasm of unspecified site of unspecified female breast: Secondary | ICD-10-CM | POA: Insufficient documentation

## 2014-10-09 DIAGNOSIS — I071 Rheumatic tricuspid insufficiency: Secondary | ICD-10-CM | POA: Insufficient documentation

## 2014-11-26 ENCOUNTER — Other Ambulatory Visit: Payer: Self-pay | Admitting: General Surgery

## 2014-12-02 ENCOUNTER — Other Ambulatory Visit: Payer: Self-pay | Admitting: General Surgery

## 2014-12-23 ENCOUNTER — Encounter (HOSPITAL_BASED_OUTPATIENT_CLINIC_OR_DEPARTMENT_OTHER): Payer: Self-pay | Admitting: *Deleted

## 2014-12-24 ENCOUNTER — Encounter (HOSPITAL_BASED_OUTPATIENT_CLINIC_OR_DEPARTMENT_OTHER)
Admission: RE | Admit: 2014-12-24 | Discharge: 2014-12-24 | Disposition: A | Discharge status: Home / Self Care | Payer: PPO | Source: Ambulatory Visit | Attending: General Surgery | Admitting: General Surgery

## 2014-12-24 DIAGNOSIS — K219 Gastro-esophageal reflux disease without esophagitis: Secondary | ICD-10-CM | POA: Diagnosis not present

## 2014-12-24 DIAGNOSIS — Z7901 Long term (current) use of anticoagulants: Secondary | ICD-10-CM | POA: Diagnosis not present

## 2014-12-24 DIAGNOSIS — I739 Peripheral vascular disease, unspecified: Secondary | ICD-10-CM | POA: Diagnosis not present

## 2014-12-24 DIAGNOSIS — M1990 Unspecified osteoarthritis, unspecified site: Secondary | ICD-10-CM | POA: Diagnosis not present

## 2014-12-24 DIAGNOSIS — I509 Heart failure, unspecified: Secondary | ICD-10-CM | POA: Diagnosis not present

## 2014-12-24 DIAGNOSIS — Z792 Long term (current) use of antibiotics: Secondary | ICD-10-CM | POA: Diagnosis not present

## 2014-12-24 DIAGNOSIS — Z79899 Other long term (current) drug therapy: Secondary | ICD-10-CM | POA: Diagnosis not present

## 2014-12-24 DIAGNOSIS — I110 Hypertensive heart disease with heart failure: Secondary | ICD-10-CM | POA: Diagnosis not present

## 2014-12-24 DIAGNOSIS — Z791 Long term (current) use of non-steroidal anti-inflammatories (NSAID): Secondary | ICD-10-CM | POA: Diagnosis not present

## 2014-12-24 DIAGNOSIS — C50919 Malignant neoplasm of unspecified site of unspecified female breast: Secondary | ICD-10-CM | POA: Diagnosis not present

## 2014-12-24 DIAGNOSIS — Z452 Encounter for adjustment and management of vascular access device: Secondary | ICD-10-CM | POA: Diagnosis present

## 2014-12-24 LAB — CBC WITH DIFFERENTIAL/PLATELET
BASOS PCT: 1 %
Basophils Absolute: 0 10*3/uL (ref 0.0–0.1)
Eosinophils Absolute: 0.1 10*3/uL (ref 0.0–0.7)
Eosinophils Relative: 3 %
HEMATOCRIT: 38 % (ref 36.0–46.0)
HEMOGLOBIN: 12.6 g/dL (ref 12.0–15.0)
Lymphocytes Relative: 30 %
Lymphs Abs: 1.2 10*3/uL (ref 0.7–4.0)
MCH: 30.1 pg (ref 26.0–34.0)
MCHC: 33.2 g/dL (ref 30.0–36.0)
MCV: 90.7 fL (ref 78.0–100.0)
MONOS PCT: 7 %
Monocytes Absolute: 0.3 10*3/uL (ref 0.1–1.0)
NEUTROS ABS: 2.3 10*3/uL (ref 1.7–7.7)
NEUTROS PCT: 59 %
Platelets: 165 10*3/uL (ref 150–400)
RBC: 4.19 MIL/uL (ref 3.87–5.11)
RDW: 13.5 % (ref 11.5–15.5)
WBC: 3.9 10*3/uL — ABNORMAL LOW (ref 4.0–10.5)

## 2014-12-24 LAB — BASIC METABOLIC PANEL
ANION GAP: 8 (ref 5–15)
BUN: 15 mg/dL (ref 6–20)
CALCIUM: 9.6 mg/dL (ref 8.9–10.3)
CHLORIDE: 105 mmol/L (ref 101–111)
CO2: 27 mmol/L (ref 22–32)
Creatinine, Ser: 0.8 mg/dL (ref 0.44–1.00)
GFR calc Af Amer: >60 mL/min (ref >60)
GFR calc non Af Amer: >60 mL/min (ref >60)
Glucose, Bld: 96 mg/dL (ref 65–99)
Potassium: 4.8 mmol/L (ref 3.5–5.1)
Sodium: 140 mmol/L (ref 135–145)

## 2014-12-24 LAB — PROTIME-INR
INR: 1 (ref 0.00–1.49)
PROTHROMBIN TIME: 13.4 s (ref 11.6–15.2)

## 2014-12-24 NOTE — Progress Notes (Signed)
Dr. Myrtie Soman Reviewed EKG and previous EKG 10/2013 - ok for surgery

## 2014-12-26 ENCOUNTER — Ambulatory Visit (HOSPITAL_BASED_OUTPATIENT_CLINIC_OR_DEPARTMENT_OTHER): Payer: PPO | Admitting: Certified Registered"

## 2014-12-26 ENCOUNTER — Encounter (HOSPITAL_BASED_OUTPATIENT_CLINIC_OR_DEPARTMENT_OTHER)
Admission: RE | Disposition: A | Discharge status: Home / Self Care | Payer: Self-pay | Source: Ambulatory Visit | Attending: General Surgery

## 2014-12-26 ENCOUNTER — Encounter (HOSPITAL_BASED_OUTPATIENT_CLINIC_OR_DEPARTMENT_OTHER): Payer: Self-pay

## 2014-12-26 ENCOUNTER — Ambulatory Visit (HOSPITAL_BASED_OUTPATIENT_CLINIC_OR_DEPARTMENT_OTHER)
Admission: RE | Admit: 2014-12-26 | Discharge: 2014-12-26 | Disposition: A | Discharge status: Home / Self Care | Payer: PPO | Source: Ambulatory Visit | Attending: General Surgery | Admitting: General Surgery

## 2014-12-26 DIAGNOSIS — Z792 Long term (current) use of antibiotics: Secondary | ICD-10-CM | POA: Diagnosis not present

## 2014-12-26 DIAGNOSIS — M1990 Unspecified osteoarthritis, unspecified site: Secondary | ICD-10-CM | POA: Insufficient documentation

## 2014-12-26 DIAGNOSIS — I509 Heart failure, unspecified: Secondary | ICD-10-CM | POA: Insufficient documentation

## 2014-12-26 DIAGNOSIS — Z79899 Other long term (current) drug therapy: Secondary | ICD-10-CM | POA: Diagnosis not present

## 2014-12-26 DIAGNOSIS — K219 Gastro-esophageal reflux disease without esophagitis: Secondary | ICD-10-CM | POA: Insufficient documentation

## 2014-12-26 DIAGNOSIS — I739 Peripheral vascular disease, unspecified: Secondary | ICD-10-CM | POA: Insufficient documentation

## 2014-12-26 DIAGNOSIS — Z452 Encounter for adjustment and management of vascular access device: Secondary | ICD-10-CM | POA: Diagnosis not present

## 2014-12-26 DIAGNOSIS — C50919 Malignant neoplasm of unspecified site of unspecified female breast: Secondary | ICD-10-CM | POA: Insufficient documentation

## 2014-12-26 DIAGNOSIS — Z791 Long term (current) use of non-steroidal anti-inflammatories (NSAID): Secondary | ICD-10-CM | POA: Insufficient documentation

## 2014-12-26 DIAGNOSIS — I110 Hypertensive heart disease with heart failure: Secondary | ICD-10-CM | POA: Insufficient documentation

## 2014-12-26 DIAGNOSIS — Z7901 Long term (current) use of anticoagulants: Secondary | ICD-10-CM | POA: Diagnosis not present

## 2014-12-26 HISTORY — PX: PORT-A-CATH REMOVAL: SHX5289

## 2014-12-26 SURGERY — REMOVAL PORT-A-CATH
Anesthesia: Monitor Anesthesia Care | Site: Breast | Laterality: Right

## 2014-12-26 MED ORDER — ONDANSETRON HCL 4 MG/2ML IJ SOLN
INTRAMUSCULAR | Status: DC | PRN
  Administered 2014-12-26: 4 mg via INTRAVENOUS

## 2014-12-26 MED ORDER — BUPIVACAINE HCL (PF) 0.5 % IJ SOLN
INTRAMUSCULAR | Status: DC | PRN
  Administered 2014-12-26: 5 mL

## 2014-12-26 MED ORDER — CEFAZOLIN SODIUM-DEXTROSE 2-3 GM-% IV SOLR
INTRAVENOUS | Status: DC | PRN
  Administered 2014-12-26: 2 g via INTRAVENOUS

## 2014-12-26 MED ORDER — LIDOCAINE HCL (CARDIAC) 20 MG/ML IV SOLN
INTRAVENOUS | Status: DC | PRN
  Administered 2014-12-26: 30 mg via INTRAVENOUS

## 2014-12-26 MED ORDER — LIDOCAINE HCL 1 % IJ SOLN
INTRAMUSCULAR | Status: DC | PRN
  Administered 2014-12-26: 5 mL

## 2014-12-26 MED ORDER — LACTATED RINGERS IV SOLN
INTRAVENOUS | Status: DC | PRN
  Administered 2014-12-26: 11:00:00 via INTRAVENOUS

## 2014-12-26 MED ORDER — PROPOFOL 10 MG/ML IV BOLUS
INTRAVENOUS | Status: DC | PRN
  Administered 2014-12-26 (×3): 10 mg via INTRAVENOUS
  Administered 2014-12-26: 20 mg via INTRAVENOUS
  Administered 2014-12-26 (×2): 10 mg via INTRAVENOUS

## 2014-12-26 SURGICAL SUPPLY — 40 items
APL SKNCLS STERI-STRIP NONHPOA (GAUZE/BANDAGES/DRESSINGS) ×1
BENZOIN TINCTURE PRP APPL 2/3 (GAUZE/BANDAGES/DRESSINGS) ×3 IMPLANT
BLADE SURG 15 STRL LF DISP TIS (BLADE) ×1 IMPLANT
BLADE SURG 15 STRL SS (BLADE) ×3
CHLORAPREP W/TINT 26ML (MISCELLANEOUS) ×3 IMPLANT
CLEANER CAUTERY TIP 5X5 PAD (MISCELLANEOUS) ×1 IMPLANT
CLOSURE WOUND 1/2 X4 (GAUZE/BANDAGES/DRESSINGS) ×1
COVER BACK TABLE 60X90IN (DRAPES) ×3 IMPLANT
COVER MAYO STAND STRL (DRAPES) ×3 IMPLANT
DECANTER SPIKE VIAL GLASS SM (MISCELLANEOUS) ×3 IMPLANT
DRAPE LAPAROTOMY 100X72 PEDS (DRAPES) ×3 IMPLANT
DRAPE UTILITY XL STRL (DRAPES) IMPLANT
DRSG TEGADERM 2-3/8X2-3/4 SM (GAUZE/BANDAGES/DRESSINGS) ×2 IMPLANT
DRSG TELFA 3X8 NADH (GAUZE/BANDAGES/DRESSINGS) IMPLANT
ELECT REM PT RETURN 9FT ADLT (ELECTROSURGICAL) ×3
ELECTRODE REM PT RTRN 9FT ADLT (ELECTROSURGICAL) ×1 IMPLANT
GLOVE BIOGEL PI IND STRL 7.5 (GLOVE) IMPLANT
GLOVE BIOGEL PI IND STRL 8.5 (GLOVE) ×1 IMPLANT
GLOVE BIOGEL PI INDICATOR 7.5 (GLOVE) ×2
GLOVE BIOGEL PI INDICATOR 8.5 (GLOVE) ×2
GLOVE ECLIPSE 7.5 STRL STRAW (GLOVE) ×2 IMPLANT
GLOVE ECLIPSE 8.0 STRL XLNG CF (GLOVE) ×3 IMPLANT
GOWN STRL REUS W/ TWL LRG LVL3 (GOWN DISPOSABLE) ×1 IMPLANT
GOWN STRL REUS W/TWL LRG LVL3 (GOWN DISPOSABLE) ×3
NDL HYPO 25X1 1.5 SAFETY (NEEDLE) ×1 IMPLANT
NEEDLE HYPO 25X1 1.5 SAFETY (NEEDLE) ×3 IMPLANT
PACK BASIN DAY SURGERY FS (CUSTOM PROCEDURE TRAY) ×3 IMPLANT
PAD CLEANER CAUTERY TIP 5X5 (MISCELLANEOUS) ×2
PAD DRESSING TELFA 3X8 NADH (GAUZE/BANDAGES/DRESSINGS) IMPLANT
PENCIL BUTTON HOLSTER BLD 10FT (ELECTRODE) ×3 IMPLANT
SLEEVE SCD COMPRESS KNEE MED (MISCELLANEOUS) ×2 IMPLANT
SPONGE GAUZE 2X2 8PLY STER LF (GAUZE/BANDAGES/DRESSINGS) ×1
SPONGE GAUZE 2X2 8PLY STRL LF (GAUZE/BANDAGES/DRESSINGS) ×2 IMPLANT
STRIP CLOSURE SKIN 1/2X4 (GAUZE/BANDAGES/DRESSINGS) ×2 IMPLANT
SUT MON AB 4-0 PC3 18 (SUTURE) ×3 IMPLANT
SUT VIC AB 4-0 SH 27 (SUTURE) ×3
SUT VIC AB 4-0 SH 27XANBCTRL (SUTURE) ×1 IMPLANT
SYR CONTROL 10ML LL (SYRINGE) ×3 IMPLANT
TOWEL OR 17X24 6PK STRL BLUE (TOWEL DISPOSABLE) ×6 IMPLANT
TOWEL OR NON WOVEN STRL DISP B (DISPOSABLE) ×3 IMPLANT

## 2014-12-26 NOTE — Op Note (Signed)
OPERATIVE NOTE- PORT-A-CATH REMOVAL  PREOPERATIVE DIAGNOSIS:  Retain Port-a-cath.  POSTOPERATIVE DIAGNOSIS:  Same  PROCEDURE:    Porta-cath removal  SURGEON:  Jackolyn Confer, M.D.  ANESTHESIA:  Local with MAC  INDICATION:  This is a 80 year old female with breast cancer who has received Herceptin via the Porta-cath.  The Porta-cath is no longer needed.  She now presents for removal.  The procedure, risks, and aftercare have been explained preoperatively.   The right upper chest wall and neck were sterilely prepped and draped. Local anesthetic was infiltrated at the site of the port in the chest wall superficially and deep. The previous scar was incised sharply and then using electrocautery the subcutaneous tissue was divided until the port and catheter were identified.  The fibrous sheath was dissected free from the catheter and it was pulled out of the internal jugular vein. Direct pressure was held over the vein site for 10-15 minutes.  The port was then dissected free from the chest wall using electrocautery. The port and catheter were removed intact. The chest wall area was inspected and bleeding controlled with electrocautery. Once hemostasis was adequate, the chest wall incision was closed in 2 layers. The subcutaneous tissues approximated with running 3-0 Vicryl suture. The skin was closed with a running 4-0 Monocryl subcuticular stitch. Steri-Strips and a sterile dressing were applied.  She tolerated the procedure well without any apparent complications and was taken to the recovery room in satisfactory condition.

## 2014-12-26 NOTE — Anesthesia Procedure Notes (Signed)
Procedure Name: MAC Date/Time: 12/26/2014 11:05 AM Performed by: Keeara Frees D Pre-anesthesia Checklist: Patient identified, Emergency Drugs available, Suction available, Patient being monitored and Timeout performed Patient Re-evaluated:Patient Re-evaluated prior to inductionOxygen Delivery Method: Simple face mask

## 2014-12-26 NOTE — H&P (Signed)
Colleen L. Colleen Galloway Location: [facility] Patient #: [id number] DOB: 08/22/1934 Married / Language: English / Race: White Female   History of Present Illness Colleen Hollingshead MD; 12/02/2014 2:49 PM) The patient is a 80 year old female.  Note:She has completed her Herceptin therapy. Dr. Hinton Rao feels she could have her Port-A-Cath removed.  Allergies Colleen Galloway, [place]; 12/02/2014 2:30 PM) Diltiazem HCl *CHEMICALS* Flecainide Acetate *ANTIARRHYTHMICS* Lopressor HCT *ANTIHYPERTENSIVES* Diltiazem HCl ER *CALCIUM CHANNEL BLOCKERS* Lopressor *BETA BLOCKERS* Sodium Phosphate Tribasic *CHEMICALS*  Medication History Colleen Galloway, CMA; 12/02/2014 2:32 PM) Anastrozole (1MG  Tablet, Oral) Active. Metoprolol Succinate ER (25MG  Tablet ER 24HR, Oral) Active. Doxycycline Hyclate (50MG  Capsule, Oral) Active. TraZODone HCl (50MG  Tablet, Oral) Active. Xarelto (20MG  Tablet, Oral) Active. Simvastatin (40MG  Tablet, Oral) Active. Calcium Citrate + D (315-200MG -UNIT Tablet, Oral daily) Active. Hygroton (25MG  Tablet, Oral daily) Active. Adoxa (50MG  Tablet, Oral prn) Active. Antivert (25MG  Tablet, Oral daily) Active. Mobic (15MG  Tablet, Oral prn) Active. Toprol XL (25MG  Tablet ER 24HR, Oral daily) Active. Multivitamin (Oral daily) Active. Opcon-A (0.027-0.315% Solution, Ophthalmic prn) Active. PriLOSEC (20MG  Capsule DR, Oral prn) Active. Medications Reconciled Thalitone (25MG  Tablet, Oral) Active.  Vitals Colleen Galloway CMA; 12/02/2014 2:31 PM) 12/02/2014 2:31 PM Weight: 175.6 lb Height: 64in Body Surface Area: 1.85 m Body Mass Index: 30.14 kg/m  Temp.: 31F(Temporal)  Pulse: 77 (Regular)  BP: 126/72 (Sitting, Left Arm, Standard)       Physical Exam Colleen Hollingshead MD; 12/02/2014 2:49 PM) The physical exam findings are as follows: Note:Right upper chest wall demonstrates a small scar with the underlying Port-A-Cath  palpable.    Assessment & Plan Colleen Hollingshead MD; 12/02/2014 2:50 PM) MALIGNANT NEOPLASM OF UPPER-INNER QUADRANT OF LEFT FEMALE BREAST (C50.212) PORT-A-CATH IN PLACE [id number]) Impression: Plan Port-A-Cath removal as outpatient. We'll ask her to stop her Xarelto 3 days prior to the procedure.

## 2014-12-26 NOTE — Anesthesia Postprocedure Evaluation (Signed)
  Anesthesia Post-op Note  Patient: Colleen Galloway  Procedure(s) Performed: Procedure(s): REMOVAL PORT-A-CATH (Right)  Patient Location: PACU  Anesthesia Type: MAC   Level of Consciousness: awake, alert  and oriented  Airway and Oxygen Therapy: Patient Spontanous Breathing  Post-op Pain: mild  Post-op Assessment: Post-op Vital signs reviewed  Post-op Vital Signs: Reviewed  Last Vitals:  Filed Vitals:   12/26/14 1228  BP: 145/72  Pulse: 65  Temp: 36.7 C  Resp: 16    Complications: No apparent anesthesia complications

## 2014-12-26 NOTE — Transfer of Care (Signed)
Immediate Anesthesia Transfer of Care Note  Patient: Colleen Galloway  Procedure(s) Performed: Procedure(s): REMOVAL PORT-A-CATH (Right)  Patient Location: PACU  Anesthesia Type:MAC  Level of Consciousness: awake, alert , oriented and patient cooperative  Airway & Oxygen Therapy: Patient Spontanous Breathing and Patient connected to face mask oxygen  Post-op Assessment: Report given to RN and Post -op Vital signs reviewed and stable  Post vital signs: Reviewed and stable  Last Vitals:  Filed Vitals:   12/26/14 1020  BP: 146/73  Pulse: 76  Temp: 36.4 C  Resp: 18    Complications: No apparent anesthesia complications

## 2014-12-26 NOTE — Interval H&P Note (Signed)
History and Physical Interval Note:  12/26/2014 11:03 AM  Colleen Galloway  has presented today for surgery, with the diagnosis of Port-a-cath in place  The various methods of treatment have been discussed with the patient and family. After consideration of risks, benefits and other options for treatment, the patient has consented to  Procedure(s): REMOVAL PORT-A-CATH (N/A) as a surgical intervention .  The patient's history has been reviewed, patient examined, no change in status, stable for surgery.  I have reviewed the patient's chart and labs.  Questions were answered to the patient's satisfaction.     Caragh Gasper Lenna Sciara

## 2014-12-26 NOTE — Discharge Instructions (Addendum)
Light activities for the next 3 days, then may resume normal activities if you are pain-free.  Sit upright for the first 4-6 hours today.  Apply ice to the area today and tomorrow.  May shower beginning tomorrow.  Remove bandage in 3 days.  Leave steri strips on until they fall off.  Call for heavy bleeding, wound problems, or other questions related to your surgery.  Take the pain medication you already have as needed.  Restart Xarelto 2 days after surgery.  Office visit in 3 months for wound check and breast check.  Please call and make this appointment. Post Anesthesia Home Care Instructions  Activity: Get plenty of rest for the remainder of the day. A responsible adult should stay with you for 24 hours following the procedure.  For the next 24 hours, DO NOT: -Drive a car -Paediatric nurse -Drink alcoholic beverages -Take any medication unless instructed by your physician -Make any legal decisions or sign important papers.  Meals: Start with liquid foods such as gelatin or soup. Progress to regular foods as tolerated. Avoid greasy, spicy, heavy foods. If nausea and/or vomiting occur, drink only clear liquids until the nausea and/or vomiting subsides. Call your physician if vomiting continues.  Special Instructions/Symptoms: Your throat may feel dry or sore from the anesthesia or the breathing tube placed in your throat during surgery. If this causes discomfort, gargle with warm salt water. The discomfort should disappear within 24 hours.  If you had a scopolamine patch placed behind your ear for the management of post- operative nausea and/or vomiting:  1. The medication in the patch is effective for 72 hours, after which it should be removed.  Wrap patch in a tissue and discard in the trash. Wash hands thoroughly with soap and water. 2. You may remove the patch earlier than 72 hours if you experience unpleasant side effects which may include dry mouth, dizziness or visual  disturbances. 3. Avoid touching the patch. Wash your hands with soap and water after contact with the patch.

## 2014-12-26 NOTE — Interval H&P Note (Signed)
History and Physical Interval Note:  12/26/2014 10:56 AM  Colleen Galloway  has presented today for surgery, with the diagnosis of Port-a-cath in place  The various methods of treatment have been discussed with the patient and family. After consideration of risks, benefits and other options for treatment, the patient has consented to  Procedure(s): REMOVAL PORT-A-CATH (N/A) as a surgical intervention .  The patient's history has been reviewed, patient examined, no change in status, stable for surgery.  I have reviewed the patient's chart and labs.  Questions were answered to the patient's satisfaction.     [name]

## 2014-12-26 NOTE — Anesthesia Preprocedure Evaluation (Addendum)
Anesthesia Evaluation  Patient identified by MRN, date of birth, ID band Patient awake    Reviewed: Allergy & Precautions, NPO status   History of Anesthesia Complications (+) PONV and history of anesthetic complications  Airway Mallampati: I  TM Distance: >3 FB Neck ROM: Full    Dental  (+) Dental Advisory Given, Teeth Intact,    Pulmonary    breath sounds clear to auscultation       Cardiovascular hypertension, Pt. on home beta blockers + Peripheral Vascular Disease and +CHF  + dysrhythmias  Rhythm:Regular Rate:Normal     Neuro/Psych    GI/Hepatic GERD  Medicated,  Endo/Other    Renal/GU      Musculoskeletal  (+) Arthritis ,   Abdominal   Peds  Hematology   Anesthesia Other Findings   Reproductive/Obstetrics                           Anesthesia Physical Anesthesia Plan  ASA: III  Anesthesia Plan: MAC   Post-op Pain Management:    Induction: Intravenous  Airway Management Planned: Simple Face Mask  Additional Equipment:   Intra-op Plan:   Post-operative Plan:   Informed Consent: I have reviewed the patients History and Physical, chart, labs and discussed the procedure including the risks, benefits and alternatives for the proposed anesthesia with the patient or authorized representative who has indicated his/her understanding and acceptance.   Dental advisory given  Plan Discussed with: CRNA, Anesthesiologist and Surgeon  Anesthesia Plan Comments:         Anesthesia Quick Evaluation

## 2014-12-29 ENCOUNTER — Encounter (HOSPITAL_BASED_OUTPATIENT_CLINIC_OR_DEPARTMENT_OTHER): Payer: Self-pay | Admitting: General Surgery

## 2015-01-16 ENCOUNTER — Telehealth: Payer: Self-pay | Admitting: Internal Medicine

## 2015-01-16 MED ORDER — RIVAROXABAN 20 MG PO TABS: 20.0000 mg | ORAL_TABLET | Freq: Every day | ORAL | Status: DC

## 2015-01-16 NOTE — Telephone Encounter (Signed)
I called and spoke with Kennyth Lose at [facility]. The patient reports she is taking Xarelto and they need to know if this can be stopped prior to Urologic procedure. Xarelto is not on the patient's medication list- discontinued on 12/26/14- most likely inadvertantly when the patient had a procedure on that day. I left a message for the patient to call and confirm she is taking Xarelto 20 mg once daily.

## 2015-01-16 NOTE — Telephone Encounter (Signed)
I spoke with the patient. She confirms she is on Xarelto 20 mg, however due to being in the donut hole, she has been taking Xarelto 20 mg once every day alternating with ASA 81 mg once every other day. Her Xarelto cost $165/ month. She started alternating Xarelto and ASA last month. We discussed her increased risk for stroke in taking her Xarelto this way. I inquired if she can feel her heart going in/ out of rhythm and she states she can tell, but it doesn't seem to happen often. She will be out of Xarelto soon. I advised this I will refill for her at [facility].  She states the urologist would like her to stop her ASA/ Xarelto on 12/10. She has a kidney stone requiring lithotripsy. Will review with Dr. Caryl Comes and be back in touch with [facility].

## 2015-01-16 NOTE — Telephone Encounter (Signed)
Marinette, RN     Phone Number: [phone]            Orlando Penner with [facility] calling about stopping patient medication prior to procedure December 16th needs to stop 5 days prior. Please call back.   Fax- [phone]   Thanks, Maudie Mercury

## 2015-01-22 NOTE — Telephone Encounter (Signed)
Form received from [facility] requesting Xarelto be held starting 02/08/15 for colonoscopy on 12/28. OK per Dr. Caryl Comes- form to Medical Records to fax.

## 2015-01-27 ENCOUNTER — Encounter: Payer: Self-pay | Admitting: *Deleted

## 2015-01-27 NOTE — Telephone Encounter (Signed)
Discussed with Dr. Caryl Comes- lithotripsy is low risk. The patient may hold xarelto the night prior to her procedure. Letter faxed to [facility] at [facility]- [phone] stating this and that the patient should on Xarelto once daily and no aspirin. Confirmation received.

## 2015-01-27 NOTE — Telephone Encounter (Signed)
Follow up      Request for surgical clearance:  1. What type of surgery is being performed? lithotripsy  2. When is this surgery scheduled? 01-30-15  3. Are there any medications that need to be held prior to surgery and how long? Stop aspirin and xarelto  4. Name of physician performing surgery?   5. What is your office phone and fax number?  Fax----attn Kennyth Lose at [phone]. This was faxed to [facility] clinic but it did not get faxed to [facility].  Pt is there for preop stuff.  Please fax note to them asap please

## 2015-02-04 ENCOUNTER — Encounter: Payer: Self-pay | Admitting: *Deleted

## 2015-02-20 DIAGNOSIS — N3090 Cystitis, unspecified without hematuria: Secondary | ICD-10-CM | POA: Diagnosis not present

## 2015-02-20 DIAGNOSIS — N201 Calculus of ureter: Secondary | ICD-10-CM | POA: Diagnosis not present

## 2015-02-20 DIAGNOSIS — N3020 Other chronic cystitis without hematuria: Secondary | ICD-10-CM | POA: Diagnosis not present

## 2015-02-27 DIAGNOSIS — E669 Obesity, unspecified: Secondary | ICD-10-CM | POA: Diagnosis not present

## 2015-02-27 DIAGNOSIS — N200 Calculus of kidney: Secondary | ICD-10-CM | POA: Diagnosis not present

## 2015-02-27 DIAGNOSIS — Z7901 Long term (current) use of anticoagulants: Secondary | ICD-10-CM | POA: Diagnosis not present

## 2015-02-27 DIAGNOSIS — I499 Cardiac arrhythmia, unspecified: Secondary | ICD-10-CM | POA: Diagnosis not present

## 2015-03-06 DIAGNOSIS — R300 Dysuria: Secondary | ICD-10-CM | POA: Diagnosis not present

## 2015-03-06 DIAGNOSIS — R311 Benign essential microscopic hematuria: Secondary | ICD-10-CM | POA: Diagnosis not present

## 2015-03-06 DIAGNOSIS — R351 Nocturia: Secondary | ICD-10-CM | POA: Diagnosis not present

## 2015-03-06 DIAGNOSIS — N200 Calculus of kidney: Secondary | ICD-10-CM | POA: Diagnosis not present

## 2015-03-06 DIAGNOSIS — N3021 Other chronic cystitis with hematuria: Secondary | ICD-10-CM | POA: Diagnosis not present

## 2015-03-20 DIAGNOSIS — Z79811 Long term (current) use of aromatase inhibitors: Secondary | ICD-10-CM | POA: Diagnosis not present

## 2015-03-20 DIAGNOSIS — Z853 Personal history of malignant neoplasm of breast: Secondary | ICD-10-CM | POA: Diagnosis not present

## 2015-03-20 DIAGNOSIS — M810 Age-related osteoporosis without current pathological fracture: Secondary | ICD-10-CM | POA: Diagnosis not present

## 2015-03-20 DIAGNOSIS — R748 Abnormal levels of other serum enzymes: Secondary | ICD-10-CM | POA: Diagnosis not present

## 2015-03-20 DIAGNOSIS — C50912 Malignant neoplasm of unspecified site of left female breast: Secondary | ICD-10-CM | POA: Diagnosis not present

## 2015-03-26 DIAGNOSIS — R310 Gross hematuria: Secondary | ICD-10-CM | POA: Diagnosis not present

## 2015-03-26 DIAGNOSIS — N200 Calculus of kidney: Secondary | ICD-10-CM | POA: Diagnosis not present

## 2015-03-26 DIAGNOSIS — N3021 Other chronic cystitis with hematuria: Secondary | ICD-10-CM | POA: Diagnosis not present

## 2015-04-07 DIAGNOSIS — C50212 Malignant neoplasm of upper-inner quadrant of left female breast: Secondary | ICD-10-CM | POA: Diagnosis not present

## 2015-04-09 DIAGNOSIS — N200 Calculus of kidney: Secondary | ICD-10-CM | POA: Diagnosis not present

## 2015-04-09 DIAGNOSIS — N3020 Other chronic cystitis without hematuria: Secondary | ICD-10-CM | POA: Diagnosis not present

## 2015-05-12 DIAGNOSIS — H04222 Epiphora due to insufficient drainage, left lacrimal gland: Secondary | ICD-10-CM | POA: Diagnosis not present

## 2015-05-12 DIAGNOSIS — H2513 Age-related nuclear cataract, bilateral: Secondary | ICD-10-CM | POA: Diagnosis not present

## 2015-06-15 DIAGNOSIS — H04123 Dry eye syndrome of bilateral lacrimal glands: Secondary | ICD-10-CM | POA: Diagnosis not present

## 2015-06-15 DIAGNOSIS — H04222 Epiphora due to insufficient drainage, left lacrimal gland: Secondary | ICD-10-CM | POA: Diagnosis not present

## 2015-06-17 DIAGNOSIS — Z79811 Long term (current) use of aromatase inhibitors: Secondary | ICD-10-CM | POA: Diagnosis not present

## 2015-06-17 DIAGNOSIS — C50912 Malignant neoplasm of unspecified site of left female breast: Secondary | ICD-10-CM | POA: Diagnosis not present

## 2015-06-17 DIAGNOSIS — M810 Age-related osteoporosis without current pathological fracture: Secondary | ICD-10-CM | POA: Diagnosis not present

## 2015-06-17 DIAGNOSIS — C50212 Malignant neoplasm of upper-inner quadrant of left female breast: Secondary | ICD-10-CM | POA: Diagnosis not present

## 2015-07-08 DIAGNOSIS — N3001 Acute cystitis with hematuria: Secondary | ICD-10-CM | POA: Diagnosis not present

## 2015-07-08 DIAGNOSIS — R311 Benign essential microscopic hematuria: Secondary | ICD-10-CM | POA: Diagnosis not present

## 2015-07-15 ENCOUNTER — Inpatient Hospital Stay (HOSPITAL_COMMUNITY): Admission: RE | Admit: 2015-07-15 | Payer: PPO | Source: Ambulatory Visit

## 2015-07-15 ENCOUNTER — Other Ambulatory Visit: Payer: Self-pay

## 2015-07-15 ENCOUNTER — Ambulatory Visit: Payer: PPO | Admitting: Vascular Surgery

## 2015-07-16 ENCOUNTER — Other Ambulatory Visit: Payer: Self-pay | Admitting: *Deleted

## 2015-07-16 MED ORDER — RIVAROXABAN 20 MG PO TABS: 20.0000 mg | ORAL_TABLET | Freq: Every day | ORAL | Status: DC

## 2015-07-16 NOTE — Telephone Encounter (Signed)
OK to refill Xarelto 20 mg one tablet daily #90. We had this discussion and she should be taking this once daily.  Thanks!

## 2015-07-16 NOTE — Telephone Encounter (Signed)
Patient called and requested that the rx for xarelto be sent in for a ninety day supply as a cost savings. She stated that her pharmacy told her that our office had denied to do this. I do not see any note that an rx was denied. There is an open phone call from 07/15/15 in regards to the patient alternating the xarelto with asa, but the patient stated that she does not do that, she takes xarelto 20mg  qd. Ninety day rx sent in.

## 2015-07-17 DIAGNOSIS — I480 Paroxysmal atrial fibrillation: Secondary | ICD-10-CM | POA: Diagnosis not present

## 2015-07-17 DIAGNOSIS — Z6830 Body mass index (BMI) 30.0-30.9, adult: Secondary | ICD-10-CM | POA: Diagnosis not present

## 2015-07-17 DIAGNOSIS — M810 Age-related osteoporosis without current pathological fracture: Secondary | ICD-10-CM | POA: Diagnosis not present

## 2015-07-17 DIAGNOSIS — Z79899 Other long term (current) drug therapy: Secondary | ICD-10-CM | POA: Diagnosis not present

## 2015-07-17 DIAGNOSIS — I872 Venous insufficiency (chronic) (peripheral): Secondary | ICD-10-CM | POA: Diagnosis not present

## 2015-07-17 DIAGNOSIS — E785 Hyperlipidemia, unspecified: Secondary | ICD-10-CM | POA: Diagnosis not present

## 2015-07-17 DIAGNOSIS — K219 Gastro-esophageal reflux disease without esophagitis: Secondary | ICD-10-CM | POA: Diagnosis not present

## 2015-07-17 DIAGNOSIS — E119 Type 2 diabetes mellitus without complications: Secondary | ICD-10-CM | POA: Diagnosis not present

## 2015-07-22 ENCOUNTER — Ambulatory Visit: Payer: PPO | Admitting: Vascular Surgery

## 2015-07-22 ENCOUNTER — Encounter (HOSPITAL_COMMUNITY): Payer: PPO

## 2015-07-23 ENCOUNTER — Encounter: Payer: Self-pay | Admitting: Vascular Surgery

## 2015-07-29 ENCOUNTER — Ambulatory Visit (HOSPITAL_COMMUNITY)
Admission: RE | Admit: 2015-07-29 | Discharge: 2015-07-29 | Disposition: A | Discharge status: Home / Self Care | Payer: PPO | Source: Ambulatory Visit | Attending: Vascular Surgery | Admitting: Vascular Surgery

## 2015-07-29 ENCOUNTER — Other Ambulatory Visit: Payer: Self-pay | Admitting: Vascular Surgery

## 2015-07-29 ENCOUNTER — Encounter: Payer: Self-pay | Admitting: Vascular Surgery

## 2015-07-29 ENCOUNTER — Ambulatory Visit (INDEPENDENT_AMBULATORY_CARE_PROVIDER_SITE_OTHER): Payer: PPO | Admitting: Vascular Surgery

## 2015-07-29 VITALS — BP 124/75 | HR 78 | Temp 98.2°F | Resp 18 | Ht 64.0 in | Wt 176.6 lb

## 2015-07-29 DIAGNOSIS — I6521 Occlusion and stenosis of right carotid artery: Secondary | ICD-10-CM | POA: Diagnosis not present

## 2015-07-29 DIAGNOSIS — I509 Heart failure, unspecified: Secondary | ICD-10-CM | POA: Insufficient documentation

## 2015-07-29 DIAGNOSIS — I6529 Occlusion and stenosis of unspecified carotid artery: Secondary | ICD-10-CM

## 2015-07-29 NOTE — Progress Notes (Signed)
[facility] [place]  Patient name: Colleen Galloway MRN: [id number] DOB: Jul 30, 1934 Sex: female  REASON FOR VISIT: Follow up of right carotid stenosis  HPI: Colleen Galloway is a 81 y.o. female who I last saw on 07/09/2014. I have been following a moderate right carotid stenosis. At that time she was asymptomatic. She had a 50-69% right carotid stenosis with a less than 49% left carotid stenosis. I set her up for a one year follow up visit.  Since I saw her last, she denies any history of stroke, TIAs, expressive or receptive aphasia, or amaurosis fugax. She does note some paresthesias in her anterior right thigh that this is constant and may be referred from her back arthritis.  The patient is on Xeralto and therefore is not on aspirin. She is on Zocor.   Past Medical History  Diagnosis Date  . Atrial fibrillation ([place])   . Lumbosacral spondylosis without myelopathy   . Carotid artery disease ([place])     Followed by Dr. Scot Dock  . Insomnia, unspecified   . Esophageal reflux   . Fibrocystic disease of breast   . Osteoarthrosis, unspecified whether generalized or localized, unspecified site   . Urinary tract infection   . Arthritis   . Unspecified sinusitis (chronic)   . Orthostatic lightheadedness 10/20/2011  . Calculus of kidney     kidney stones  . Breast cancer ([place]) 09/17/13    Left iNVASIVE DUCTAL,dcis  . Allergy   . PONV (postoperative nausea and vomiting)   . CHF (congestive heart failure) ([place])   . Family history of colon cancer   . Family history of rectal cancer   . Dysrhythmia     a-fib    Family History  Problem Relation Age of Onset  . Heart disease Mother   . Asthma Mother   . Hypertension Mother   . Deep vein thrombosis Mother   . Other Mother     varicose veins  . Lung cancer Mother 102    non-smoker  . Heart disease Father   . Hypertension Father   . Stroke Father   . Heart disease Sister   . Hypertension Sister   . Hyperlipidemia  Sister   . Rectal cancer Sister 34  . Heart disease Brother   . Asthma Brother   . Hypertension Brother   . Deep vein thrombosis Brother   . Diabetes Brother   . Colon cancer Other 36    maternal cousin's son; NOS  . Colon cancer Maternal Grandfather 55  . Liver disease Brother 40    Cirrhosis due to infection from gallbladder  . Melanoma Other     SOCIAL HISTORY: Social History  Substance Use Topics  . Smoking status: Never Smoker   . Smokeless tobacco: Never Used  . Alcohol Use: No    Allergies  Allergen Reactions  . Diltiazem Hcl     Pt states it causes her ankle swelling   . Flecainide Other (See Comments)    Sob, visual problems, swelling ankles  . Lopressor [Metoprolol Tartrate]     Severe dizziness. Irregular heartbeat   . Nebivolol Swelling    Sob, visual problems, swelling in ankles  . Sodium Pantothenate     swelling    Current Outpatient Prescriptions  Medication Sig Dispense Refill  . acetaminophen (TYLENOL) 500 MG tablet Take 500 mg by mouth every 6 (six) hours as needed.    Marland Kitchen anastrozole (ARIMIDEX) 1 MG tablet Take 1 mg by  mouth daily.    . calcium citrate-vitamin D (CITRACAL+D) 315-200 MG-UNIT per tablet Take 1 tablet by mouth daily.     . chlorthalidone (HYGROTON) 25 MG tablet Take 25 mg by mouth daily as needed (for fluid).     Marland Kitchen doxycycline (ADOXA) 50 MG tablet Take 50 mg by mouth daily as needed (for rosacea).     . ezetimibe (ZETIA) 10 MG tablet Take 10 mg by mouth daily.    . meclizine (ANTIVERT) 25 MG tablet Take 1 tablet (25 mg total) by mouth 3 (three) times daily as needed for dizziness. 90 tablet 2  . meloxicam (MOBIC) 15 MG tablet Take 15 mg by mouth daily as needed for pain.     . metoprolol succinate (TOPROL-XL) 25 MG 24 hr tablet Take 1 tablet (25 mg total) by mouth daily. 90 tablet 1  . Multiple Vitamin (MULTIVITAMIN) capsule Take 1 capsule by mouth daily.      Marland Kitchen omeprazole (PRILOSEC) 20 MG capsule Take 20 mg by mouth daily.      .  rivaroxaban (XARELTO) 20 MG TABS tablet Take 1 tablet (20 mg total) by mouth daily with supper. 90 tablet 0  . traZODone (DESYREL) 50 MG tablet Take 50-100 mg by mouth daily as needed for sleep.     Marland Kitchen lidocaine-prilocaine (EMLA) cream Apply 1 application topically as needed. (Patient not taking: Reported on 07/29/2015) 30 g 0  . simvastatin (ZOCOR) 40 MG tablet Take 40 mg by mouth daily. Reported on 07/29/2015    . Vaginal Lubricant (REPLENS) GEL Place 1 Applicatorful vaginally daily as needed (for dryness). Reported on 07/29/2015     No current facility-administered medications for this visit.    REVIEW OF SYSTEMS:  [X]  denotes positive finding, [ ]  denotes negative finding Cardiac  Comments:  Chest pain or chest pressure:    Shortness of breath upon exertion:    Short of breath when lying flat:    Irregular heart rhythm: X A. fib      Vascular    Pain in calf, thigh, or hip brought on by ambulation:    Pain in feet at night that wakes you up from your sleep:     Blood clot in your veins:    Leg swelling:         Pulmonary    Oxygen at home:    Productive cough:     Wheezing:         Neurologic    Sudden weakness in arms or legs:     Sudden numbness in arms or legs:     Sudden onset of difficulty speaking or slurred speech:    Temporary loss of vision in one eye:     Problems with dizziness:         Gastrointestinal    Blood in stool:     Vomited blood:         Genitourinary    Burning when urinating:     Blood in urine:        Psychiatric    Major depression:         Hematologic    Bleeding problems:    Problems with blood clotting too easily:        Skin    Rashes or ulcers:        Constitutional    Fever or chills:      PHYSICAL EXAM: Filed Vitals:   07/29/15 1247  BP: 124/75  Pulse: 78  Temp: 98.2 F (  36.8 C)  TempSrc: Oral  Resp: 18  Height: 5\' 4"  (1.626 m)  Weight: 176 lb 9.6 oz (80.105 kg)  SpO2: 96%    GENERAL: The patient is a  well-nourished female, in no acute distress. The vital signs are documented above. CARDIAC: There is a regular rate and rhythm.  VASCULAR: She has a right carotid bruit. PULMONARY: There is good air exchange bilaterally without wheezing or rales. ABDOMEN: Soft and non-tender with normal pitched bowel sounds.  MUSCULOSKELETAL: There are no major deformities or cyanosis. NEUROLOGIC: No focal weakness or paresthesias are detected. SKIN: There are no ulcers or rashes noted. PSYCHIATRIC: The patient has a normal affect.  DATA:   CAROTID DUPLEX: I have independently interpreted her carotid duplex scan. She has a 40-59% right carotid stenosis with no significant stenosis on the left. There has been no significant change compared to the study one year ago.  MEDICAL ISSUES:  ASYMPTOMATIC RIGHT CAROTID STENOSIS: This patient has a symptom at 40-59% right carotid stenosis with no significant stenosis on the left. The stenosis on the right has remained stable. For this reason I think it is safe to continue her follow up at 1 year. I have ordered a follow up carotid duplex scan in 1 year and I will see her back at that time. She knows to call sooner if she has problems. She is not a smoker.  Deitra Mayo [facility] [place] [phone]

## 2015-08-01 DIAGNOSIS — J209 Acute bronchitis, unspecified: Secondary | ICD-10-CM | POA: Diagnosis not present

## 2015-08-06 DIAGNOSIS — E663 Overweight: Secondary | ICD-10-CM | POA: Diagnosis not present

## 2015-08-06 DIAGNOSIS — J019 Acute sinusitis, unspecified: Secondary | ICD-10-CM | POA: Diagnosis not present

## 2015-08-06 DIAGNOSIS — Z6828 Body mass index (BMI) 28.0-28.9, adult: Secondary | ICD-10-CM | POA: Diagnosis not present

## 2015-08-06 DIAGNOSIS — J208 Acute bronchitis due to other specified organisms: Secondary | ICD-10-CM | POA: Diagnosis not present

## 2015-09-07 DIAGNOSIS — R3915 Urgency of urination: Secondary | ICD-10-CM | POA: Diagnosis not present

## 2015-09-07 DIAGNOSIS — N200 Calculus of kidney: Secondary | ICD-10-CM | POA: Diagnosis not present

## 2015-09-07 DIAGNOSIS — N3020 Other chronic cystitis without hematuria: Secondary | ICD-10-CM | POA: Diagnosis not present

## 2015-09-07 DIAGNOSIS — R351 Nocturia: Secondary | ICD-10-CM | POA: Diagnosis not present

## 2015-09-10 NOTE — Addendum Note (Signed)
Addended by: Reola Calkins on: 09/10/2015 03:15 PM   Modules accepted: Orders

## 2015-09-21 DIAGNOSIS — Z853 Personal history of malignant neoplasm of breast: Secondary | ICD-10-CM | POA: Diagnosis not present

## 2015-10-08 DIAGNOSIS — N951 Menopausal and female climacteric states: Secondary | ICD-10-CM | POA: Diagnosis not present

## 2015-10-08 DIAGNOSIS — Z01419 Encounter for gynecological examination (general) (routine) without abnormal findings: Secondary | ICD-10-CM | POA: Diagnosis not present

## 2015-10-09 ENCOUNTER — Encounter: Payer: Self-pay | Admitting: Internal Medicine

## 2015-10-09 ENCOUNTER — Ambulatory Visit (INDEPENDENT_AMBULATORY_CARE_PROVIDER_SITE_OTHER): Payer: PPO | Admitting: Internal Medicine

## 2015-10-09 ENCOUNTER — Encounter (INDEPENDENT_AMBULATORY_CARE_PROVIDER_SITE_OTHER): Payer: Self-pay

## 2015-10-09 VITALS — BP 134/70 | HR 74 | Ht 63.0 in | Wt 170.4 lb

## 2015-10-09 DIAGNOSIS — I10 Essential (primary) hypertension: Secondary | ICD-10-CM

## 2015-10-09 DIAGNOSIS — I480 Paroxysmal atrial fibrillation: Secondary | ICD-10-CM

## 2015-10-09 MED ORDER — RIVAROXABAN 20 MG PO TABS: 20.0000 mg | ORAL_TABLET | Freq: Every day | ORAL | 3 refills | Status: DC

## 2015-10-09 NOTE — Patient Instructions (Signed)

## 2015-10-09 NOTE — Progress Notes (Signed)
Patient Care Team: Nicoletta Dress, MD as PCP - General (Internal Medicine) Deboraha Sprang, MD as Attending Physician (Cardiology) Nicholas Lose, MD as Consulting Physician (Hematology and Oncology)   HPI  Colleen Galloway is a 81 y.o. female Seen in followup for atrial fibrillation for which she is anticoagulated. She had problems with edema on calcium channel blockers and has been managed with beta blockers.  She was diagnosed with breast cancer and underwent partial mastectomy, chemotherapy and radiation therapy. She has now completed treatment.   Her atrial fibrillation is less problematic. He is having no bleeding issues on her Xarelto.     Echocardiogram 3/16 was normal there was minimal left atrial enlargement and minimal  pulmonary artery pressure elevation--40        Past Medical History:  Diagnosis Date  . Allergy   . Arthritis   . Atrial fibrillation ([place])   . Breast cancer ([place]) 09/17/13   Left iNVASIVE DUCTAL,dcis  . Calculus of kidney    kidney stones  . Carotid artery disease ([place])    Followed by Dr. Scot Dock  . CHF (congestive heart failure) ([place])   . Dysrhythmia    a-fib  . Esophageal reflux   . Family history of colon cancer   . Family history of rectal cancer   . Fibrocystic disease of breast   . Insomnia, unspecified   . Lumbosacral spondylosis without myelopathy   . Orthostatic lightheadedness 10/20/2011  . Osteoarthrosis, unspecified whether generalized or localized, unspecified site   . PONV (postoperative nausea and vomiting)   . Unspecified sinusitis (chronic)   . Urinary tract infection     Past Surgical History:  Procedure Laterality Date  . BREAST LUMPECTOMY WITH NEEDLE LOCALIZATION AND AXILLARY SENTINEL LYMPH NODE BX Left 10/18/2013   Procedure: LEFT AXILLARY LYMPHATIC MAPPING; INJECTION OF METHYLENE BLUE INTO LEFT BREAST; LEFT BREAST PARTIAL MASTECTOMY AFTER NEEDLE LOCALIZATION; AXILLARY SENTINEL LYMPH NODE BIOPSY;  Surgeon: Jackolyn Confer, MD;  Location: [facility];  Service: General;  Laterality: Left;  . DILATION AND CURETTAGE OF UTERUS  1960  . KIDNEY STONE SURGERY    . PORT-A-CATH REMOVAL Right 12/26/2014   Procedure: REMOVAL PORT-A-CATH;  Surgeon: Jackolyn Confer, MD;  Location: [facility];  Service: General;  Laterality: Right;  . PORTACATH PLACEMENT N/A 10/18/2013   Procedure: INSERTION PORT-A-CATH/ULTRASOUND GUIDED,RIGHT INTERNAL JUGULAR;  Surgeon: Jackolyn Confer, MD;  Location: [place];  Service: General;  Laterality: N/A;  . varicose veins      Current Outpatient Prescriptions  Medication Sig Dispense Refill  . acetaminophen (TYLENOL) 500 MG tablet Take 500 mg by mouth every 6 (six) hours as needed.    Marland Kitchen anastrozole (ARIMIDEX) 1 MG tablet Take 1 mg by mouth daily.    . calcium citrate-vitamin D (CITRACAL+D) 315-200 MG-UNIT per tablet Take 1 tablet by mouth daily.     . chlorthalidone (HYGROTON) 25 MG tablet Take 25 mg by mouth daily as needed (for fluid).     Marland Kitchen doxycycline (ADOXA) 50 MG tablet Take 50 mg by mouth daily as needed (for rosacea).     . meclizine (ANTIVERT) 25 MG tablet Take 1 tablet (25 mg total) by mouth 3 (three) times daily as needed for dizziness. 90 tablet 2  . meloxicam (MOBIC) 15 MG tablet Take 15 mg by mouth daily as needed for pain.     . metoprolol succinate (TOPROL-XL) 25 MG 24 hr tablet Take 1 tablet (25 mg total) by mouth daily. [place]  tablet 1  . Multiple Vitamin (MULTIVITAMIN) capsule Take 1 capsule by mouth daily.      Marland Kitchen omeprazole (PRILOSEC) 20 MG capsule Take 20 mg by mouth daily.      . rivaroxaban (XARELTO) 20 MG TABS tablet Take 1 tablet (20 mg total) by mouth daily with supper. 90 tablet 0  . traZODone (DESYREL) 50 MG tablet Take 50-100 mg by mouth daily as needed for sleep.     . Vaginal Lubricant (REPLENS) GEL Place 1 Applicatorful vaginally daily as needed (for dryness). Reported on 07/29/2015     No current facility-administered medications for this visit.      Allergies  Allergen Reactions  . Diltiazem Hcl     Pt states it causes her ankle swelling   . Flecainide Other (See Comments)    Sob, visual problems, swelling ankles  . Lopressor [Metoprolol Tartrate]     Severe dizziness. Irregular heartbeat   . Nebivolol Swelling    Sob, visual problems, swelling in ankles  . Sodium Pantothenate     swelling    Review of Systems negative except from HPI and PMH  Physical Exam BP 134/70   Pulse 74   Ht 5\' 3"  (1.6 m)   Wt 170 lb 6.4 oz (77.3 kg)   SpO2 99%   BMI 30.19 kg/m  Well developed and well nourished in no acute distress HENT normal E scleral and icterus clear Neck Supple JVP flat; carotids brisk and full Clear to ausculation  Regular rate and rhythm, no murmurs gallops or rub Soft with active bowel sounds No clubbing cyanosis  Edema Alert and oriented, grossly normal motor and sensory function Skin Warm and Dry   ECG demonstrates sinus rhythm at 73 Intervals 15/07/40 Axis left -15 PACs  Assessment and  Plan  Atrial fibrillation-paroxysmal  Hypertension    Breast Cancer-therapy over  the patient continues with paroxysms of atrial fibrillation Although surprisingly a better following her cancer therapy.   She remains on beta-blockade and anticoagulation. Blood pressure is reasonably controlled    Blood pressure well controlled

## 2015-10-29 DIAGNOSIS — R921 Mammographic calcification found on diagnostic imaging of breast: Secondary | ICD-10-CM | POA: Diagnosis not present

## 2015-10-29 DIAGNOSIS — Z853 Personal history of malignant neoplasm of breast: Secondary | ICD-10-CM | POA: Diagnosis not present

## 2015-11-09 DIAGNOSIS — H25813 Combined forms of age-related cataract, bilateral: Secondary | ICD-10-CM | POA: Diagnosis not present

## 2015-12-01 DIAGNOSIS — M1712 Unilateral primary osteoarthritis, left knee: Secondary | ICD-10-CM | POA: Diagnosis not present

## 2015-12-02 DIAGNOSIS — M25562 Pain in left knee: Secondary | ICD-10-CM | POA: Diagnosis not present

## 2015-12-02 DIAGNOSIS — M6281 Muscle weakness (generalized): Secondary | ICD-10-CM | POA: Diagnosis not present

## 2015-12-02 DIAGNOSIS — R2689 Other abnormalities of gait and mobility: Secondary | ICD-10-CM | POA: Diagnosis not present

## 2015-12-02 DIAGNOSIS — M1712 Unilateral primary osteoarthritis, left knee: Secondary | ICD-10-CM | POA: Diagnosis not present

## 2015-12-02 DIAGNOSIS — M25662 Stiffness of left knee, not elsewhere classified: Secondary | ICD-10-CM | POA: Diagnosis not present

## 2015-12-09 DIAGNOSIS — M25562 Pain in left knee: Secondary | ICD-10-CM | POA: Diagnosis not present

## 2015-12-09 DIAGNOSIS — R2689 Other abnormalities of gait and mobility: Secondary | ICD-10-CM | POA: Diagnosis not present

## 2015-12-09 DIAGNOSIS — M1712 Unilateral primary osteoarthritis, left knee: Secondary | ICD-10-CM | POA: Diagnosis not present

## 2015-12-09 DIAGNOSIS — M6281 Muscle weakness (generalized): Secondary | ICD-10-CM | POA: Diagnosis not present

## 2015-12-09 DIAGNOSIS — M25662 Stiffness of left knee, not elsewhere classified: Secondary | ICD-10-CM | POA: Diagnosis not present

## 2015-12-14 DIAGNOSIS — C44619 Basal cell carcinoma of skin of left upper limb, including shoulder: Secondary | ICD-10-CM | POA: Diagnosis not present

## 2015-12-14 DIAGNOSIS — L821 Other seborrheic keratosis: Secondary | ICD-10-CM | POA: Diagnosis not present

## 2015-12-14 DIAGNOSIS — L570 Actinic keratosis: Secondary | ICD-10-CM | POA: Diagnosis not present

## 2015-12-16 DIAGNOSIS — M810 Age-related osteoporosis without current pathological fracture: Secondary | ICD-10-CM | POA: Diagnosis not present

## 2015-12-16 DIAGNOSIS — Z853 Personal history of malignant neoplasm of breast: Secondary | ICD-10-CM | POA: Diagnosis not present

## 2015-12-28 DIAGNOSIS — D0462 Carcinoma in situ of skin of left upper limb, including shoulder: Secondary | ICD-10-CM | POA: Diagnosis not present

## 2016-01-26 DIAGNOSIS — I872 Venous insufficiency (chronic) (peripheral): Secondary | ICD-10-CM | POA: Diagnosis not present

## 2016-01-26 DIAGNOSIS — E119 Type 2 diabetes mellitus without complications: Secondary | ICD-10-CM | POA: Diagnosis not present

## 2016-01-26 DIAGNOSIS — Z6830 Body mass index (BMI) 30.0-30.9, adult: Secondary | ICD-10-CM | POA: Diagnosis not present

## 2016-01-26 DIAGNOSIS — Z9181 History of falling: Secondary | ICD-10-CM | POA: Diagnosis not present

## 2016-01-26 DIAGNOSIS — M810 Age-related osteoporosis without current pathological fracture: Secondary | ICD-10-CM | POA: Diagnosis not present

## 2016-01-26 DIAGNOSIS — Z1389 Encounter for screening for other disorder: Secondary | ICD-10-CM | POA: Diagnosis not present

## 2016-01-26 DIAGNOSIS — E785 Hyperlipidemia, unspecified: Secondary | ICD-10-CM | POA: Diagnosis not present

## 2016-01-26 DIAGNOSIS — Z79899 Other long term (current) drug therapy: Secondary | ICD-10-CM | POA: Diagnosis not present

## 2016-01-26 DIAGNOSIS — I480 Paroxysmal atrial fibrillation: Secondary | ICD-10-CM | POA: Diagnosis not present

## 2016-02-23 DIAGNOSIS — J019 Acute sinusitis, unspecified: Secondary | ICD-10-CM | POA: Diagnosis not present

## 2016-02-23 DIAGNOSIS — Z6831 Body mass index (BMI) 31.0-31.9, adult: Secondary | ICD-10-CM | POA: Diagnosis not present

## 2016-02-26 ENCOUNTER — Other Ambulatory Visit: Payer: Self-pay | Admitting: Nurse Practitioner

## 2016-03-02 DIAGNOSIS — R197 Diarrhea, unspecified: Secondary | ICD-10-CM | POA: Diagnosis not present

## 2016-03-02 DIAGNOSIS — R112 Nausea with vomiting, unspecified: Secondary | ICD-10-CM | POA: Diagnosis not present

## 2016-03-02 DIAGNOSIS — E860 Dehydration: Secondary | ICD-10-CM | POA: Diagnosis not present

## 2016-03-11 DIAGNOSIS — M8589 Other specified disorders of bone density and structure, multiple sites: Secondary | ICD-10-CM | POA: Diagnosis not present

## 2016-03-11 DIAGNOSIS — M810 Age-related osteoporosis without current pathological fracture: Secondary | ICD-10-CM | POA: Diagnosis not present

## 2016-03-29 DIAGNOSIS — N200 Calculus of kidney: Secondary | ICD-10-CM | POA: Diagnosis not present

## 2016-03-29 DIAGNOSIS — N3000 Acute cystitis without hematuria: Secondary | ICD-10-CM | POA: Diagnosis not present

## 2016-04-07 IMAGING — MR MR BREAST BILATERAL W WO CONTRAST
7 of 10 series · 30 of 48 positions shown · IV contrast (multihance)
Comparison: Mammography 09/17/2013 (left), 09/09/2013 (left),
09/02/2013 (bilateral), dating back to 08/06/2010. Prior imaging was
performed at [facility].

CLINICAL DATA: Biopsy proven invasive ductal carcinoma and DCIS on
recent stereotactic biopsy. MRI requested for surgical planning.

LABS:  BUN and creatinine were obtained on site at [HOSPITAL]
[HOSPITAL] [HOSPITAL].
Results:  BUN 14 mg/dL, Creatinine 0.7 mg/dL, estimated GFR 81.
EXAM:
BILATERAL BREAST MRI WITH AND WITHOUT CONTRAST
TECHNIQUE: Multiplanar, multisequence MR images of both breasts were obtained
prior to and following the intravenous administration of 15 ml of
MultiHance.

[Series 2: T2 · axial · 3.0mm · 0.94mm/px · z∈[-69,+107]mm · 3 of 60 slices shown]
[im 1/60]
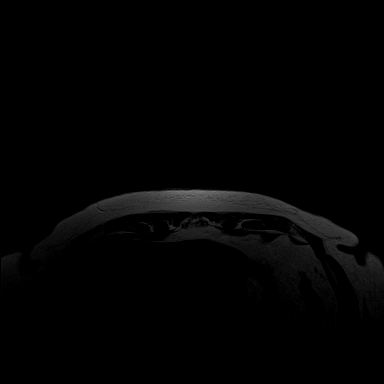
[im 30/60]
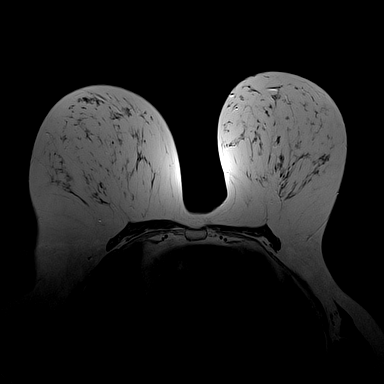
[im 60/60]
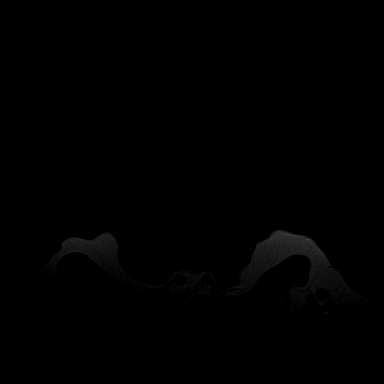

[Series 3: t2_tirm_tra ipat (a-p) · axial · 3.0mm · 0.70mm/px · z∈[-69,+107]mm · 3 of 60 slices shown]
[im 1/60]
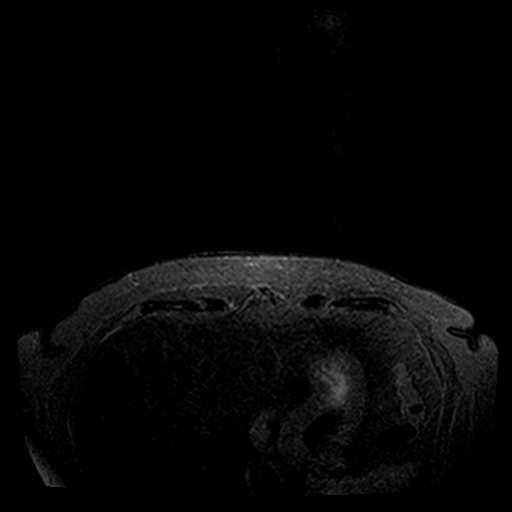
[im 30/60]
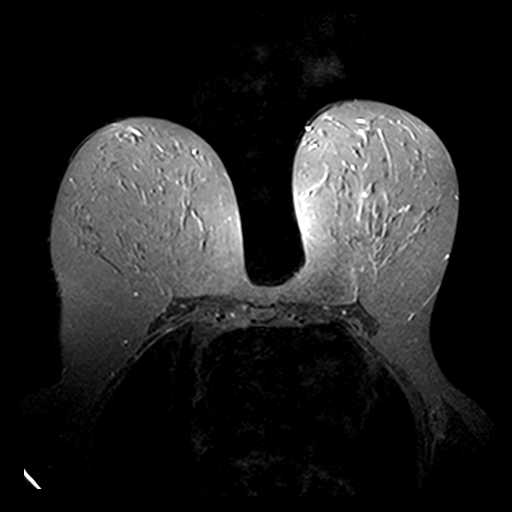
[im 60/60]
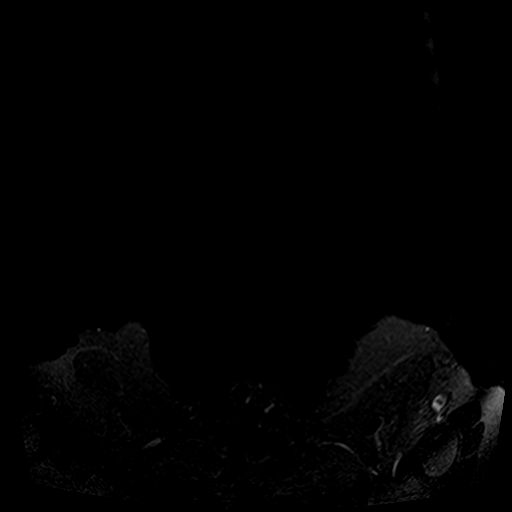

[Series 4: fl3d pre-cm no · axial · non-contrast · 1.2mm · 0.94mm/px · z∈[-76,+114]mm · 6 of 160 slices shown]
[im 1/160]
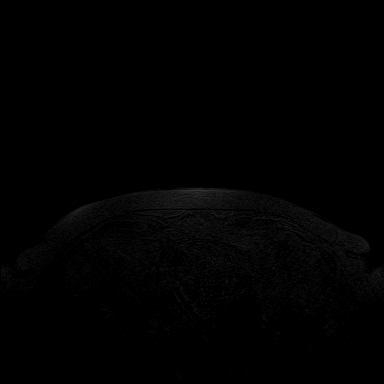
[im 32/160]
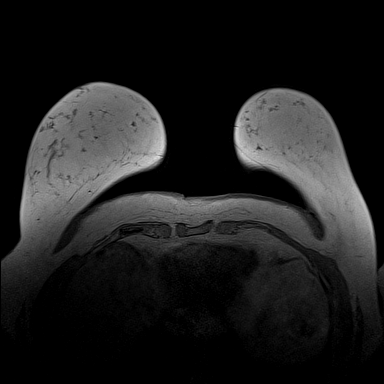
[im 64/160]
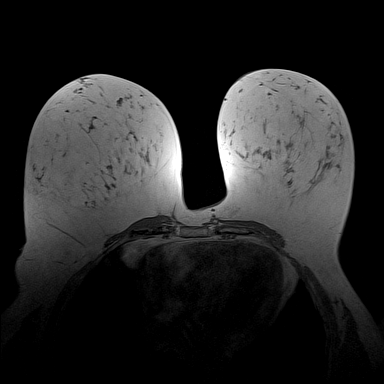
[im 96/160]
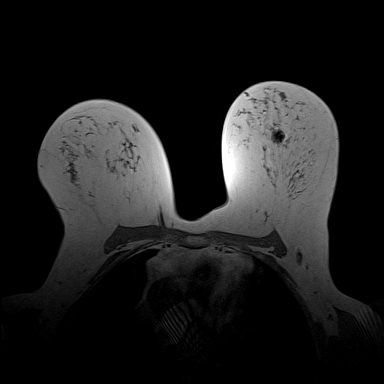
[im 128/160]
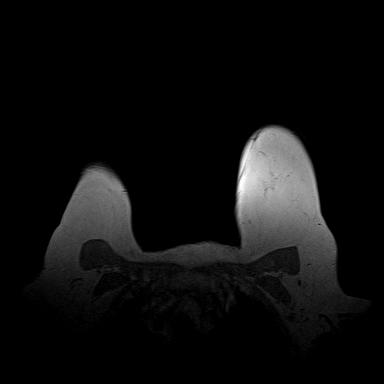
[im 160/160]
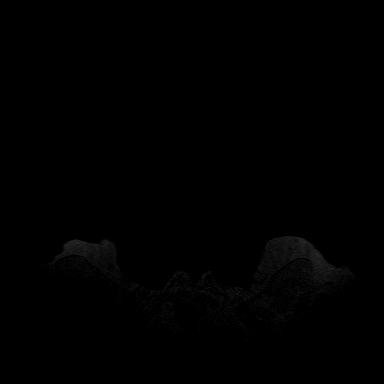

[Series 5: fl3d pre-cm · axial · non-contrast · 1.2mm · 0.94mm/px · z∈[-76,+114]mm · 6 of 160 slices shown]
[im 1/160]
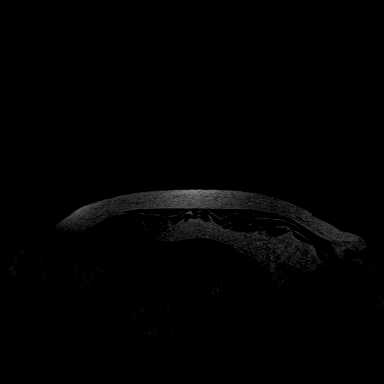
[im 32/160]
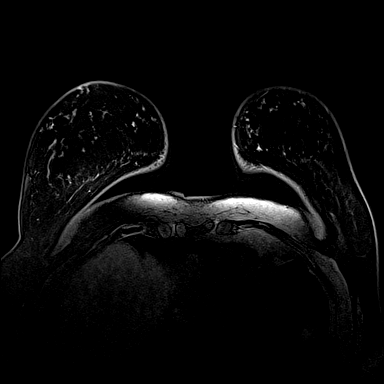
[im 64/160]
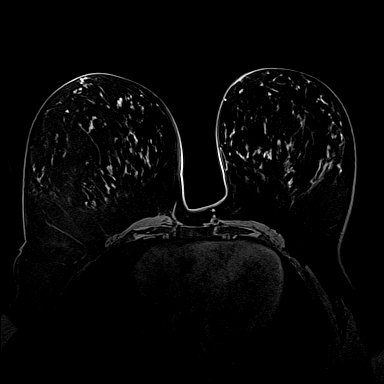
[im 96/160]
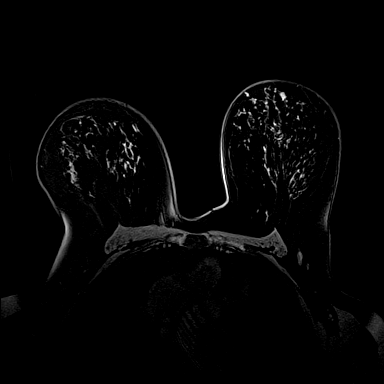
[im 128/160]
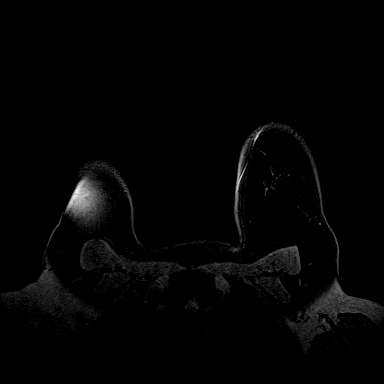
[im 160/160]
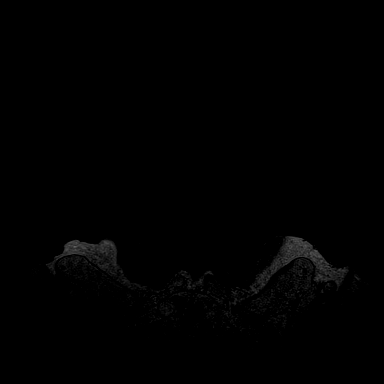

[Series 6: fl3d post-cm 20 · axial · 1.2mm · 0.94mm/px · z∈[-76,+114]mm · 5 of 160 slices shown (1 of 2)]
[im 1/160]
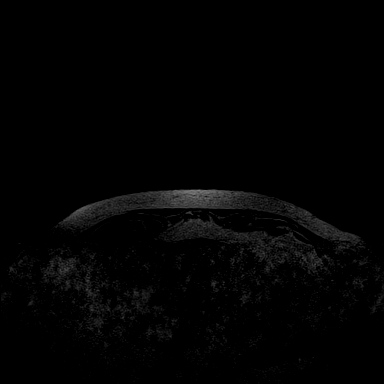
[im 40/160]
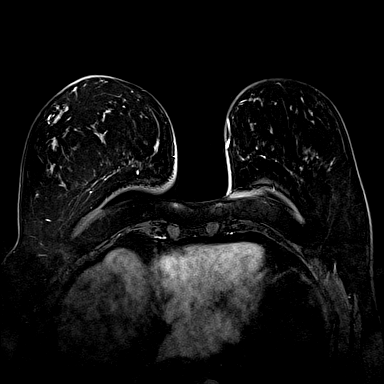
[im 80/160]
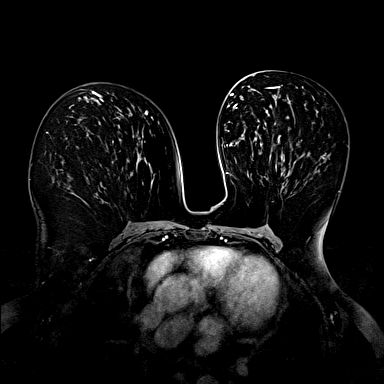
[im 120/160]
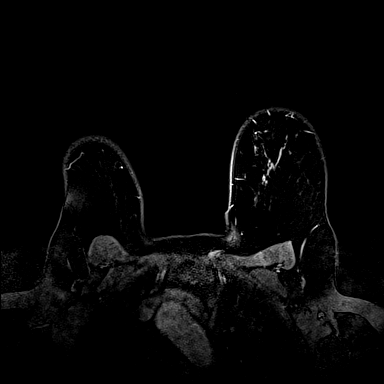
[im 160/160]
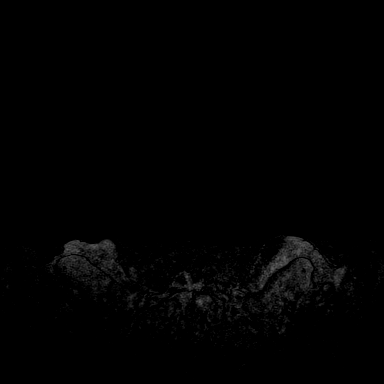

[Series 7: fl3d post-cm 20 · axial · 1.2mm · 0.94mm/px · z∈[-76,+114]mm · 5 of 160 slices shown (2 of 2)]
[im 1/160]
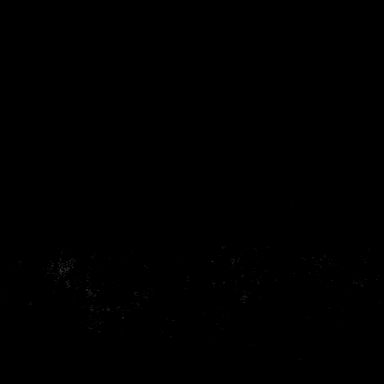
[im 40/160]
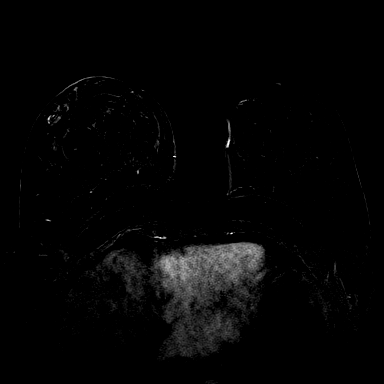
[im 80/160]
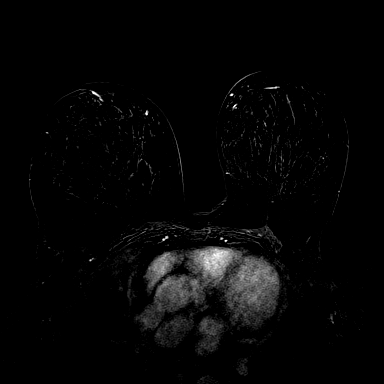
[im 120/160]
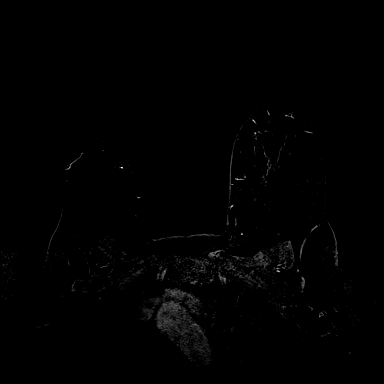
[im 160/160]
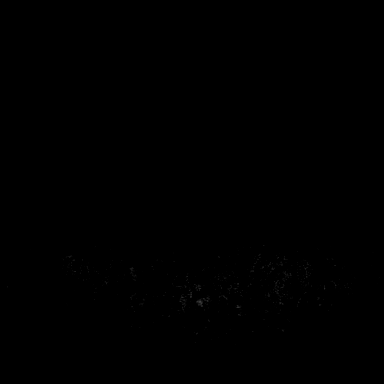

[Series 9: fl3d post-cm 3min · axial · 1.2mm · 0.94mm/px · z∈[-76,-30]mm · 2 of 160 slices shown]
[im 1/160]
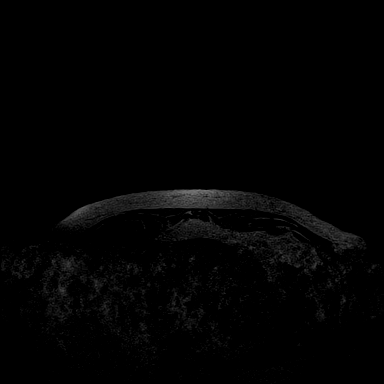
[im 40/160]
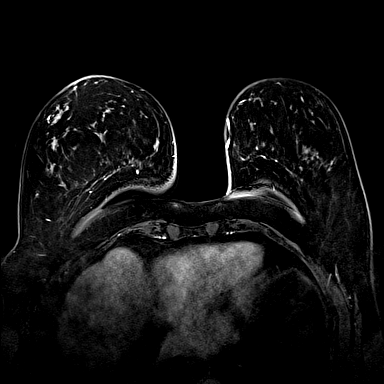

[30 of 48 positions shown; findings below may reference images not displayed]

THREE-DIMENSIONAL MR IMAGE RENDERING ON INDEPENDENT WORKSTATION:

Three-dimensional MR images were rendered by post-processing of the
original MR data on an independent workstation. The
three-dimensional MR images were interpreted, and findings are
reported in the following complete MRI report for this study. Three
dimensional images were evaluated at the independent [employer]
workstation.
FINDINGS: Breast composition: d.  Extreme fibroglandular tissue.

Background parenchymal enhancement: Moderate.

Right breast: No mass or abnormal enhancement.

Left breast: Approximate 1.0 x 0.7 cm enhancing mass with type 3
washout kinetics in the upper left breast between 11 o'clock and 12
o'clock, middle depth, consistent with biopsy-proven invasive ductal
carcinoma. This is associated with linear non mass enhancement with
type 3 washout kinetics consistent with biopsy-proven DCIS which
spans approximately 1.8 cm. No abnormal enhancement elsewhere in the
left breast.

Lymph nodes: No abnormal appearing lymph nodes.

Ancillary findings:  None.
IMPRESSION: 1. Enhancing approximate 1.0 cm mass consistent with biopsy-proven
invasive ductal carcinoma in the upper left breast, middle depth, in
association with linear non mass enhancement consistent with
biopsy-proven DCIS which spans 1.8 cm.
2. No evidence of malignancy elsewhere in the left breast.
3. No evidence of malignancy, right breast.
4. No pathologic lymphadenopathy.

RECOMMENDATION:
Treatment plan.

BI-RADS CATEGORY  6: Known biopsy-proven malignancy.

## 2016-04-25 IMAGING — CR DG CHEST 2V
2 series · 2 of 2 positions shown · non-contrast
Comparison: Prior chest x-ray 08/27/2012

CLINICAL DATA: Preoperative chest x-ray

EXAM:
CHEST  2 VIEW

[w chest pa]
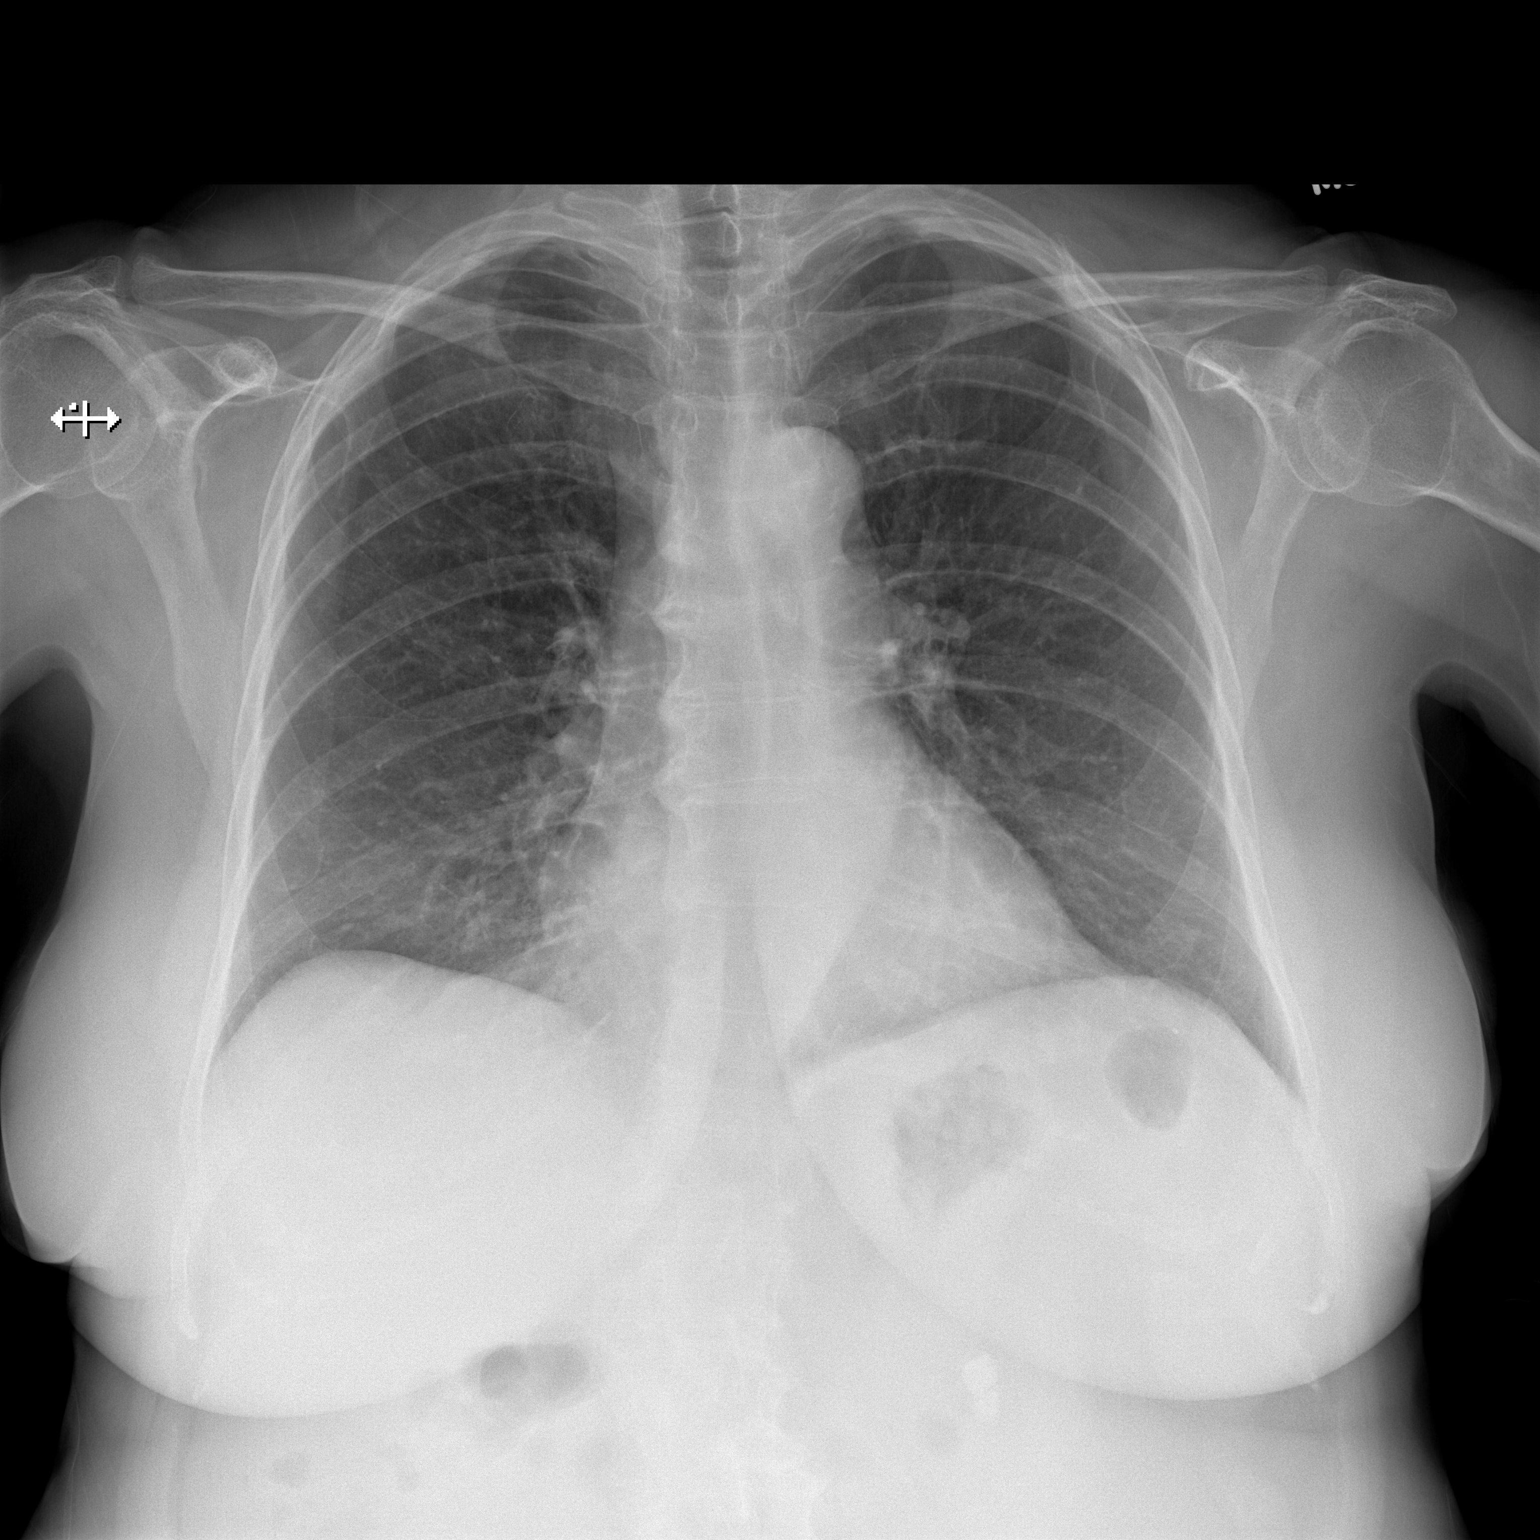

[w chest lat]
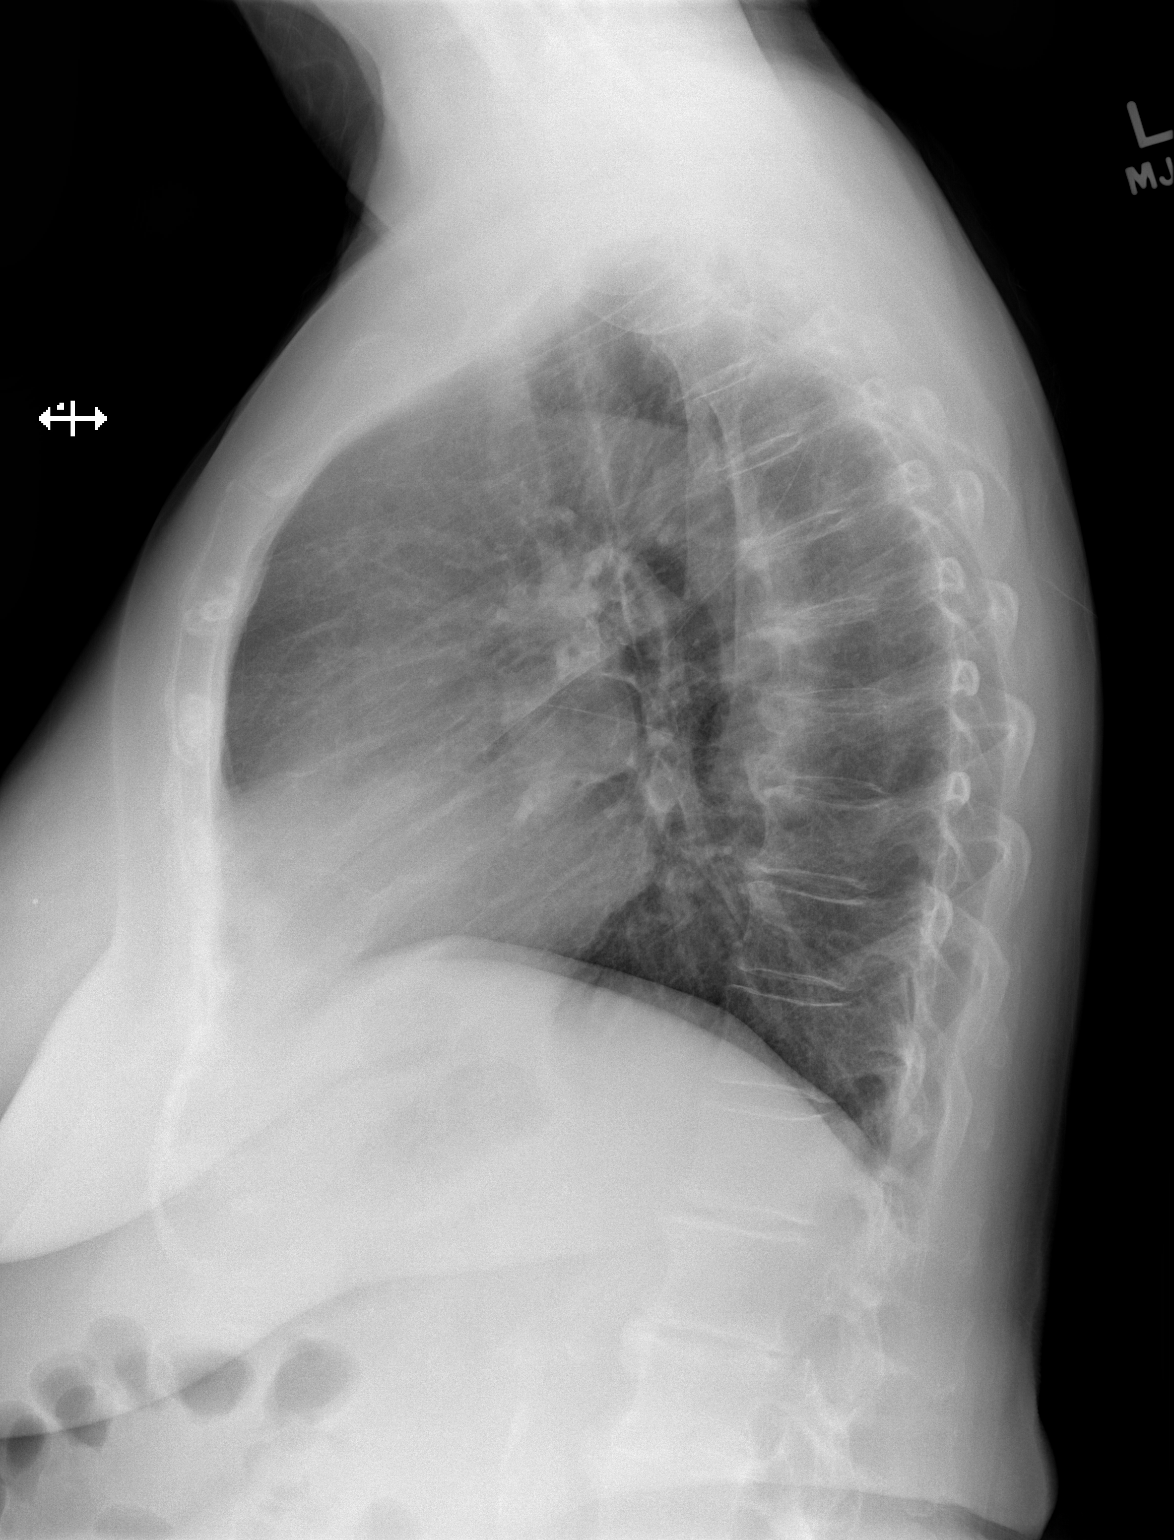

[2 of 2 positions shown; findings below may reference images not displayed]

FINDINGS: The lungs are clear and negative for focal airspace consolidation,
pulmonary edema or suspicious pulmonary nodule. No pleural effusion
or pneumothorax. Cardiac and mediastinal contours are within normal
limits. Trace atherosclerotic calcifications within the thoracic
aorta. No acute fracture or lytic or blastic osseous lesions. Mild
multilevel degenerative spurring in the mid thoracic spine. The
visualized upper abdominal bowel gas pattern is unremarkable.
Calcifications of left upper quadrant seen on prior imaging
consistent with nephrolithiasis.
IMPRESSION: No active cardiopulmonary disease.

Left nephrolithiasis.

## 2016-05-06 DIAGNOSIS — I480 Paroxysmal atrial fibrillation: Secondary | ICD-10-CM | POA: Diagnosis not present

## 2016-05-06 DIAGNOSIS — R399 Unspecified symptoms and signs involving the genitourinary system: Secondary | ICD-10-CM | POA: Diagnosis not present

## 2016-05-06 DIAGNOSIS — Z6830 Body mass index (BMI) 30.0-30.9, adult: Secondary | ICD-10-CM | POA: Diagnosis not present

## 2016-06-15 DIAGNOSIS — Z170 Estrogen receptor positive status [ER+]: Secondary | ICD-10-CM | POA: Diagnosis not present

## 2016-06-15 DIAGNOSIS — Z853 Personal history of malignant neoplasm of breast: Secondary | ICD-10-CM | POA: Diagnosis not present

## 2016-06-15 DIAGNOSIS — Z79811 Long term (current) use of aromatase inhibitors: Secondary | ICD-10-CM | POA: Diagnosis not present

## 2016-06-15 DIAGNOSIS — M810 Age-related osteoporosis without current pathological fracture: Secondary | ICD-10-CM | POA: Diagnosis not present

## 2016-06-15 DIAGNOSIS — Z9221 Personal history of antineoplastic chemotherapy: Secondary | ICD-10-CM | POA: Diagnosis not present

## 2016-06-15 DIAGNOSIS — C50912 Malignant neoplasm of unspecified site of left female breast: Secondary | ICD-10-CM | POA: Diagnosis not present

## 2016-06-15 DIAGNOSIS — Z923 Personal history of irradiation: Secondary | ICD-10-CM | POA: Diagnosis not present

## 2016-06-28 DIAGNOSIS — L570 Actinic keratosis: Secondary | ICD-10-CM | POA: Diagnosis not present

## 2016-07-05 DIAGNOSIS — Z6829 Body mass index (BMI) 29.0-29.9, adult: Secondary | ICD-10-CM | POA: Diagnosis not present

## 2016-07-05 DIAGNOSIS — R1011 Right upper quadrant pain: Secondary | ICD-10-CM | POA: Diagnosis not present

## 2016-07-05 DIAGNOSIS — R1012 Left upper quadrant pain: Secondary | ICD-10-CM | POA: Diagnosis not present

## 2016-07-05 DIAGNOSIS — R630 Anorexia: Secondary | ICD-10-CM | POA: Diagnosis not present

## 2016-07-05 DIAGNOSIS — R634 Abnormal weight loss: Secondary | ICD-10-CM | POA: Diagnosis not present

## 2016-07-06 ENCOUNTER — Telehealth: Payer: Self-pay | Admitting: Internal Medicine

## 2016-07-06 DIAGNOSIS — K648 Other hemorrhoids: Secondary | ICD-10-CM | POA: Diagnosis not present

## 2016-07-06 DIAGNOSIS — K5900 Constipation, unspecified: Secondary | ICD-10-CM | POA: Diagnosis not present

## 2016-07-06 DIAGNOSIS — R1033 Periumbilical pain: Secondary | ICD-10-CM | POA: Diagnosis not present

## 2016-07-06 DIAGNOSIS — R1013 Epigastric pain: Secondary | ICD-10-CM | POA: Diagnosis not present

## 2016-07-06 NOTE — Telephone Encounter (Signed)
Request for surgical clearance:  1. What type of surgery is being performed? Upper Endoscopy   2. When is this surgery scheduled? 07-12-16   3. Are there any medications that need to be held prior to surgery and how long?Can pt stop her Xarelto 2 days prior to procedure?   4. Name of physician performing surgery? Dr Christia Reading Misenheimer   5. What is your office phone and fax number? [phone] and fax is [phone]

## 2016-07-07 NOTE — Telephone Encounter (Signed)
Will forward to pharmacy to review.

## 2016-07-07 NOTE — Telephone Encounter (Signed)
Pt takes Xarelto for afib with CHADS2 score of 2 (CHF and age) and CHADS2VASC score of 35 (age x2, CAD, CHF, sex). Ok to hold Xarelto for 2 days as requested and resume within 24 hours after procedure.

## 2016-07-07 NOTE — Telephone Encounter (Signed)
Follow up    Dr. Lyda Jester office is calling to follow up on this. She said they close today at 5 and will be closed tomorrow for holiday.

## 2016-07-07 NOTE — Telephone Encounter (Signed)
Message faxed to Dr. Lyda Jester at [phone]- confirmation received.

## 2016-07-12 DIAGNOSIS — Z86010 Personal history of colonic polyps: Secondary | ICD-10-CM | POA: Diagnosis not present

## 2016-07-12 DIAGNOSIS — D649 Anemia, unspecified: Secondary | ICD-10-CM | POA: Diagnosis not present

## 2016-07-12 DIAGNOSIS — Z79899 Other long term (current) drug therapy: Secondary | ICD-10-CM | POA: Diagnosis not present

## 2016-07-12 DIAGNOSIS — K208 Other esophagitis: Secondary | ICD-10-CM | POA: Diagnosis not present

## 2016-07-12 DIAGNOSIS — Z87442 Personal history of urinary calculi: Secondary | ICD-10-CM | POA: Diagnosis not present

## 2016-07-12 DIAGNOSIS — K449 Diaphragmatic hernia without obstruction or gangrene: Secondary | ICD-10-CM | POA: Diagnosis not present

## 2016-07-12 DIAGNOSIS — I4891 Unspecified atrial fibrillation: Secondary | ICD-10-CM | POA: Diagnosis not present

## 2016-07-12 DIAGNOSIS — Z800 Family history of malignant neoplasm of digestive organs: Secondary | ICD-10-CM | POA: Diagnosis not present

## 2016-07-12 DIAGNOSIS — R1013 Epigastric pain: Secondary | ICD-10-CM | POA: Diagnosis not present

## 2016-07-12 DIAGNOSIS — Z853 Personal history of malignant neoplasm of breast: Secondary | ICD-10-CM | POA: Diagnosis not present

## 2016-07-12 DIAGNOSIS — K317 Polyp of stomach and duodenum: Secondary | ICD-10-CM | POA: Diagnosis not present

## 2016-07-13 DIAGNOSIS — R1012 Left upper quadrant pain: Secondary | ICD-10-CM | POA: Diagnosis not present

## 2016-07-13 DIAGNOSIS — R1011 Right upper quadrant pain: Secondary | ICD-10-CM | POA: Diagnosis not present

## 2016-07-13 DIAGNOSIS — R109 Unspecified abdominal pain: Secondary | ICD-10-CM | POA: Diagnosis not present

## 2016-07-28 DIAGNOSIS — I872 Venous insufficiency (chronic) (peripheral): Secondary | ICD-10-CM | POA: Diagnosis not present

## 2016-07-28 DIAGNOSIS — E119 Type 2 diabetes mellitus without complications: Secondary | ICD-10-CM | POA: Diagnosis not present

## 2016-07-28 DIAGNOSIS — Z6829 Body mass index (BMI) 29.0-29.9, adult: Secondary | ICD-10-CM | POA: Diagnosis not present

## 2016-07-28 DIAGNOSIS — Z79899 Other long term (current) drug therapy: Secondary | ICD-10-CM | POA: Diagnosis not present

## 2016-07-28 DIAGNOSIS — Z139 Encounter for screening, unspecified: Secondary | ICD-10-CM | POA: Diagnosis not present

## 2016-07-28 DIAGNOSIS — Z23 Encounter for immunization: Secondary | ICD-10-CM | POA: Diagnosis not present

## 2016-07-28 DIAGNOSIS — E785 Hyperlipidemia, unspecified: Secondary | ICD-10-CM | POA: Diagnosis not present

## 2016-07-28 DIAGNOSIS — I480 Paroxysmal atrial fibrillation: Secondary | ICD-10-CM | POA: Diagnosis not present

## 2016-07-28 DIAGNOSIS — M810 Age-related osteoporosis without current pathological fracture: Secondary | ICD-10-CM | POA: Diagnosis not present

## 2016-08-03 ENCOUNTER — Encounter (HOSPITAL_COMMUNITY): Payer: PPO

## 2016-08-03 ENCOUNTER — Ambulatory Visit: Payer: PPO | Admitting: Vascular Surgery

## 2016-08-08 DIAGNOSIS — J208 Acute bronchitis due to other specified organisms: Secondary | ICD-10-CM | POA: Diagnosis not present

## 2016-08-08 DIAGNOSIS — J019 Acute sinusitis, unspecified: Secondary | ICD-10-CM | POA: Diagnosis not present

## 2016-08-08 DIAGNOSIS — Z6829 Body mass index (BMI) 29.0-29.9, adult: Secondary | ICD-10-CM | POA: Diagnosis not present

## 2016-08-10 ENCOUNTER — Ambulatory Visit: Payer: PPO | Admitting: Vascular Surgery

## 2016-08-11 DIAGNOSIS — R1013 Epigastric pain: Secondary | ICD-10-CM | POA: Diagnosis not present

## 2016-08-11 DIAGNOSIS — K317 Polyp of stomach and duodenum: Secondary | ICD-10-CM | POA: Diagnosis not present

## 2016-08-15 ENCOUNTER — Other Ambulatory Visit: Payer: Self-pay | Admitting: *Deleted

## 2016-08-15 ENCOUNTER — Encounter: Payer: Self-pay | Admitting: Vascular Surgery

## 2016-08-15 MED ORDER — RIVAROXABAN 20 MG PO TABS: 20.0000 mg | ORAL_TABLET | Freq: Every day | ORAL | 1 refills | Status: DC

## 2016-08-24 ENCOUNTER — Encounter: Payer: Self-pay | Admitting: Vascular Surgery

## 2016-08-24 ENCOUNTER — Ambulatory Visit (INDEPENDENT_AMBULATORY_CARE_PROVIDER_SITE_OTHER): Payer: PPO | Admitting: Vascular Surgery

## 2016-08-24 ENCOUNTER — Ambulatory Visit (HOSPITAL_COMMUNITY)
Admission: RE | Admit: 2016-08-24 | Discharge: 2016-08-24 | Disposition: A | Discharge status: Home / Self Care | Payer: PPO | Source: Ambulatory Visit | Attending: Vascular Surgery | Admitting: Vascular Surgery

## 2016-08-24 VITALS — BP 128/76 | HR 63 | Temp 97.9°F | Resp 18 | Ht 63.0 in | Wt 169.2 lb

## 2016-08-24 DIAGNOSIS — I6521 Occlusion and stenosis of right carotid artery: Secondary | ICD-10-CM

## 2016-08-24 LAB — VAS US CAROTID
LCCADDIAS: 24 cm/s
LCCADSYS: 82 cm/s
LCCAPSYS: 108 cm/s
LEFT ECA DIAS: -11 cm/s
LEFT VERTEBRAL DIAS: -16 cm/s
LICADSYS: -94 cm/s
Left CCA prox dias: 21 cm/s
Left ICA dist dias: -36 cm/s
Left ICA prox dias: -11 cm/s
Left ICA prox sys: -66 cm/s
RCCADSYS: -46 cm/s
RIGHT CCA MID DIAS: 24 cm/s
RIGHT ECA DIAS: 22 cm/s
Right CCA prox dias: 18 cm/s
Right CCA prox sys: 92 cm/s

## 2016-08-24 NOTE — Progress Notes (Signed)
Patient name: Colleen Galloway MRN: [id number] DOB: [birth year] Sex: female  REASON FOR VISIT:    Follow up of carotid disease.  HPI:   Colleen Galloway is a pleasant 82 y.o. female who I last saw one year ago. I've been following a moderate right carotid stenosis. At the time of her last visit on 07/29/2015, the patient had 8 4059% right carotid stenosis with no significant stenosis on the left. She was asymptomatic. I set her up for one-year follow up visit.  Since I saw her last, she denies any history of stroke, TIAs, expressive or receptive aphasia, or amaurosis fugax.  The patient is on Xarelto because of a history of atrial fibrillation. For this reason she does not take aspirin. She is not on a statin because she had elevated liver enzymes while on a statin.  Past Medical History:  Diagnosis Date  . Allergy   . Arthritis   . Atrial fibrillation ([place])   . Breast cancer ([place]) 09/17/13   Left iNVASIVE DUCTAL,dcis  . Calculus of kidney    kidney stones  . Carotid artery disease ([place])    Followed by Dr. Scot Dock  . CHF (congestive heart failure) ([place])   . Dysrhythmia    a-fib  . Esophageal reflux   . Family history of colon cancer   . Family history of rectal cancer   . Fibrocystic disease of breast   . Insomnia, unspecified   . Lumbosacral spondylosis without myelopathy   . Orthostatic lightheadedness 10/20/2011  . Osteoarthrosis, unspecified whether generalized or localized, unspecified site   . PONV (postoperative nausea and vomiting)   . Unspecified sinusitis (chronic)   . Urinary tract infection     Family History  Problem Relation Age of Onset  . Heart disease Mother   . Asthma Mother   . Hypertension Mother   . Deep vein thrombosis Mother   . Other Mother        varicose veins  . Lung cancer Mother 1       non-smoker  . Heart disease Father   . Hypertension Father   . Stroke Father   . Heart disease Sister   . Hypertension Sister   . Hyperlipidemia Sister     . Rectal cancer Sister 39  . Heart disease Brother   . Asthma Brother   . Hypertension Brother   . Deep vein thrombosis Brother   . Diabetes Brother   . Colon cancer Other 96       maternal cousin's son; NOS  . Colon cancer Maternal Grandfather 59  . Liver disease Brother 56       Cirrhosis due to infection from gallbladder  . Melanoma Other     SOCIAL HISTORY: Social History  Substance Use Topics  . Smoking status: Never Smoker  . Smokeless tobacco: Never Used  . Alcohol use No    Allergies  Allergen Reactions  . Diltiazem Hcl     Pt states it causes her ankle swelling   . Flecainide Other (See Comments)    Sob, visual problems, swelling ankles  . Lopressor [Metoprolol Tartrate]     Severe dizziness. Irregular heartbeat   . Nebivolol Swelling    Sob, visual problems, swelling in ankles  . Sodium Pantothenate     swelling    Current Outpatient Prescriptions  Medication Sig Dispense Refill  . acetaminophen (TYLENOL) 500 MG tablet Take 500 mg by mouth every 6 (six) hours as needed.    Marland Kitchen anastrozole (  ARIMIDEX) 1 MG tablet Take 1 mg by mouth daily.    . calcium citrate-vitamin D (CITRACAL+D) 315-200 MG-UNIT per tablet Take 1 tablet by mouth daily.     . chlorthalidone (HYGROTON) 25 MG tablet Take 25 mg by mouth daily as needed (for fluid).     Marland Kitchen doxycycline (ADOXA) 50 MG tablet Take 50 mg by mouth daily as needed (for rosacea).     . meclizine (ANTIVERT) 25 MG tablet Take 1 tablet (25 mg total) by mouth 3 (three) times daily as needed for dizziness. 90 tablet 2  . meloxicam (MOBIC) 15 MG tablet Take 15 mg by mouth daily as needed for pain.     . metoprolol succinate (TOPROL-XL) 25 MG 24 hr tablet Take 1 tablet (25 mg total) by mouth daily. 90 tablet 1  . Multiple Vitamin (MULTIVITAMIN) capsule Take 1 capsule by mouth daily.      Marland Kitchen omeprazole (PRILOSEC) 20 MG capsule Take 20 mg by mouth daily.      . rivaroxaban (XARELTO) 20 MG TABS tablet Take 1 tablet (20 mg total) by  mouth daily with supper. 90 tablet 1  . traZODone (DESYREL) 50 MG tablet Take 50-100 mg by mouth daily as needed for sleep.     . Vaginal Lubricant (REPLENS) GEL Place 1 Applicatorful vaginally daily as needed (for dryness). Reported on 07/29/2015     No current facility-administered medications for this visit.     REVIEW OF SYSTEMS:  [X]  denotes positive finding, [ ]  denotes negative finding Cardiac  Comments:  Chest pain or chest pressure:    Shortness of breath upon exertion:    Short of breath when lying flat:    Irregular heart rhythm:        Vascular    Pain in calf, thigh, or hip brought on by ambulation:    Pain in feet at night that wakes you up from your sleep:     Blood clot in your veins:    Leg swelling:         Pulmonary    Oxygen at home:    Productive cough:     Wheezing:         Neurologic    Sudden weakness in arms or legs:     Sudden numbness in arms or legs:     Sudden onset of difficulty speaking or slurred speech:    Temporary loss of vision in one eye:     Problems with dizziness:         Gastrointestinal    Blood in stool:     Vomited blood:         Genitourinary    Burning when urinating:     Blood in urine:        Psychiatric    Major depression:         Hematologic    Bleeding problems:    Problems with blood clotting too easily:        Skin    Rashes or ulcers:        Constitutional    Fever or chills:     PHYSICAL EXAM:   Vitals:   08/24/16 1249 08/24/16 1250  BP: 131/79 128/76  Pulse: 63   Resp: 18   Temp: 97.9 F (36.6 C)   TempSrc: Oral   SpO2: 100%   Weight: 169 lb 3.2 oz (76.7 kg)   Height: 5\' 3"  (1.6 m)     GENERAL: The patient is a well-nourished female,  in no acute distress. The vital signs are documented above. CARDIAC: There is a regular rate and rhythm.  VASCULAR: I do not detect carotid bruits. She has palpable posterior tibial pulses bilaterally. She has telangiectasias and spider veins  bilaterally. PULMONARY: There is good air exchange bilaterally without wheezing or rales. ABDOMEN: Soft and non-tender with normal pitched bowel sounds.  MUSCULOSKELETAL: There are no major deformities or cyanosis. NEUROLOGIC: No focal weakness or paresthesias are detected. SKIN: There are no ulcers or rashes noted. PSYCHIATRIC: The patient has a normal affect.  DATA:    CAROTID DUPLEX: I have independently interpreted her carotid duplex scan.  On the right side there is a 40-59% carotid stenosis. This is not changed in the last year.  On the left side there is a less than 39% stenosis.  Both vertebral arteries are patent with antegrade flow.  MEDICAL ISSUES:   40-59% ASYMPTOMATIC RIGHT CAROTID STENOSIS: This patient has in a systematic 40-59% right carotid stenosis. She understands we would not consider carotid endarterectomy unless the stenosis became symptomatic or progressed to greater than 80%. I have ordered a follow up carotid duplex scan in 1 year and I'll see her back at that time. She knows to call sooner if she has problems. She is not a smoker.  Deitra Galloway [facility] [phone]

## 2016-09-12 NOTE — Addendum Note (Signed)
Addended by: Lianne Cure A on: 09/12/2016 03:21 PM   Modules accepted: Orders

## 2016-10-11 DIAGNOSIS — Z01419 Encounter for gynecological examination (general) (routine) without abnormal findings: Secondary | ICD-10-CM | POA: Diagnosis not present

## 2016-10-24 DIAGNOSIS — H2513 Age-related nuclear cataract, bilateral: Secondary | ICD-10-CM | POA: Diagnosis not present

## 2016-10-24 DIAGNOSIS — R7303 Prediabetes: Secondary | ICD-10-CM | POA: Diagnosis not present

## 2016-10-28 ENCOUNTER — Encounter: Payer: Self-pay | Admitting: Internal Medicine

## 2016-11-01 DIAGNOSIS — R921 Mammographic calcification found on diagnostic imaging of breast: Secondary | ICD-10-CM | POA: Diagnosis not present

## 2016-11-01 DIAGNOSIS — Z853 Personal history of malignant neoplasm of breast: Secondary | ICD-10-CM | POA: Diagnosis not present

## 2016-11-11 ENCOUNTER — Encounter: Payer: Self-pay | Admitting: Internal Medicine

## 2016-11-11 ENCOUNTER — Ambulatory Visit (INDEPENDENT_AMBULATORY_CARE_PROVIDER_SITE_OTHER): Payer: PPO | Admitting: Internal Medicine

## 2016-11-11 VITALS — BP 126/80 | HR 91 | Ht 63.0 in | Wt 174.0 lb

## 2016-11-11 DIAGNOSIS — I480 Paroxysmal atrial fibrillation: Secondary | ICD-10-CM

## 2016-11-11 DIAGNOSIS — I10 Essential (primary) hypertension: Secondary | ICD-10-CM | POA: Diagnosis not present

## 2016-11-11 MED ORDER — RIVAROXABAN 20 MG PO TABS: 20.0000 mg | ORAL_TABLET | Freq: Every day | ORAL | 0 refills | Status: DC

## 2016-11-11 NOTE — Progress Notes (Signed)
Patient Care Team: Nicoletta Dress, MD as PCP - General (Internal Medicine) Deboraha Sprang, MD as Attending Physician (Cardiology) Nicholas Lose, MD as Consulting Physician (Hematology and Oncology)   HPI  Colleen Galloway is a 82 y.o. female Seen in followup for atrial fibrillation for which she is anticoagulated. She had problems with edema on calcium channel blockers and has been managed with beta blockers.  She was diagnosed with breast cancer and underwent partial mastectomy, chemotherapy and radiation therapy. She has now completed treatment.   Her atrial fibrillation has been more problematic of late. She is having no bleeding issues on her Xarelto.  She had a mammogram recently. She's had pain in her left arm since. There is swelling just proximal to the antecubital fossa    Echocardiogram 3/16 was normal there was minimal left atrial enlargement and minimal  pulmonary artery pressure elevation--40   Date Cr Hgb  6/18 0.76 13.5             Past Medical History:  Diagnosis Date  . Allergy   . Arthritis   . Atrial fibrillation ([place])   . Breast cancer ([place]) 09/17/13   Left iNVASIVE DUCTAL,dcis  . Calculus of kidney    kidney stones  . Carotid artery disease ([place])    Followed by Dr. Scot Dock  . CHF (congestive heart failure) ([place])   . Dysrhythmia    a-fib  . Esophageal reflux   . Family history of colon cancer   . Family history of rectal cancer   . Fibrocystic disease of breast   . Insomnia, unspecified   . Lumbosacral spondylosis without myelopathy   . Orthostatic lightheadedness 10/20/2011  . Osteoarthrosis, unspecified whether generalized or localized, unspecified site   . PONV (postoperative nausea and vomiting)   . Unspecified sinusitis (chronic)   . Urinary tract infection     Past Surgical History:  Procedure Laterality Date  . BREAST LUMPECTOMY WITH NEEDLE LOCALIZATION AND AXILLARY SENTINEL LYMPH NODE BX Left 10/18/2013   Procedure: LEFT  AXILLARY LYMPHATIC MAPPING; INJECTION OF METHYLENE BLUE INTO LEFT BREAST; LEFT BREAST PARTIAL MASTECTOMY AFTER NEEDLE LOCALIZATION; AXILLARY SENTINEL LYMPH NODE BIOPSY;  Surgeon: Jackolyn Confer, MD;  Location: [facility];  Service: General;  Laterality: Left;  . DILATION AND CURETTAGE OF UTERUS  1960  . KIDNEY STONE SURGERY    . PORT-A-CATH REMOVAL Right 12/26/2014   Procedure: REMOVAL PORT-A-CATH;  Surgeon: Jackolyn Confer, MD;  Location: [facility];  Service: General;  Laterality: Right;  . PORTACATH PLACEMENT N/A 10/18/2013   Procedure: INSERTION PORT-A-CATH/ULTRASOUND GUIDED,RIGHT INTERNAL JUGULAR;  Surgeon: Jackolyn Confer, MD;  Location: [facility];  Service: General;  Laterality: N/A;  . varicose veins      Current Outpatient Prescriptions  Medication Sig Dispense Refill  . acetaminophen (TYLENOL) 500 MG tablet Take 500 mg by mouth every 6 (six) hours as needed.    Marland Kitchen anastrozole (ARIMIDEX) 1 MG tablet Take 1 mg by mouth daily.    . calcium citrate-vitamin D (CITRACAL+D) 315-200 MG-UNIT per tablet Take 1 tablet by mouth daily.     . chlorthalidone (HYGROTON) 25 MG tablet Take 25 mg by mouth daily as needed (for fluid).     Marland Kitchen doxycycline (ADOXA) 50 MG tablet Take 50 mg by mouth daily as needed (for rosacea).     . meclizine (ANTIVERT) 25 MG tablet Take 1 tablet (25 mg total) by mouth 3 (three) times daily as needed for dizziness. 90 tablet 2  .  metoprolol succinate (TOPROL-XL) 25 MG 24 hr tablet Take 1 tablet (25 mg total) by mouth daily. 90 tablet 1  . Multiple Vitamin (MULTIVITAMIN) capsule Take 1 capsule by mouth daily.      Marland Kitchen omeprazole (PRILOSEC) 20 MG capsule Take 20 mg by mouth daily.      . rivaroxaban (XARELTO) 20 MG TABS tablet Take 1 tablet (20 mg total) by mouth daily with supper. 90 tablet 1  . Vaginal Lubricant (REPLENS) GEL Place 1 Applicatorful vaginally daily as needed (for dryness). Reported on 07/29/2015     No current facility-administered medications for this  visit.     Allergies  Allergen Reactions  . Diltiazem Hcl     Pt states it causes her ankle swelling   . Flecainide Other (See Comments)    Sob, visual problems, swelling ankles  . Lopressor [Metoprolol Tartrate]     Severe dizziness. Irregular heartbeat   . Nebivolol Swelling    Sob, visual problems, swelling in ankles  . Sodium Pantothenate     swelling    Review of Systems negative except from HPI and PMH  Physical Exam BP 126/80   Pulse 91   Ht 5\' 3"  (1.6 m)   Wt 174 lb (78.9 kg)   SpO2 98%   BMI 30.82 kg/m  Well developed and nourished in no acute distress HENT normal Neck supple with JVP-flat Carotids brisk and full without bruits Clear Regular rate and rhythm, no murmurs or gallops Abd-soft with active BS without hepatomegaly No Clubbing cyanosis edema  Tender swelling of L distal arm Skin-warm and dry A & Oriented  Grossly normal sensory and motor function   ECG demonstrates sinus rhythm 70 15/07/39 Nonspecific ST-T changes     Assessment and  Plan  Atrial fibrillation-paroxysmal  Hypertension    Breast Cancer-therapy over  Left arm swelling   Low pressure is well-controlled  On Anticoagulation;  No bleeding issues   Some intercurrent atrial fibrillation of relatively brief duration. She thinks that stress may be a trigger.  The swelling in her left arm might have the thrombophlebitis. I'm not sure. However, on anticoagulation therapy the other indicated therapy would be compresses and raising the arm. We have discussed these.   Anticoagulation labs were normal with PCP As above

## 2016-11-11 NOTE — Patient Instructions (Signed)

## 2016-11-21 DIAGNOSIS — R55 Syncope and collapse: Secondary | ICD-10-CM | POA: Diagnosis not present

## 2016-11-21 DIAGNOSIS — I482 Chronic atrial fibrillation: Secondary | ICD-10-CM | POA: Diagnosis not present

## 2016-11-21 DIAGNOSIS — R079 Chest pain, unspecified: Secondary | ICD-10-CM | POA: Diagnosis not present

## 2016-12-07 NOTE — Progress Notes (Signed)
Electrophysiology Office Note Date: 12/09/2016  ID:  NATAUSHA Galloway, DOB [birth year], MRN [id number]  PCP: Colleen Dress, MD Electrophysiologist: Colleen Galloway  CC: hospital follow up  Colleen Galloway is a 82 y.o. female seen today for Dr Colleen Galloway.  She presents today for routine electrophysiology followup.  Since last being seen in our clinic, the patient reports doing reasonably well.  She was recently admitted to [facility] for an episode of AF that was associated with pre-syncope, diaphoresis and nausea. She was asked to follow up today. In the last 2 weeks since she was admitted, she has done reasonably well.  She continues to have intermittent AF episodes that are not as symptomatic as the one she went to the ER for.  She denies chest pain, palpitations, dyspnea, PND, orthopnea, nausea, vomiting, dizziness, syncope, edema, weight gain, or early satiety.  Past Medical History:  Diagnosis Date  . Allergy   . Arthritis   . Atrial fibrillation ([place])   . Breast cancer ([place]) 09/17/13   Left iNVASIVE DUCTAL,dcis  . Calculus of kidney    kidney stones  . Carotid artery disease ([place])    Followed by Dr. Scot Dock  . CHF (congestive heart failure) ([place])   . Dysrhythmia    a-fib  . Esophageal reflux   . Family history of colon cancer   . Family history of rectal cancer   . Fibrocystic disease of breast   . Insomnia, unspecified   . Lumbosacral spondylosis without myelopathy   . Orthostatic lightheadedness 10/20/2011  . Osteoarthrosis, unspecified whether generalized or localized, unspecified site   . PONV (postoperative nausea and vomiting)   . Unspecified sinusitis (chronic)   . Urinary tract infection    Past Surgical History:  Procedure Laterality Date  . BREAST LUMPECTOMY WITH NEEDLE LOCALIZATION AND AXILLARY SENTINEL LYMPH NODE BX Left 10/18/2013   Procedure: LEFT AXILLARY LYMPHATIC MAPPING; INJECTION OF METHYLENE BLUE INTO LEFT BREAST; LEFT BREAST PARTIAL MASTECTOMY AFTER NEEDLE  LOCALIZATION; AXILLARY SENTINEL LYMPH NODE BIOPSY;  Surgeon: Jackolyn Confer, MD;  Location: [facility];  Service: General;  Laterality: Left;  . DILATION AND CURETTAGE OF UTERUS  1960  . KIDNEY STONE SURGERY    . PORT-A-CATH REMOVAL Right 12/26/2014   Procedure: REMOVAL PORT-A-CATH;  Surgeon: Jackolyn Confer, MD;  Location: [facility];  Service: General;  Laterality: Right;  . PORTACATH PLACEMENT N/A 10/18/2013   Procedure: INSERTION PORT-A-CATH/ULTRASOUND GUIDED,RIGHT INTERNAL JUGULAR;  Surgeon: Jackolyn Confer, MD;  Location: [facility];  Service: General;  Laterality: N/A;  . varicose veins      Current Outpatient Prescriptions  Medication Sig Dispense Refill  . acetaminophen (TYLENOL) 500 MG tablet Take 500 mg by mouth every 6 (six) hours as needed.    Marland Kitchen anastrozole (ARIMIDEX) 1 MG tablet Take 1 mg by mouth daily.    . calcium citrate-vitamin D (CITRACAL+D) 315-200 MG-UNIT per tablet Take 1 tablet by mouth daily.     . chlorthalidone (HYGROTON) 25 MG tablet Take 25 mg by mouth daily as needed (for fluid).     Marland Kitchen doxycycline (ADOXA) 50 MG tablet Take 50 mg by mouth daily as needed (for rosacea).     . meclizine (ANTIVERT) 25 MG tablet Take 1 tablet (25 mg total) by mouth 3 (three) times daily as needed for dizziness. 90 tablet 2  . metoprolol succinate (TOPROL-XL) 25 MG 24 hr tablet Take 1 tablet (25 mg total) by mouth 2 (two) times daily. 60 tablet 3  . Multiple  Vitamin (MULTIVITAMIN) capsule Take 1 capsule by mouth daily.      Marland Kitchen omeprazole (PRILOSEC) 20 MG capsule Take 20 mg by mouth daily.      . rivaroxaban (XARELTO) 20 MG TABS tablet Take 1 tablet (20 mg total) by mouth daily with supper. 30 tablet 0  . Vaginal Lubricant (REPLENS) GEL Place 1 Applicatorful vaginally daily as needed (for dryness). Reported on 07/29/2015     No current facility-administered medications for this visit.     Allergies:   Diltiazem hcl; Flecainide; Lopressor [metoprolol tartrate]; Nebivolol; and Sodium  pantothenate   Social History: Social History   Social History  . Marital status: Widowed    Spouse name: N/A  . Number of children: N/A  . Years of education: N/A   Occupational History  . Not on file.   Social History Main Topics  . Smoking status: Never Smoker  . Smokeless tobacco: Never Used  . Alcohol use No  . Drug use: No  . Sexual activity: Not on file   Other Topics Concern  . Not on file   Social History Narrative   Non smoker/ no tobacco use   Current work/retired   Martial status/widowed   Non drinker/ no alcohol use   Seat belt use/ always wears seatbelt   Caffeine use   Guns in the home          Family History: Family History  Problem Relation Age of Onset  . Heart disease Mother   . Asthma Mother   . Hypertension Mother   . Deep vein thrombosis Mother   . Other Mother        varicose veins  . Lung cancer Mother 23       non-smoker  . Heart disease Father   . Hypertension Father   . Stroke Father   . Heart disease Sister   . Hypertension Sister   . Hyperlipidemia Sister   . Rectal cancer Sister 59  . Heart disease Brother   . Asthma Brother   . Hypertension Brother   . Deep vein thrombosis Brother   . Diabetes Brother   . Colon cancer Other 68       maternal cousin's son; NOS  . Colon cancer Maternal Grandfather 61  . Liver disease Brother 58       Cirrhosis due to infection from gallbladder  . Melanoma Other     Review of Systems: All other systems reviewed and are otherwise negative except as noted above.   Physical Exam: VS:  BP (!) 148/92   Pulse 79   Ht 5\' 3"  (1.6 m)   Wt 176 lb (79.8 kg)   BMI 31.18 kg/m  , BMI Body mass index is 31.18 kg/m. Wt Readings from Last 3 Encounters:  12/09/16 176 lb (79.8 kg)  11/11/16 174 lb (78.9 kg)  08/24/16 169 lb 3.2 oz (76.7 kg)    GEN- The patient is well appearing, alert and oriented x 3 today.   HEENT: normocephalic, atraumatic; sclera clear, conjunctiva pink; hearing  intact; oropharynx clear; neck supple  Lungs- Clear to ausculation bilaterally, normal work of breathing.  No wheezes, rales, rhonchi Heart- Regular rate and rhythm  GI- soft, non-tender, non-distended, bowel sounds present  Extremities- no clubbing, cyanosis, or edema  MS- no significant deformity or atrophy Skin- warm and dry, no rash or lesion  Psych- euthymic mood, full affect Neuro- strength and sensation are intact   EKG:  EKG is ordered today. The ekg ordered  today shows sinus rhythm with marked sinus arrhythmia, rate 79  Recent Labs: No results found for requested labs within last 8760 hours.    Other studies Reviewed: Additional studies/ records that were reviewed today include: Dr Olin Pia office notes  Assessment and Plan:  1.  Paroxysmal atrial fibrillation Increased burden recently Will update echo Increase Metoprolol to twice daily. May need to consider AAD therapy  Continue Xarelto for [facility] of 3  2.  HTN Stable No change required today    Current medicines are reviewed at length with the patient today.   The patient does not have concerns regarding her medicines.  The following changes were made today:  Increase metoprolol to twice daily  Labs/ tests ordered today include: none Orders Placed This Encounter  Procedures  . EKG 12-Lead  . ECHOCARDIOGRAM COMPLETE     Disposition:   Follow up with Dr Colleen Galloway in 3 months     Signed, Chanetta Marshall, NP 12/09/2016 3:16 PM   [address] [place] [address] [phone] (office) [phone] (fax)

## 2016-12-09 ENCOUNTER — Encounter: Payer: Self-pay | Admitting: Nurse Practitioner

## 2016-12-09 ENCOUNTER — Ambulatory Visit (INDEPENDENT_AMBULATORY_CARE_PROVIDER_SITE_OTHER): Payer: PPO | Admitting: Nurse Practitioner

## 2016-12-09 VITALS — BP 148/92 | HR 79 | Ht 63.0 in | Wt 176.0 lb

## 2016-12-09 DIAGNOSIS — I4891 Unspecified atrial fibrillation: Secondary | ICD-10-CM | POA: Diagnosis not present

## 2016-12-09 DIAGNOSIS — I480 Paroxysmal atrial fibrillation: Secondary | ICD-10-CM

## 2016-12-09 DIAGNOSIS — I5032 Chronic diastolic (congestive) heart failure: Secondary | ICD-10-CM

## 2016-12-09 MED ORDER — METOPROLOL SUCCINATE ER 25 MG PO TB24: 25.0000 mg | ORAL_TABLET | Freq: Two times a day (BID) | ORAL | 3 refills | Status: DC

## 2016-12-09 NOTE — Patient Instructions (Addendum)
Medication Instruction:  START TAKING METOPROLOL TWICE A DAY   If you need a refill on your cardiac medications before your next appointment, please call your pharmacy.   Labwork: NONE ORDERED  TODAY    Testing/Procedures: Your physician has requested that you have an echocardiogram. Echocardiography is a painless test that uses sound waves to create images of your heart. It provides your doctor with information about the size and shape of your heart and how well your heart's chambers and valves are working. This procedure takes approximately one hour. There are no restrictions for this procedure.      Follow-Up: AS SCHEDULED WITH KLEIN    Any Other Special Instructions Will Be Listed Below (If Applicable).

## 2016-12-12 ENCOUNTER — Other Ambulatory Visit: Payer: Self-pay

## 2016-12-12 ENCOUNTER — Ambulatory Visit (HOSPITAL_COMMUNITY): Payer: PPO | Attending: Cardiology

## 2016-12-12 DIAGNOSIS — I4891 Unspecified atrial fibrillation: Secondary | ICD-10-CM | POA: Diagnosis not present

## 2016-12-12 DIAGNOSIS — I6529 Occlusion and stenosis of unspecified carotid artery: Secondary | ICD-10-CM | POA: Diagnosis not present

## 2016-12-12 DIAGNOSIS — E785 Hyperlipidemia, unspecified: Secondary | ICD-10-CM | POA: Diagnosis not present

## 2016-12-12 DIAGNOSIS — I071 Rheumatic tricuspid insufficiency: Secondary | ICD-10-CM | POA: Diagnosis not present

## 2016-12-12 DIAGNOSIS — I272 Pulmonary hypertension, unspecified: Secondary | ICD-10-CM | POA: Diagnosis not present

## 2016-12-12 DIAGNOSIS — Z853 Personal history of malignant neoplasm of breast: Secondary | ICD-10-CM | POA: Insufficient documentation

## 2016-12-14 ENCOUNTER — Other Ambulatory Visit: Payer: Self-pay

## 2016-12-14 DIAGNOSIS — R400 Somnolence: Secondary | ICD-10-CM

## 2016-12-23 ENCOUNTER — Telehealth: Payer: Self-pay | Admitting: *Deleted

## 2016-12-23 NOTE — Telephone Encounter (Signed)
-----   Message from Earnestine Mealing sent at 12/14/2016  2:23 PM EDT ----- Regarding: sleep study  Ola Spurr, I have made the appt with Dr Caryl Comes in [place].  I am forwarding the message to you for the sleep study.  Thanks ----- Message ----- From: Luanna Salk, LPN Sent: [id number]   9:22 AM To: Windy Fast [facility]  Amber Lynnell Jude wants patient to have a sleep study and follow up appt with Dr.Klein in January    Thanks

## 2016-12-23 NOTE — Telephone Encounter (Signed)
Informed patient of upcoming sleep study and patient understanding was verbalized. Patient understands her sleep study is scheduled for Friday January 27 2017. Patient understands her sleep study will be done at [facility] sleep lab. Patient understands she will receive a sleep packet in a week or so. Patient understands to call if she does not receive the sleep packet in a timely manner. Patient agrees with treatment but declined to have her sleep study this year because her part of the insurance is $194 and she can get a home sleep study next year paid in full by insurance. Patient thanked me for the call.

## 2016-12-26 NOTE — Telephone Encounter (Signed)
Patient has declined her sleep study. [facility] notified.

## 2017-01-27 ENCOUNTER — Encounter (HOSPITAL_BASED_OUTPATIENT_CLINIC_OR_DEPARTMENT_OTHER): Payer: PPO

## 2017-01-27 DIAGNOSIS — E119 Type 2 diabetes mellitus without complications: Secondary | ICD-10-CM | POA: Diagnosis not present

## 2017-01-27 DIAGNOSIS — I872 Venous insufficiency (chronic) (peripheral): Secondary | ICD-10-CM | POA: Diagnosis not present

## 2017-01-27 DIAGNOSIS — E785 Hyperlipidemia, unspecified: Secondary | ICD-10-CM | POA: Diagnosis not present

## 2017-01-27 DIAGNOSIS — Z79899 Other long term (current) drug therapy: Secondary | ICD-10-CM | POA: Diagnosis not present

## 2017-01-27 DIAGNOSIS — M810 Age-related osteoporosis without current pathological fracture: Secondary | ICD-10-CM | POA: Diagnosis not present

## 2017-01-27 DIAGNOSIS — Z9181 History of falling: Secondary | ICD-10-CM | POA: Diagnosis not present

## 2017-01-27 DIAGNOSIS — I480 Paroxysmal atrial fibrillation: Secondary | ICD-10-CM | POA: Diagnosis not present

## 2017-01-27 DIAGNOSIS — Z6830 Body mass index (BMI) 30.0-30.9, adult: Secondary | ICD-10-CM | POA: Diagnosis not present

## 2017-01-27 DIAGNOSIS — Z1331 Encounter for screening for depression: Secondary | ICD-10-CM | POA: Diagnosis not present

## 2017-02-23 DIAGNOSIS — Z923 Personal history of irradiation: Secondary | ICD-10-CM | POA: Diagnosis not present

## 2017-02-23 DIAGNOSIS — Z853 Personal history of malignant neoplasm of breast: Secondary | ICD-10-CM | POA: Diagnosis not present

## 2017-02-23 DIAGNOSIS — Z79811 Long term (current) use of aromatase inhibitors: Secondary | ICD-10-CM | POA: Diagnosis not present

## 2017-02-23 DIAGNOSIS — Z170 Estrogen receptor positive status [ER+]: Secondary | ICD-10-CM | POA: Diagnosis not present

## 2017-02-23 DIAGNOSIS — M810 Age-related osteoporosis without current pathological fracture: Secondary | ICD-10-CM | POA: Diagnosis not present

## 2017-02-23 DIAGNOSIS — C50912 Malignant neoplasm of unspecified site of left female breast: Secondary | ICD-10-CM | POA: Diagnosis not present

## 2017-02-23 DIAGNOSIS — Z9221 Personal history of antineoplastic chemotherapy: Secondary | ICD-10-CM | POA: Diagnosis not present

## 2017-03-07 DIAGNOSIS — J208 Acute bronchitis due to other specified organisms: Secondary | ICD-10-CM | POA: Diagnosis not present

## 2017-03-07 DIAGNOSIS — E785 Hyperlipidemia, unspecified: Secondary | ICD-10-CM | POA: Diagnosis not present

## 2017-03-07 DIAGNOSIS — Z6830 Body mass index (BMI) 30.0-30.9, adult: Secondary | ICD-10-CM | POA: Diagnosis not present

## 2017-03-07 DIAGNOSIS — R350 Frequency of micturition: Secondary | ICD-10-CM | POA: Diagnosis not present

## 2017-03-13 DIAGNOSIS — D485 Neoplasm of uncertain behavior of skin: Secondary | ICD-10-CM | POA: Diagnosis not present

## 2017-03-13 DIAGNOSIS — L719 Rosacea, unspecified: Secondary | ICD-10-CM | POA: Diagnosis not present

## 2017-03-13 DIAGNOSIS — L821 Other seborrheic keratosis: Secondary | ICD-10-CM | POA: Diagnosis not present

## 2017-03-14 ENCOUNTER — Ambulatory Visit: Payer: PPO | Admitting: Internal Medicine

## 2017-03-14 ENCOUNTER — Encounter: Payer: Self-pay | Admitting: Internal Medicine

## 2017-03-14 VITALS — BP 126/86 | HR 123 | Ht 63.0 in | Wt 172.0 lb

## 2017-03-14 DIAGNOSIS — I480 Paroxysmal atrial fibrillation: Secondary | ICD-10-CM

## 2017-03-14 DIAGNOSIS — R42 Dizziness and giddiness: Secondary | ICD-10-CM | POA: Diagnosis not present

## 2017-03-14 DIAGNOSIS — I4891 Unspecified atrial fibrillation: Secondary | ICD-10-CM | POA: Diagnosis not present

## 2017-03-14 DIAGNOSIS — I5032 Chronic diastolic (congestive) heart failure: Secondary | ICD-10-CM | POA: Diagnosis not present

## 2017-03-14 MED ORDER — CHLORTHALIDONE 25 MG PO TABS: 25.0000 mg | ORAL_TABLET | Freq: Every day | ORAL | 0 refills | Status: DC | PRN

## 2017-03-14 NOTE — Progress Notes (Signed)
Patient Care Team: Nicoletta Dress, MD as PCP - General (Internal Medicine) Deboraha Sprang, MD as Attending Physician (Cardiology) Nicholas Lose, MD as Consulting Physician (Hematology and Oncology)   HPI  Colleen Galloway is a 83 y.o. female Seen in followup for atrial fibrillation for which she is anticoagulated. She had problems with edema on calcium channel blockers and has been managed with beta blockers.  She was diagnosed with breast cancer and underwent partial mastectomy, chemotherapy and radiation therapy. She has now completed treatment.   Her atrial fibrillation has been more problematic of late. She is having no bleeding issues on her Xarelto.  She had a mammogram recently. She's had pain in her left arm since. There is swelling just proximal to the antecubital fossa    Echocardiogram 3/16 was normal there was minimal left atrial enlargement and minimal  pulmonary artery pressure elevation--40   Date Cr Hgb  6/18 0.76 13.5         She was seen 10/18 in [place] and found to be in atrial fibrillation.  She was subsequently seen by [facility]-NP  Echocardiogram was obtained as noted below she is concerned about pulmonary hypertension.  Reviewing echoes back to 2013 she has had PA pressures 30-40 throughout.  She has mild dyspnea.  She has had no edema.  She has occasional tachypalpitations.  She is on blood thinner without bleeding.  She denies symptoms of sleep disordered breathing or daytime somnolence. DATE TEST    3/16 Echo    EF 55-60 % PA sys 38  10/18 Echo    EF 55 %  PA Sys 50          Past Medical History:  Diagnosis Date  . Allergy   . Arthritis   . Atrial fibrillation ([place])   . Breast cancer ([place]) 09/17/13   Left iNVASIVE DUCTAL,dcis  . Calculus of kidney    kidney stones  . Carotid artery disease ([place])    Followed by Dr. Scot Dock  . CHF (congestive heart failure) ([employer])   . Dysrhythmia    a-fib  . Esophageal reflux   . Family history of  colon cancer   . Family history of rectal cancer   . Fibrocystic disease of breast   . Insomnia, unspecified   . Lumbosacral spondylosis without myelopathy   . Orthostatic lightheadedness 10/20/2011  . Osteoarthrosis, unspecified whether generalized or localized, unspecified site   . PONV (postoperative nausea and vomiting)   . Unspecified sinusitis (chronic)   . Urinary tract infection     Past Surgical History:  Procedure Laterality Date  . BREAST LUMPECTOMY WITH NEEDLE LOCALIZATION AND AXILLARY SENTINEL LYMPH NODE BX Left 10/18/2013   Procedure: LEFT AXILLARY LYMPHATIC MAPPING; INJECTION OF METHYLENE BLUE INTO LEFT BREAST; LEFT BREAST PARTIAL MASTECTOMY AFTER NEEDLE LOCALIZATION; AXILLARY SENTINEL LYMPH NODE BIOPSY;  Surgeon: Jackolyn Confer, MD;  Location: [facility];  Service: General;  Laterality: Left;  . DILATION AND CURETTAGE OF UTERUS  1960  . KIDNEY STONE SURGERY    . PORT-A-CATH REMOVAL Right 12/26/2014   Procedure: REMOVAL PORT-A-CATH;  Surgeon: Jackolyn Confer, MD;  Location: [facility];  Service: General;  Laterality: Right;  . PORTACATH PLACEMENT N/A 10/18/2013   Procedure: INSERTION PORT-A-CATH/ULTRASOUND GUIDED,RIGHT INTERNAL JUGULAR;  Surgeon: Jackolyn Confer, MD;  Location: [facility];  Service: General;  Laterality: N/A;  . varicose veins      Current Outpatient Medications  Medication Sig Dispense Refill  . acetaminophen (TYLENOL) 500 MG  tablet Take 500 mg by mouth every 6 (six) hours as needed.    Marland Kitchen anastrozole (ARIMIDEX) 1 MG tablet Take 1 mg by mouth daily.    . calcium citrate-vitamin D (CITRACAL+D) 315-200 MG-UNIT per tablet Take 1 tablet by mouth daily.     . chlorthalidone (HYGROTON) 25 MG tablet Take 25 mg by mouth daily as needed (for fluid).     Marland Kitchen doxycycline (ADOXA) 50 MG tablet Take 50 mg by mouth daily as needed (for rosacea).     . fenofibrate 160 MG tablet Take 160 mg by mouth daily.    Marland Kitchen levofloxacin (LEVAQUIN) 750 MG tablet Take 750 mg by mouth  daily.    . meclizine (ANTIVERT) 25 MG tablet Take 1 tablet (25 mg total) by mouth 3 (three) times daily as needed for dizziness. 90 tablet 2  . metoprolol succinate (TOPROL-XL) 25 MG 24 hr tablet Take 1 tablet (25 mg total) by mouth 2 (two) times daily. 60 tablet 3  . Multiple Vitamin (MULTIVITAMIN) capsule Take 1 capsule by mouth daily.      Marland Kitchen omeprazole (PRILOSEC) 20 MG capsule Take 20 mg by mouth daily.      . rivaroxaban (XARELTO) 20 MG TABS tablet Take 1 tablet (20 mg total) by mouth daily with supper. 30 tablet 0  . Vaginal Lubricant (REPLENS) GEL Place 1 Applicatorful vaginally daily as needed (for dryness). Reported on 07/29/2015     No current facility-administered medications for this visit.     Allergies  Allergen Reactions  . Diltiazem Hcl     Pt states it causes her ankle swelling   . Flecainide Other (See Comments)    Sob, visual problems, swelling ankles  . Lopressor [Metoprolol Tartrate]     Severe dizziness. Irregular heartbeat   . Nebivolol Swelling    Sob, visual problems, swelling in ankles  . Sodium Pantothenate     swelling    Review of Systems negative except from HPI and PMH  Physical Exam BP 126/86   Pulse (!) 123   Ht 5\' 3"  (1.6 m)   Wt 172 lb (78 kg)   SpO2 96%   BMI 30.47 kg/m  Well developed and nourished in no acute distress HENT normal Neck supple with JVP-flat Carotids brisk and full without bruits Clear Irregularly irregular rate and rhythm with controlled ventricular response, no murmurs or gallops Abd-soft with active BS without hepatomegaly No Clubbing cyanosis edema Skin-warm and dry A & Oriented  Grossly normal sensory and motor function   ECG demonstratews atypical atrial flutter at 126 Interval-/0 7/30  Assessment and  Plan  Atrial fibrillation--flutter-persistent  Hypertension    Breast Cancer-therapy    Left arm swelling  Night sweats  Pulmonary hypertension   She is in persistent atrial flutter.  We have  discussed cardioversion.  Frequency of recurrent events and the associated symptoms were determined the role of antiarrhythmic therapy.  For now we will proceed with cardioversion.  She is taking her anticoagulation and is without bleeding.  We will need to check a metabolic profile and a CBC on her anticoagulation.  She is having night sweats which date to the hormonal therapy of her breast cancer.  She is not having nocturnal dyspnea.  She denies symptoms suggestive of sleep apnea.  Her pulmonary pressures are mildly increased We will need to plan to follow these up in the next 6 months or so although I suspect that this is long and chronic.  I was surprised that the  E/ /E' was not elevated but she does have evidence of diastolic dysfunction  More than 50% of 40 min was spent in counseling related to the above  .

## 2017-03-14 NOTE — Patient Instructions (Addendum)
Medication Instructions:  Your physician recommends that you continue on your current medications as directed. Please refer to the Current Medication list given to you today.  Labwork: Your physician recommends that you have the following lab work today: CBC BMP   Testing/Procedures: Your physician has recommended that you have a Cardioversion (DCCV). Electrical Cardioversion uses a jolt of electricity to your heart either through paddles or wired patches attached to your chest. This is a controlled, usually prescheduled, procedure. Defibrillation is done under light anesthesia in the hospital, and you usually go home the day of the procedure. This is done to get your heart back into a normal rhythm. You are not awake for the procedure. Please see the instruction sheet given to you today.    Follow-Up: Your physician recommends that you schedule a follow-up appointment in:  2 months with Chanetta Marshall, NP  Any Other Special Instructions Will Be Listed Below (If Applicable).  You are scheduled for a Cardioversion on 2/4/2019with Dr. Oval Linsey.   Please arrive at the [facility] (Main Entrance A) at [facility]:  [address] [place], [place] [address] at 1100am. (1 hour prior to procedure)  DIET: Nothing to eat or drink after midnight except a sip of water with medications (see medication instructions below)  Medication Instructions: Hold Chlorthalidone the morning of the procedure. Continue your anticoagulant: Xarelto You will need to continue your anticoagulant after your procedure until   you are told by your Provider that it is safe to stop   Labs: Today  You must have a responsible person to drive you home and stay in the waiting area during your procedure. Failure to do so could result in cancellation.  Bring your insurance cards.  *Special Note: Every effort is made to have your procedure done on time. Occasionally there are emergencies that occur at the hospital  that may cause delays. Please be patient if a delay does occur.      If you need a refill on your cardiac medications before your next appointment, please call your pharmacy.

## 2017-03-15 LAB — BASIC METABOLIC PANEL
BUN/Creatinine Ratio: 24 (ref 12–28)
BUN: 23 mg/dL (ref 8–27)
CO2: 21 mmol/L (ref 20–29)
Calcium: 10.2 mg/dL (ref 8.7–10.3)
Chloride: 101 mmol/L (ref 96–106)
Creatinine, Ser: 0.95 mg/dL (ref 0.57–1.00)
GFR calc Af Amer: 65 mL/min/{1.73_m2} (ref >59)
GFR, EST NON AFRICAN AMERICAN: 57 mL/min/{1.73_m2} — AB (ref >59)
GLUCOSE: 95 mg/dL (ref 65–99)
POTASSIUM: 5 mmol/L (ref 3.5–5.2)
SODIUM: 141 mmol/L (ref 134–144)

## 2017-03-15 LAB — CBC WITH DIFFERENTIAL/PLATELET
BASOS ABS: 0 10*3/uL (ref 0.0–0.2)
Basos: 0 %
EOS (ABSOLUTE): 0.1 10*3/uL (ref 0.0–0.4)
Eos: 1 %
Hematocrit: 43.3 % (ref 34.0–46.6)
Hemoglobin: 14.7 g/dL (ref 11.1–15.9)
Immature Grans (Abs): 0 10*3/uL (ref 0.0–0.1)
Immature Granulocytes: 0 %
Lymphocytes Absolute: 1.6 10*3/uL (ref 0.7–3.1)
Lymphs: 19 %
MCH: 30.4 pg (ref 26.6–33.0)
MCHC: 33.9 g/dL (ref 31.5–35.7)
MCV: 90 fL (ref 79–97)
MONOS ABS: 0.7 10*3/uL (ref 0.1–0.9)
Monocytes: 8 %
Neutrophils Absolute: 6.1 10*3/uL (ref 1.4–7.0)
Neutrophils: 72 %
Platelets: 285 10*3/uL (ref 150–379)
RBC: 4.83 x10E6/uL (ref 3.77–5.28)
RDW: 14.1 % (ref 12.3–15.4)
WBC: 8.5 10*3/uL (ref 3.4–10.8)

## 2017-03-20 ENCOUNTER — Encounter (HOSPITAL_COMMUNITY): Payer: Self-pay | Admitting: Anesthesiology

## 2017-03-20 ENCOUNTER — Encounter (HOSPITAL_COMMUNITY)
Admission: RE | Disposition: A | Discharge status: Home / Self Care | Payer: Self-pay | Source: Ambulatory Visit | Attending: Cardiovascular Disease

## 2017-03-20 ENCOUNTER — Ambulatory Visit (HOSPITAL_COMMUNITY)
Admission: RE | Admit: 2017-03-20 | Discharge: 2017-03-20 | Disposition: A | Discharge status: Home / Self Care | Payer: PPO | Source: Ambulatory Visit | Attending: Cardiovascular Disease | Admitting: Cardiovascular Disease

## 2017-03-20 DIAGNOSIS — I491 Atrial premature depolarization: Secondary | ICD-10-CM | POA: Insufficient documentation

## 2017-03-20 DIAGNOSIS — Z538 Procedure and treatment not carried out for other reasons: Secondary | ICD-10-CM | POA: Diagnosis not present

## 2017-03-20 SURGERY — CANCELLED PROCEDURE

## 2017-03-20 MED ORDER — SODIUM CHLORIDE 0.9 % IV SOLN: Freq: Once | INTRAVENOUS | Status: DC

## 2017-03-20 NOTE — Anesthesia Preprocedure Evaluation (Deleted)
Anesthesia Evaluation  Patient identified by MRN, date of birth, ID band Patient awake    Reviewed: Allergy & Precautions, NPO status , Patient's Chart, lab work & pertinent test results  History of Anesthesia Complications (+) PONV  Airway Mallampati: II  TM Distance: >3 FB Neck ROM: Full    Dental  (+) Dental Advisory Given   Pulmonary neg pulmonary ROS,    breath sounds clear to auscultation       Cardiovascular hypertension, Pt. on medications and Pt. on home beta blockers + Peripheral Vascular Disease and +CHF  + dysrhythmias  Rhythm:Regular Rate:Normal  11/2016 Echo: Impressions: - Normal LV systolic function; moderate diastolic dysfunction; mild  LVE; moderate LAE; mild TR with moderate pulmonary hypertension.   Neuro/Psych negative neurological ROS     GI/Hepatic Neg liver ROS, GERD  ,  Endo/Other  negative endocrine ROS  Renal/GU negative Renal ROS     Musculoskeletal  (+) Arthritis ,   Abdominal   Peds  Hematology negative hematology ROS (+)   Anesthesia Other Findings   Reproductive/Obstetrics                             Anesthesia Physical Anesthesia Plan  ASA: III  Anesthesia Plan: General   Post-op Pain Management:    Induction: Intravenous  PONV Risk Score and Plan: 4 or greater and Treatment may vary due to age or medical condition and Propofol infusion  Airway Management Planned: Mask  Additional Equipment:   Intra-op Plan:   Post-operative Plan:   Informed Consent: I have reviewed the patients History and Physical, chart, labs and discussed the procedure including the risks, benefits and alternatives for the proposed anesthesia with the patient or authorized representative who has indicated his/her understanding and acceptance.     Plan Discussed with:   Anesthesia Plan Comments:         Anesthesia Quick Evaluation

## 2017-03-20 NOTE — Progress Notes (Signed)
Patient admitted to Endo unit and placed on monitor and appeared to be in NSR. EKG showed NSR and MD confirmed. Patient discharged home with instructions to continue medications and keep appointment in April. Patient and wife instructed to call Dr. Olin Pia office if anything changes before the appointment. Patient's procedure cancelled prior to entering into procedure room

## 2017-04-06 DIAGNOSIS — Z6829 Body mass index (BMI) 29.0-29.9, adult: Secondary | ICD-10-CM | POA: Diagnosis not present

## 2017-04-06 DIAGNOSIS — E785 Hyperlipidemia, unspecified: Secondary | ICD-10-CM | POA: Diagnosis not present

## 2017-04-06 DIAGNOSIS — S76011A Strain of muscle, fascia and tendon of right hip, initial encounter: Secondary | ICD-10-CM | POA: Diagnosis not present

## 2017-05-02 DIAGNOSIS — M1611 Unilateral primary osteoarthritis, right hip: Secondary | ICD-10-CM | POA: Diagnosis not present

## 2017-05-03 DIAGNOSIS — R2689 Other abnormalities of gait and mobility: Secondary | ICD-10-CM | POA: Diagnosis not present

## 2017-05-03 DIAGNOSIS — M6281 Muscle weakness (generalized): Secondary | ICD-10-CM | POA: Diagnosis not present

## 2017-05-03 DIAGNOSIS — M25551 Pain in right hip: Secondary | ICD-10-CM | POA: Diagnosis not present

## 2017-05-03 DIAGNOSIS — M1611 Unilateral primary osteoarthritis, right hip: Secondary | ICD-10-CM | POA: Diagnosis not present

## 2017-05-09 DIAGNOSIS — M1611 Unilateral primary osteoarthritis, right hip: Secondary | ICD-10-CM | POA: Diagnosis not present

## 2017-05-09 DIAGNOSIS — M25551 Pain in right hip: Secondary | ICD-10-CM | POA: Diagnosis not present

## 2017-05-09 DIAGNOSIS — M6281 Muscle weakness (generalized): Secondary | ICD-10-CM | POA: Diagnosis not present

## 2017-05-09 DIAGNOSIS — R2689 Other abnormalities of gait and mobility: Secondary | ICD-10-CM | POA: Diagnosis not present

## 2017-05-09 NOTE — Progress Notes (Signed)
Electrophysiology Office Note Date: 05/18/2017  ID:  Colleen Galloway, DOB [birth year], MRN [id number]  PCP: Colleen Dress, MD Electrophysiologist: Colleen Galloway  CC: AF follow up  Colleen Galloway is a 83 y.o. female seen today for Dr Colleen Galloway.  She presents today for routine electrophysiology followup.  Since last being seen in our clinic, the patient reports doing reasonably well.  She has occasional palpitations but nothing sustained. She continues to live independently.   She denies chest pain, dyspnea, PND, orthopnea, nausea, vomiting, dizziness, syncope, edema, weight gain, or early satiety.  Past Medical History:  Diagnosis Date  . Allergy   . Arthritis   . Breast cancer ([facility]) 09/17/13   Left iNVASIVE DUCTAL,dcis  . Calculus of kidney    kidney stones  . Carotid artery disease ([place])    Followed by Dr. Scot Galloway  . CHF (congestive heart failure) ([place])   . Esophageal reflux   . Insomnia, unspecified   . Lumbosacral spondylosis without myelopathy   . Orthostatic lightheadedness 10/20/2011  . Osteoarthrosis, unspecified whether generalized or localized, unspecified site   . Persistent atrial fibrillation ([place])   . PONV (postoperative nausea and vomiting)    Past Surgical History:  Procedure Laterality Date  . BREAST LUMPECTOMY WITH NEEDLE LOCALIZATION AND AXILLARY SENTINEL LYMPH NODE BX Left 10/18/2013   Procedure: LEFT AXILLARY LYMPHATIC MAPPING; INJECTION OF METHYLENE BLUE INTO LEFT BREAST; LEFT BREAST PARTIAL MASTECTOMY AFTER NEEDLE LOCALIZATION; AXILLARY SENTINEL LYMPH NODE BIOPSY;  Surgeon: Colleen Confer, MD;  Location: [facility];  Service: General;  Laterality: Left;  . DILATION AND CURETTAGE OF UTERUS  1960  . KIDNEY STONE SURGERY    . PORT-A-CATH REMOVAL Right 12/26/2014   Procedure: REMOVAL PORT-A-CATH;  Surgeon: Colleen Confer, MD;  Location: [facility];  Service: General;  Laterality: Right;  . PORTACATH PLACEMENT N/A 10/18/2013   Procedure: INSERTION  PORT-A-CATH/ULTRASOUND GUIDED,RIGHT INTERNAL JUGULAR;  Surgeon: Colleen Confer, MD;  Location: [facility];  Service: General;  Laterality: N/A;  . varicose veins      Current Outpatient Medications  Medication Sig Dispense Refill  . acetaminophen (TYLENOL) 500 MG tablet Take 1,000 mg by mouth every 8 (eight) hours as needed.     Marland Kitchen anastrozole (ARIMIDEX) 1 MG tablet Take 1 mg by mouth daily.    . calcium citrate-vitamin D (CITRACAL+D) 315-200 MG-UNIT per tablet Take 1 tablet by mouth daily.     . chlorthalidone (HYGROTON) 25 MG tablet Take 1 tablet (25 mg total) by mouth daily as needed (for fluid). 30 tablet 0  . Cholecalciferol 5000 units capsule Take 10,000 Units by mouth 2 (two) times daily.    Marland Kitchen doxycycline (ADOXA) 50 MG tablet Take 50 mg by mouth daily as needed (for rosacea).     . fenofibrate 160 MG tablet Take 160 mg by mouth daily after supper.     . meclizine (ANTIVERT) 25 MG tablet Take 1 tablet (25 mg total) by mouth 3 (three) times daily as needed for dizziness. 90 tablet 2  . metoprolol succinate (TOPROL-XL) 25 MG 24 hr tablet Take 1 tablet (25 mg total) by mouth 2 (two) times daily. (Patient taking differently: Take 25 mg by mouth at bedtime. ) 60 tablet 3  . Multiple Vitamin (MULTIVITAMIN) capsule Take 1 capsule by mouth daily.      . Omega-3 Fatty Acids (EQL OMEGA 3 FISH OIL) 1400 MG CAPS Take 2-4 capsules by mouth See admin instructions. 2-4 capsules 1-2 times daily depending on what patient  wants to do    . omeprazole (PRILOSEC) 20 MG capsule Take 20 mg by mouth daily.      Marland Kitchen Propylene Glycol (SYSTANE BALANCE OP) Apply 1 drop to eye 2 (two) times daily.    . rivaroxaban (XARELTO) 20 MG TABS tablet Take 1 tablet (20 mg total) by mouth daily with supper. 30 tablet 0  . Vaginal Lubricant (REPLENS) GEL Place 1 Applicatorful vaginally daily as needed (for dryness). Reported on 07/29/2015    . vitamin B-12 (CYANOCOBALAMIN) 1000 MCG tablet Take 1,000 mcg by mouth daily.     No current  facility-administered medications for this visit.     Allergies:   Flecainide; Nebivolol; Diltiazem hcl; Lopressor [metoprolol tartrate]; and Sodium pantothenate   Social History: Social History   Socioeconomic History  . Marital status: Widowed    Spouse name: Not on file  . Number of children: Not on file  . Years of education: Not on file  . Highest education level: Not on file  Occupational History  . Not on file  Social Needs  . Financial resource strain: Not on file  . Food insecurity:    Worry: Not on file    Inability: Not on file  . Transportation needs:    Medical: Not on file    Non-medical: Not on file  Tobacco Use  . Smoking status: Never Smoker  . Smokeless tobacco: Never Used  Substance and Sexual Activity  . Alcohol use: No  . Drug use: No  . Sexual activity: Not on file  Lifestyle  . Physical activity:    Days per week: Not on file    Minutes per session: Not on file  . Stress: Not on file  Relationships  . Social connections:    Talks on phone: Not on file    Gets together: Not on file    Attends religious service: Not on file    Active member of club or organization: Not on file    Attends meetings of clubs or organizations: Not on file    Relationship status: Not on file  . Intimate partner violence:    Fear of current or ex partner: Not on file    Emotionally abused: Not on file    Physically abused: Not on file    Forced sexual activity: Not on file  Other Topics Concern  . Not on file  Social History Narrative   Non smoker/ no tobacco use   Current work/retired   Martial status/widowed   Non drinker/ no alcohol use   Seat belt use/ always wears seatbelt   Caffeine use   Guns in the home          Family History: Family History  Problem Relation Age of Onset  . Heart disease Mother   . Asthma Mother   . Hypertension Mother   . Deep vein thrombosis Mother   . Other Mother        varicose veins  . Lung cancer Mother 70        non-smoker  . Heart disease Father   . Hypertension Father   . Stroke Father   . Heart disease Sister   . Hypertension Sister   . Hyperlipidemia Sister   . Rectal cancer Sister 12  . Heart disease Brother   . Asthma Brother   . Hypertension Brother   . Deep vein thrombosis Brother   . Diabetes Brother   . Colon cancer Other 35  maternal cousin's son; NOS  . Colon cancer Maternal Grandfather 24  . Liver disease Brother 53       Cirrhosis due to infection from gallbladder  . Melanoma Other     Review of Systems: All other systems reviewed and are otherwise negative except as noted above.   Physical Exam: VS:  BP 132/80   Pulse 80   Ht 5\' 3"  (1.6 m)   Wt 172 lb (78 kg)   SpO2 100%   BMI 30.47 kg/m  , BMI Body mass index is 30.47 kg/m. Wt Readings from Last 3 Encounters:  05/17/17 172 lb (78 kg)  03/14/17 172 lb (78 kg)  12/09/16 176 lb (79.8 kg)    GEN- The patient is elderly appearing, alert and oriented x 3 today.   HEENT: normocephalic, atraumatic; sclera clear, conjunctiva pink; hearing intact; oropharynx clear; neck supple  Lungs- Clear to ausculation bilaterally, normal work of breathing.  No wheezes, rales, rhonchi Heart- Regular rate and rhythm  GI- soft, non-tender, non-distended, bowel sounds present  Extremities- no clubbing, cyanosis, or edema  MS- no significant deformity or atrophy Skin- warm and dry, no rash or lesion  Psych- euthymic mood, full affect Neuro- strength and sensation are intact   Recent Labs: 03/14/2017: BUN 23; Creatinine, Ser 0.95; Hemoglobin 14.7; Platelets 285; Potassium 5.0; Sodium 141    Other studies Reviewed: Additional studies/ records that were reviewed today include: Dr Olin Pia office notes  Assessment and Plan: 1.  Persistent atrial fibrillation Burden low by symptoms She is taking extra Toprol as needed  Continue Xarelto for CHADS2VASC of 4  2.  HTN Stable No change required today   Current medicines are  reviewed at length with the patient today.   The patient does not have concerns regarding her medicines.  The following changes were made today:  none  Labs/ tests ordered today include:  No orders of the defined types were placed in this encounter.    Disposition:   Follow up with Dr Colleen Galloway in 6 months    Signed, Chanetta Marshall, NP 05/18/2017 8:30 AM   [address] [place] [address] [phone] (office) [phone] (fax)

## 2017-05-10 DIAGNOSIS — Z6820 Body mass index (BMI) 20.0-20.9, adult: Secondary | ICD-10-CM | POA: Diagnosis not present

## 2017-05-10 DIAGNOSIS — M1611 Unilateral primary osteoarthritis, right hip: Secondary | ICD-10-CM | POA: Diagnosis not present

## 2017-05-10 DIAGNOSIS — M25551 Pain in right hip: Secondary | ICD-10-CM | POA: Diagnosis not present

## 2017-05-10 DIAGNOSIS — L259 Unspecified contact dermatitis, unspecified cause: Secondary | ICD-10-CM | POA: Diagnosis not present

## 2017-05-15 DIAGNOSIS — R2689 Other abnormalities of gait and mobility: Secondary | ICD-10-CM | POA: Diagnosis not present

## 2017-05-15 DIAGNOSIS — M25551 Pain in right hip: Secondary | ICD-10-CM | POA: Diagnosis not present

## 2017-05-15 DIAGNOSIS — M6281 Muscle weakness (generalized): Secondary | ICD-10-CM | POA: Diagnosis not present

## 2017-05-15 DIAGNOSIS — M1611 Unilateral primary osteoarthritis, right hip: Secondary | ICD-10-CM | POA: Diagnosis not present

## 2017-05-17 ENCOUNTER — Encounter: Payer: Self-pay | Admitting: Nurse Practitioner

## 2017-05-17 ENCOUNTER — Ambulatory Visit: Payer: PPO | Admitting: Nurse Practitioner

## 2017-05-17 VITALS — BP 132/80 | HR 80 | Ht 63.0 in | Wt 172.0 lb

## 2017-05-17 DIAGNOSIS — I10 Essential (primary) hypertension: Secondary | ICD-10-CM

## 2017-05-17 DIAGNOSIS — I481 Persistent atrial fibrillation: Secondary | ICD-10-CM | POA: Diagnosis not present

## 2017-05-17 DIAGNOSIS — I4819 Other persistent atrial fibrillation: Secondary | ICD-10-CM

## 2017-05-17 NOTE — Patient Instructions (Addendum)
Medication Instructions:   Your physician recommends that you continue on your current medications as directed. Please refer to the Current Medication list given to you today.   If you need a refill on your cardiac medications before your next appointment, please call your pharmacy.  Labwork:  NONE ORDERED  TODAY    Testing/Procedures: NONE ORDERED  TODAY    Follow-Up:  Your physician wants you to follow-up in:  IN  6  MONTHS WITH DR KLEIN  You will receive a reminder letter in the mail two months in advance. If you don't receive a letter, please call our office to schedule the follow-up appointment.      Any Other Special Instructions Will Be Listed Below (If Applicable).                                                                                                                                                   

## 2017-05-24 DIAGNOSIS — M6281 Muscle weakness (generalized): Secondary | ICD-10-CM | POA: Diagnosis not present

## 2017-05-24 DIAGNOSIS — R2689 Other abnormalities of gait and mobility: Secondary | ICD-10-CM | POA: Diagnosis not present

## 2017-05-24 DIAGNOSIS — M25551 Pain in right hip: Secondary | ICD-10-CM | POA: Diagnosis not present

## 2017-05-24 DIAGNOSIS — M1611 Unilateral primary osteoarthritis, right hip: Secondary | ICD-10-CM | POA: Diagnosis not present

## 2017-05-29 DIAGNOSIS — M25551 Pain in right hip: Secondary | ICD-10-CM | POA: Diagnosis not present

## 2017-05-29 DIAGNOSIS — M1611 Unilateral primary osteoarthritis, right hip: Secondary | ICD-10-CM | POA: Diagnosis not present

## 2017-05-29 DIAGNOSIS — R2689 Other abnormalities of gait and mobility: Secondary | ICD-10-CM | POA: Diagnosis not present

## 2017-05-29 DIAGNOSIS — M6281 Muscle weakness (generalized): Secondary | ICD-10-CM | POA: Diagnosis not present

## 2017-05-31 DIAGNOSIS — M25551 Pain in right hip: Secondary | ICD-10-CM | POA: Diagnosis not present

## 2017-05-31 DIAGNOSIS — M6281 Muscle weakness (generalized): Secondary | ICD-10-CM | POA: Diagnosis not present

## 2017-05-31 DIAGNOSIS — R2689 Other abnormalities of gait and mobility: Secondary | ICD-10-CM | POA: Diagnosis not present

## 2017-05-31 DIAGNOSIS — M1611 Unilateral primary osteoarthritis, right hip: Secondary | ICD-10-CM | POA: Diagnosis not present

## 2017-06-05 DIAGNOSIS — M25551 Pain in right hip: Secondary | ICD-10-CM | POA: Diagnosis not present

## 2017-06-05 DIAGNOSIS — M6281 Muscle weakness (generalized): Secondary | ICD-10-CM | POA: Diagnosis not present

## 2017-06-05 DIAGNOSIS — M1611 Unilateral primary osteoarthritis, right hip: Secondary | ICD-10-CM | POA: Diagnosis not present

## 2017-06-05 DIAGNOSIS — R2689 Other abnormalities of gait and mobility: Secondary | ICD-10-CM | POA: Diagnosis not present

## 2017-06-13 DIAGNOSIS — M25551 Pain in right hip: Secondary | ICD-10-CM | POA: Diagnosis not present

## 2017-06-21 DIAGNOSIS — M5126 Other intervertebral disc displacement, lumbar region: Secondary | ICD-10-CM | POA: Diagnosis not present

## 2017-06-21 DIAGNOSIS — M48061 Spinal stenosis, lumbar region without neurogenic claudication: Secondary | ICD-10-CM | POA: Diagnosis not present

## 2017-06-21 DIAGNOSIS — M47816 Spondylosis without myelopathy or radiculopathy, lumbar region: Secondary | ICD-10-CM | POA: Diagnosis not present

## 2017-06-27 DIAGNOSIS — M47816 Spondylosis without myelopathy or radiculopathy, lumbar region: Secondary | ICD-10-CM | POA: Diagnosis not present

## 2017-07-17 DIAGNOSIS — M47817 Spondylosis without myelopathy or radiculopathy, lumbosacral region: Secondary | ICD-10-CM | POA: Diagnosis not present

## 2017-07-17 DIAGNOSIS — M47816 Spondylosis without myelopathy or radiculopathy, lumbar region: Secondary | ICD-10-CM | POA: Diagnosis not present

## 2017-08-01 DIAGNOSIS — M47816 Spondylosis without myelopathy or radiculopathy, lumbar region: Secondary | ICD-10-CM | POA: Diagnosis not present

## 2017-08-02 DIAGNOSIS — I480 Paroxysmal atrial fibrillation: Secondary | ICD-10-CM | POA: Diagnosis not present

## 2017-08-02 DIAGNOSIS — Z139 Encounter for screening, unspecified: Secondary | ICD-10-CM | POA: Diagnosis not present

## 2017-08-02 DIAGNOSIS — E785 Hyperlipidemia, unspecified: Secondary | ICD-10-CM | POA: Diagnosis not present

## 2017-08-02 DIAGNOSIS — M810 Age-related osteoporosis without current pathological fracture: Secondary | ICD-10-CM | POA: Diagnosis not present

## 2017-08-02 DIAGNOSIS — Z1339 Encounter for screening examination for other mental health and behavioral disorders: Secondary | ICD-10-CM | POA: Diagnosis not present

## 2017-08-02 DIAGNOSIS — E119 Type 2 diabetes mellitus without complications: Secondary | ICD-10-CM | POA: Diagnosis not present

## 2017-08-02 DIAGNOSIS — Z79899 Other long term (current) drug therapy: Secondary | ICD-10-CM | POA: Diagnosis not present

## 2017-08-02 DIAGNOSIS — I872 Venous insufficiency (chronic) (peripheral): Secondary | ICD-10-CM | POA: Diagnosis not present

## 2017-08-02 DIAGNOSIS — Z6829 Body mass index (BMI) 29.0-29.9, adult: Secondary | ICD-10-CM | POA: Diagnosis not present

## 2017-09-06 ENCOUNTER — Ambulatory Visit: Payer: PPO | Admitting: Vascular Surgery

## 2017-09-06 ENCOUNTER — Encounter (HOSPITAL_COMMUNITY): Payer: PPO

## 2017-09-13 ENCOUNTER — Encounter: Payer: Self-pay | Admitting: Vascular Surgery

## 2017-09-13 ENCOUNTER — Ambulatory Visit (HOSPITAL_COMMUNITY)
Admission: RE | Admit: 2017-09-13 | Discharge: 2017-09-13 | Disposition: A | Discharge status: Home / Self Care | Payer: PPO | Source: Ambulatory Visit | Attending: Vascular Surgery | Admitting: Vascular Surgery

## 2017-09-13 ENCOUNTER — Other Ambulatory Visit: Payer: Self-pay

## 2017-09-13 ENCOUNTER — Ambulatory Visit (INDEPENDENT_AMBULATORY_CARE_PROVIDER_SITE_OTHER): Payer: PPO | Admitting: Vascular Surgery

## 2017-09-13 ENCOUNTER — Encounter (HOSPITAL_COMMUNITY): Payer: PPO

## 2017-09-13 VITALS — BP 143/89 | HR 89 | Temp 98.5°F | Resp 16 | Ht 63.0 in | Wt 175.0 lb

## 2017-09-13 DIAGNOSIS — I6521 Occlusion and stenosis of right carotid artery: Secondary | ICD-10-CM

## 2017-09-13 DIAGNOSIS — I6523 Occlusion and stenosis of bilateral carotid arteries: Secondary | ICD-10-CM | POA: Insufficient documentation

## 2017-09-13 NOTE — Progress Notes (Signed)
Patient name: Colleen Galloway MRN: [id number] DOB: 1934/03/04 Sex: female  REASON FOR VISIT:   87 to 59% right carotid stenosis.  HPI:   Colleen Galloway is a pleasant 83 y.o. female been following with a moderate right carotid stenosis.  I last saw her a year ago.  She comes in for a one-year follow-up visit.  Since I saw her last she denies any history of stroke, TIAs, expressive or receptive aphasia, or amaurosis fugax.  Her only complaint has been some back pain and she is being worked up for lumbar disc disease.  She denies any history of claudication or rest pain.  She has had some swelling and varicose veins in both legs.  Since I saw her last she also tells me that she has been diagnosed with pulmonary hypertension.  Past Medical History:  Diagnosis Date  . Allergy   . Arthritis   . Breast cancer ([place]) 09/17/13   Left iNVASIVE DUCTAL,dcis  . Calculus of kidney    kidney stones  . Carotid artery disease ([place])    Followed by Dr. Scot Dock  . CHF (congestive heart failure) ([place])   . Esophageal reflux   . Insomnia, unspecified   . Lumbosacral spondylosis without myelopathy   . Orthostatic lightheadedness 10/20/2011  . Osteoarthrosis, unspecified whether generalized or localized, unspecified site   . Persistent atrial fibrillation ([place])   . PONV (postoperative nausea and vomiting)     Family History  Problem Relation Age of Onset  . Heart disease Mother   . Asthma Mother   . Hypertension Mother   . Deep vein thrombosis Mother   . Other Mother        varicose veins  . Lung cancer Mother 2       non-smoker  . Heart disease Father   . Hypertension Father   . Stroke Father   . Heart disease Sister   . Hypertension Sister   . Hyperlipidemia Sister   . Rectal cancer Sister 32  . Heart disease Brother   . Asthma Brother   . Hypertension Brother   . Deep vein thrombosis Brother   . Diabetes Brother   . Colon cancer Other 70       maternal cousin's son; NOS  . Colon  cancer Maternal Grandfather 58  . Liver disease Brother 29       Cirrhosis due to infection from gallbladder  . Melanoma Other     SOCIAL HISTORY: Social History   Tobacco Use  . Smoking status: Never Smoker  . Smokeless tobacco: Never Used  Substance Use Topics  . Alcohol use: No    Allergies  Allergen Reactions  . Flecainide Shortness Of Breath, Swelling and Other (See Comments)    Sob, visual problems, swelling ankles  . Nebivolol Shortness Of Breath and Swelling    Sob, visual problems, swelling in ankles  . Diltiazem Hcl Swelling    Pt states it causes her ankle swelling   . Lopressor [Metoprolol Tartrate]     Severe dizziness. Irregular heartbeat   . Sodium Pantothenate Nausea And Vomiting    Current Outpatient Medications  Medication Sig Dispense Refill  . anastrozole (ARIMIDEX) 1 MG tablet Take 1 mg by mouth daily.    . fenofibrate 160 MG tablet Take 160 mg by mouth daily after supper.     . metoprolol succinate (TOPROL-XL) 25 MG 24 hr tablet Take 1 tablet (25 mg total) by mouth 2 (two) times daily. (Patient taking differently: Take  25 mg by mouth at bedtime. ) 60 tablet 3  . omeprazole (PRILOSEC) 20 MG capsule Take 20 mg by mouth daily.      Marland Kitchen Propylene Glycol (SYSTANE BALANCE OP) Apply 1 drop to eye 2 (two) times daily.    . rivaroxaban (XARELTO) 20 MG TABS tablet Take 1 tablet (20 mg total) by mouth daily with supper. 30 tablet 0  . Vaginal Lubricant (REPLENS) GEL Place 1 Applicatorful vaginally daily as needed (for dryness). Reported on 07/29/2015    . acetaminophen (TYLENOL) 500 MG tablet Take 1,000 mg by mouth every 8 (eight) hours as needed.     . calcium citrate-vitamin D (CITRACAL+D) 315-200 MG-UNIT per tablet Take 1 tablet by mouth daily.     . chlorthalidone (HYGROTON) 25 MG tablet Take 1 tablet (25 mg total) by mouth daily as needed (for fluid). (Patient not taking: Reported on 09/13/2017) 30 tablet 0  . Cholecalciferol 5000 units capsule Take 10,000 Units  by mouth 2 (two) times daily.    . meclizine (ANTIVERT) 25 MG tablet Take 1 tablet (25 mg total) by mouth 3 (three) times daily as needed for dizziness. (Patient not taking: Reported on 09/13/2017) 90 tablet 2  . Multiple Vitamin (MULTIVITAMIN) capsule Take 1 capsule by mouth daily.      . Omega-3 Fatty Acids (EQL OMEGA 3 FISH OIL) 1400 MG CAPS Take 2-4 capsules by mouth See admin instructions. 2-4 capsules 1-2 times daily depending on what patient wants to do    . vitamin B-12 (CYANOCOBALAMIN) 1000 MCG tablet Take 1,000 mcg by mouth daily.     No current facility-administered medications for this visit.     REVIEW OF SYSTEMS:  [X]  denotes positive finding, [ ]  denotes negative finding Cardiac  Comments:  Chest pain or chest pressure:    Shortness of breath upon exertion: x   Short of breath when lying flat:    Irregular heart rhythm: x       Vascular    Pain in calf, thigh, or hip brought on by ambulation:    Pain in feet at night that wakes you up from your sleep:     Blood clot in your veins:    Leg swelling:  x       Pulmonary    Oxygen at home:    Productive cough:     Wheezing:  x       Neurologic    Sudden weakness in arms or legs:     Sudden numbness in arms or legs:     Sudden onset of difficulty speaking or slurred speech:    Temporary loss of vision in one eye:     Problems with dizziness:  x       Gastrointestinal    Blood in stool:     Vomited blood:         Genitourinary    Burning when urinating:     Blood in urine:        Psychiatric    Major depression:         Hematologic    Bleeding problems:    Problems with blood clotting too easily:        Skin    Rashes or ulcers:        Constitutional    Fever or chills:     PHYSICAL EXAM:   Vitals:   09/13/17 1303  BP: (!) 143/89  Pulse: 89  Resp: 16  Temp: 98.5 F (36.9 C)  TempSrc: Oral  SpO2: 98%  Weight: 175 lb (79.4 kg)  Height: 5\' 3"  (1.6 m)    GENERAL: The patient is a  well-nourished female, in no acute distress. The vital signs are documented above. CARDIAC: There is a regular rate and rhythm.  VASCULAR: I do not detect carotid bruits. She has dorsalis pedis pulses bilaterally. She has spider veins bilaterally and some mild hyperpigmentation bilaterally.  She has mild bilateral lower extremity swelling. PULMONARY: There is good air exchange bilaterally without wheezing or rales. ABDOMEN: Soft and non-tender with normal pitched bowel sounds.  MUSCULOSKELETAL: There are no major deformities or cyanosis. NEUROLOGIC: No focal weakness or paresthesias are detected. SKIN: There are no ulcers or rashes noted. PSYCHIATRIC: The patient has a normal affect.  DATA:    CAROTID DUPLEX: I have independently interpreted her carotid duplex scan today.  On the right side she has a 40 to 59% carotid stenosis.  This is stable over the last year.  On the left side she has a less than 39% stenosis.  Both vertebral arteries are patent with antegrade flow.  MEDICAL ISSUES:   MODERATE RIGHT CAROTID STENOSIS: This patient has a moderate 40 to 59% right carotid stenosis which is asymptomatic.  She understands we would not consider right carotid endarterectomy unless the stenosis progressed to greater than 80% or she develop right hemispheric symptoms.  I recommended a follow-up duplex scan in 1 year which I have ordered.  She is not on aspirin as she is on Xarelto.  CHRONIC VENOUS INSUFFICIENCY: Patient does have evidence of CEAP clinical class for a venous disease on exam.  We have discussed the importance of intermittent leg elevation the proper positioning for this.  She already has compression stockings.  I have encouraged her to avoid prolonged sitting and standing and to exercise as much as possible.  Also instructed her to consider water aerobics which is also very helpful for patients with venous disease.  Deitra Mayo [facility] [phone]

## 2017-10-13 ENCOUNTER — Encounter: Payer: Self-pay | Admitting: Internal Medicine

## 2017-10-31 ENCOUNTER — Encounter: Payer: Self-pay | Admitting: Internal Medicine

## 2017-10-31 ENCOUNTER — Ambulatory Visit: Payer: PPO | Admitting: Internal Medicine

## 2017-10-31 ENCOUNTER — Encounter (INDEPENDENT_AMBULATORY_CARE_PROVIDER_SITE_OTHER): Payer: Self-pay

## 2017-10-31 VITALS — BP 120/82 | HR 113 | Ht 63.0 in | Wt 179.6 lb

## 2017-10-31 DIAGNOSIS — I481 Persistent atrial fibrillation: Secondary | ICD-10-CM

## 2017-10-31 DIAGNOSIS — I4819 Other persistent atrial fibrillation: Secondary | ICD-10-CM

## 2017-10-31 DIAGNOSIS — R42 Dizziness and giddiness: Secondary | ICD-10-CM | POA: Diagnosis not present

## 2017-10-31 MED ORDER — FUROSEMIDE 20 MG PO TABS: 20.0000 mg | ORAL_TABLET | Freq: Every day | ORAL | 3 refills | Status: DC

## 2017-10-31 NOTE — Progress Notes (Signed)
Patient Care Team: Nicoletta Dress, MD as PCP - General (Internal Medicine) Deboraha Sprang, MD as Attending Physician (Cardiology)   HPI  Colleen Galloway is a 83 y.o. female Seen in followup for atrial fibrillation for which she is anticoagulated. She had problems with edema on calcium channel blockers and has been managed with beta blockers.  She was diagnosed with breast cancer and underwent partial mastectomy, chemotherapy and radiation therapy. She has now completed treatment.   Her atrial fibrillation has been more problematic of late. She is having no bleeding issues on her Xarelto.  Echocardiogram 3/16 was normal there was minimal left atrial enlargement and minimal  pulmonary artery pressure elevation--40   Date Cr Hgb  6/18 0.76 13.5            Echocardiogram was obtained as noted below she is concerned about pulmonary hypertension.  Reviewing echoes back to 2013 she has had PA pressures 30-40 throughout.  She has had mild dyspnea.  She has had some nocturnal dyspnea as well. Some peripheral edema.   DATE TEST    3/16 Echo    EF 55-60 % PA sys 38  10/18 Echo    EF 55 %  PA Sys 50          Past Medical History:  Diagnosis Date  . Allergy   . Arthritis   . Breast cancer ([place]) 09/17/13   Left iNVASIVE DUCTAL,dcis  . Calculus of kidney    kidney stones  . Carotid artery disease ([place])    Followed by Dr. Scot Dock  . CHF (congestive heart failure) ([place])   . Esophageal reflux   . Insomnia, unspecified   . Lumbosacral spondylosis without myelopathy   . Orthostatic lightheadedness 10/20/2011  . Osteoarthrosis, unspecified whether generalized or localized, unspecified site   . Persistent atrial fibrillation ([place])   . PONV (postoperative nausea and vomiting)     Past Surgical History:  Procedure Laterality Date  . BREAST LUMPECTOMY WITH NEEDLE LOCALIZATION AND AXILLARY SENTINEL LYMPH NODE BX Left 10/18/2013   Procedure: LEFT AXILLARY LYMPHATIC MAPPING;  INJECTION OF METHYLENE BLUE INTO LEFT BREAST; LEFT BREAST PARTIAL MASTECTOMY AFTER NEEDLE LOCALIZATION; AXILLARY SENTINEL LYMPH NODE BIOPSY;  Surgeon: Jackolyn Confer, MD;  Location: [facility];  Service: General;  Laterality: Left;  . DILATION AND CURETTAGE OF UTERUS  1960  . KIDNEY STONE SURGERY    . PORT-A-CATH REMOVAL Right 12/26/2014   Procedure: REMOVAL PORT-A-CATH;  Surgeon: Jackolyn Confer, MD;  Location: [place];  Service: General;  Laterality: Right;  . PORTACATH PLACEMENT N/A 10/18/2013   Procedure: INSERTION PORT-A-CATH/ULTRASOUND GUIDED,RIGHT INTERNAL JUGULAR;  Surgeon: Jackolyn Confer, MD;  Location: [facility];  Service: General;  Laterality: N/A;  . varicose veins      Current Outpatient Medications  Medication Sig Dispense Refill  . acetaminophen (TYLENOL) 500 MG tablet Take 1,000 mg by mouth every 8 (eight) hours as needed.     Marland Kitchen anastrozole (ARIMIDEX) 1 MG tablet Take 1 mg by mouth daily.    . calcium citrate-vitamin D (CITRACAL+D) 315-200 MG-UNIT per tablet Take 1 tablet by mouth daily.     Marland Kitchen doxycycline (VIBRAMYCIN) 50 MG capsule Take 50 mg by mouth daily as needed.    . fenofibrate 160 MG tablet Take 160 mg by mouth daily after supper.     . meclizine (ANTIVERT) 25 MG tablet Take 1 tablet (25 mg total) by mouth 3 (three) times daily as needed for dizziness. 90 tablet  2  . metoprolol succinate (TOPROL-XL) 25 MG 24 hr tablet Take 50 mg by mouth daily.    . Multiple Vitamin (MULTIVITAMIN) capsule Take 1 capsule by mouth daily.      . Omega-3 Fatty Acids (EQL OMEGA 3 FISH OIL) 1400 MG CAPS Take 2-4 capsules by mouth See admin instructions. 2-4 capsules 1-2 times daily depending on what patient wants to do    . omeprazole (PRILOSEC) 20 MG capsule Take 20 mg by mouth daily.      Marland Kitchen Propylene Glycol (SYSTANE BALANCE OP) Apply 1 drop to eye 2 (two) times daily.    . rivaroxaban (XARELTO) 20 MG TABS tablet Take 1 tablet (20 mg total) by mouth daily with supper. 30 tablet 0    . Vaginal Lubricant (REPLENS) GEL Place 1 Applicatorful vaginally daily as needed (for dryness). Reported on 07/29/2015    . vitamin B-12 (CYANOCOBALAMIN) 1000 MCG tablet Take 1,000 mcg by mouth daily.    . furosemide (LASIX) 20 MG tablet Take 1 tablet (20 mg total) by mouth daily. 90 tablet 3   No current facility-administered medications for this visit.     Allergies  Allergen Reactions  . Flecainide Shortness Of Breath, Swelling and Other (See Comments)    Sob, visual problems, swelling ankles  . Nebivolol Shortness Of Breath and Swelling    Sob, visual problems, swelling in ankles  . Diltiazem Hcl Swelling    Pt states it causes her ankle swelling   . Lopressor [Metoprolol Tartrate]     Severe dizziness. Irregular heartbeat   . Sodium Pantothenate Nausea And Vomiting    Review of Systems negative except from HPI and PMH  Physical Exam BP 120/82   Pulse (!) 113   Ht 5\' 3"  (1.6 m)   Wt 179 lb 9.6 oz (81.5 kg)   SpO2 97%   BMI 31.81 kg/m  Well developed and nourished in no acute distress HENT normal Neck supple with JVP-7t Clear Regular rate and rhythm, no murmurs or gallops Abd-soft with active BS No Clubbing cyanosis  Tr edema Skin-warm and dry A & Oriented  Grossly normal sensory and motor function   ECG atrial fibrillation with a rate of 113  Assessment and  Plan  Atrial fibrillation--flutter-persistent rapid ventricular response  Hypertension  HFpEF acute chronic    Breast Cancer-therapy     Pulmonary hypertension   She is modestly volume overloaded.  We will stop her chlorthalidone and put her on furosemide to try to augment diuresis.  She will let us know.  In addition, we need to slow down her ventricular rate.  We have increased her metoprolol from 50 mg once --50 mg twice daily  We have reviewed the importance of salt and water restriction  We spent more than 50% of our >25 min visit in face to face counseling regarding the above   .

## 2017-10-31 NOTE — Patient Instructions (Addendum)
Medication Instructions:  Your physician has recommended you make the following change in your medication:   1. Stop Chlorthalidone 2. Begin Furosemide, 20mg  tablet, once per day as needed for swelling or weight gain greater than 3lbs in a day or 5lbs in a week. 3. Increase your Metoprolol to 50mg , 2 tablets, once per day  Labwork: You will have labs drawn today: BMP   Testing/Procedures: None ordered.  Follow-Up: Your physician wants you to follow-up in: 6 months with Dr Caryl Comes. You will receive a reminder letter in the mail two months in advance. If you don't receive a letter, please call our office to schedule the follow-up appointment.   Any Other Special Instructions Will Be Listed Below (If Applicable).   Please purchase a pulse oximeter at any drug store to keep a record of your heart rate. Check your heart rate a few times per day. If you notice it Greater than 95bmp on a regular bases, please call the office to arrange a visit with our Afib clinic.  If you need a refill on your cardiac medications before your next appointment, please call your pharmacy.

## 2017-11-01 LAB — BASIC METABOLIC PANEL
BUN/Creatinine Ratio: 14 (ref 12–28)
BUN: 14 mg/dL (ref 8–27)
CALCIUM: 9.8 mg/dL (ref 8.7–10.3)
CO2: 22 mmol/L (ref 20–29)
CREATININE: 0.99 mg/dL (ref 0.57–1.00)
Chloride: 104 mmol/L (ref 96–106)
GFR calc Af Amer: 62 mL/min/{1.73_m2} (ref >59)
GFR calc non Af Amer: 54 mL/min/{1.73_m2} — ABNORMAL LOW (ref >59)
GLUCOSE: 125 mg/dL — AB (ref 65–99)
Potassium: 3.7 mmol/L (ref 3.5–5.2)
SODIUM: 142 mmol/L (ref 134–144)

## 2017-11-02 DIAGNOSIS — Z853 Personal history of malignant neoplasm of breast: Secondary | ICD-10-CM | POA: Diagnosis not present

## 2017-11-02 DIAGNOSIS — R922 Inconclusive mammogram: Secondary | ICD-10-CM | POA: Diagnosis not present

## 2017-11-06 DIAGNOSIS — M810 Age-related osteoporosis without current pathological fracture: Secondary | ICD-10-CM | POA: Insufficient documentation

## 2017-11-06 HISTORY — DX: Age-related osteoporosis without current pathological fracture: M81.0

## 2017-11-08 DIAGNOSIS — E119 Type 2 diabetes mellitus without complications: Secondary | ICD-10-CM | POA: Diagnosis not present

## 2017-11-09 ENCOUNTER — Telehealth: Payer: Self-pay | Admitting: Internal Medicine

## 2017-11-09 NOTE — Telephone Encounter (Signed)
Walk In pt form-Sealed enveloped dropped off. Placed in [place] doc box.

## 2017-11-10 ENCOUNTER — Ambulatory Visit: Payer: PPO | Admitting: Internal Medicine

## 2017-11-15 NOTE — Telephone Encounter (Signed)
Spoke with pt regarding her BP and HR readings that she dropped off last week. All her BP's and HR look within normal range. I advised pt to begin taking her BP's and HR once or twice a week around the same time of the day unless she is feeling poor. Pt is pleased with her increase in metoprolol and feels the medication is working well for her. Pt had no additional questions and will call with any additional concerns.

## 2017-11-17 DIAGNOSIS — Z853 Personal history of malignant neoplasm of breast: Secondary | ICD-10-CM | POA: Diagnosis not present

## 2017-11-17 DIAGNOSIS — Z9221 Personal history of antineoplastic chemotherapy: Secondary | ICD-10-CM

## 2017-11-17 DIAGNOSIS — Z79899 Other long term (current) drug therapy: Secondary | ICD-10-CM

## 2017-11-17 DIAGNOSIS — N951 Menopausal and female climacteric states: Secondary | ICD-10-CM | POA: Diagnosis not present

## 2017-11-17 DIAGNOSIS — C50912 Malignant neoplasm of unspecified site of left female breast: Secondary | ICD-10-CM | POA: Diagnosis not present

## 2017-11-17 DIAGNOSIS — M810 Age-related osteoporosis without current pathological fracture: Secondary | ICD-10-CM | POA: Diagnosis not present

## 2017-11-17 DIAGNOSIS — Z79811 Long term (current) use of aromatase inhibitors: Secondary | ICD-10-CM | POA: Diagnosis not present

## 2017-11-20 ENCOUNTER — Encounter: Payer: Self-pay | Admitting: Internal Medicine

## 2017-11-22 ENCOUNTER — Telehealth: Payer: Self-pay

## 2017-11-22 NOTE — Telephone Encounter (Signed)
Left the pt a message to call the office back to request to speak with a triage nurse, to confirm dosage of metoprolol taking.

## 2017-11-22 NOTE — Telephone Encounter (Signed)
Pharmacy faxed over request for pt Metoprolol 50mg  after Metoprolol 25mg  was d/c. The new order was never entered for the higher dosage of Metoprolol.  The pt said that she doesn't have Metoprolol er 50mg  and it was never sent to the pharmacy. She said she really needs it and she ran out of the 25mg  and needs a new prescription.   Please address

## 2017-11-23 MED ORDER — METOPROLOL SUCCINATE ER 50 MG PO TB24: 50.0000 mg | ORAL_TABLET | Freq: Every day | ORAL | 3 refills | Status: DC

## 2017-11-23 NOTE — Telephone Encounter (Signed)
Script for 50mg  toprol, po, qd.. Per pt's medication changes during her last office visit.

## 2017-12-16 DIAGNOSIS — D2272 Melanocytic nevi of left lower limb, including hip: Secondary | ICD-10-CM | POA: Diagnosis not present

## 2017-12-16 DIAGNOSIS — L720 Epidermal cyst: Secondary | ICD-10-CM | POA: Diagnosis not present

## 2018-01-17 ENCOUNTER — Other Ambulatory Visit: Payer: Self-pay | Admitting: *Deleted

## 2018-01-17 MED ORDER — RIVAROXABAN 20 MG PO TABS: 20.0000 mg | ORAL_TABLET | Freq: Every day | ORAL | 0 refills | Status: DC

## 2018-01-17 NOTE — Telephone Encounter (Signed)
Pt called and stated that she needs a refill sent to [facility] only for 30 days. Pt is 83 yrs old, wt-81.5kg, Crea-0.99 on 10/31/17, last seen by Dr. Caryl Comes on 10/31/17, CrCl-57.53ml/min; pt request only 30 day supply and she states she will get a 90 day supply in January after she gets out the donut hole. She will call back or have pharmacy to request 90 days.

## 2018-01-18 MED ORDER — RIVAROXABAN 20 MG PO TABS: 20.0000 mg | ORAL_TABLET | Freq: Every day | ORAL | 0 refills | Status: DC

## 2018-01-18 NOTE — Telephone Encounter (Signed)
Received send refill request, refill sent in yesterday 01/17/18, but states sending never received by pharmacy.  Resent rx refill.

## 2018-01-18 NOTE — Addendum Note (Signed)
Addended by: Brynda Peon on: 01/18/2018 09:54 AM   Modules accepted: Orders

## 2018-02-01 DIAGNOSIS — I480 Paroxysmal atrial fibrillation: Secondary | ICD-10-CM | POA: Diagnosis not present

## 2018-02-01 DIAGNOSIS — Z1231 Encounter for screening mammogram for malignant neoplasm of breast: Secondary | ICD-10-CM | POA: Diagnosis not present

## 2018-02-01 DIAGNOSIS — Z79899 Other long term (current) drug therapy: Secondary | ICD-10-CM | POA: Diagnosis not present

## 2018-02-01 DIAGNOSIS — I872 Venous insufficiency (chronic) (peripheral): Secondary | ICD-10-CM | POA: Diagnosis not present

## 2018-02-01 DIAGNOSIS — Z9181 History of falling: Secondary | ICD-10-CM | POA: Diagnosis not present

## 2018-02-01 DIAGNOSIS — E119 Type 2 diabetes mellitus without complications: Secondary | ICD-10-CM | POA: Diagnosis not present

## 2018-02-01 DIAGNOSIS — M8589 Other specified disorders of bone density and structure, multiple sites: Secondary | ICD-10-CM | POA: Diagnosis not present

## 2018-02-01 DIAGNOSIS — E785 Hyperlipidemia, unspecified: Secondary | ICD-10-CM | POA: Diagnosis not present

## 2018-02-01 DIAGNOSIS — M810 Age-related osteoporosis without current pathological fracture: Secondary | ICD-10-CM | POA: Diagnosis not present

## 2018-02-01 DIAGNOSIS — J019 Acute sinusitis, unspecified: Secondary | ICD-10-CM | POA: Diagnosis not present

## 2018-02-01 DIAGNOSIS — Z1331 Encounter for screening for depression: Secondary | ICD-10-CM | POA: Diagnosis not present

## 2018-02-15 ENCOUNTER — Other Ambulatory Visit: Payer: Self-pay

## 2018-02-15 MED ORDER — RIVAROXABAN 20 MG PO TABS: 20.0000 mg | ORAL_TABLET | Freq: Every day | ORAL | 1 refills | Status: DC

## 2018-03-03 DIAGNOSIS — J3489 Other specified disorders of nose and nasal sinuses: Secondary | ICD-10-CM | POA: Diagnosis not present

## 2018-03-03 DIAGNOSIS — Z6830 Body mass index (BMI) 30.0-30.9, adult: Secondary | ICD-10-CM | POA: Diagnosis not present

## 2018-04-30 ENCOUNTER — Telehealth: Payer: Self-pay

## 2018-04-30 NOTE — Telephone Encounter (Signed)
Pt has agreed to reschedule her upcoming appt due to the corona virus outbreak. She states she is feeling okay but occasionally is SOB. She states she does not regularly take her lasix. She does not have BLE swelling, but has some abdominal swelling. I advised her to begin her lasix and to take, for at least, 3 days to see if this relieves her symptoms. She understands to call the office back if this does not help.   She will call the office if her sx get worse. She understands we will contact her back for rescheduling.

## 2018-05-01 ENCOUNTER — Ambulatory Visit: Payer: PPO | Admitting: Internal Medicine

## 2018-05-03 NOTE — Telephone Encounter (Signed)
New message   Patient would like a call back in reference to the previous message.

## 2018-05-03 NOTE — Telephone Encounter (Signed)
Pt calling to let us know her lasix did not help her SOB. There is a possiblity she may be back in AF at an increased rate. Dr Caryl Comes would like to see her in the office. She has agreed to an OV on Monday 3/23 for evaluation.

## 2018-05-07 ENCOUNTER — Other Ambulatory Visit: Payer: Self-pay

## 2018-05-07 ENCOUNTER — Ambulatory Visit: Payer: PPO | Admitting: Internal Medicine

## 2018-05-07 ENCOUNTER — Encounter (INDEPENDENT_AMBULATORY_CARE_PROVIDER_SITE_OTHER): Payer: Self-pay

## 2018-05-07 ENCOUNTER — Encounter: Payer: Self-pay | Admitting: Internal Medicine

## 2018-05-07 VITALS — BP 141/80 | HR 82 | Ht 63.0 in | Wt 176.4 lb

## 2018-05-07 DIAGNOSIS — I10 Essential (primary) hypertension: Secondary | ICD-10-CM | POA: Diagnosis not present

## 2018-05-07 DIAGNOSIS — I4819 Other persistent atrial fibrillation: Secondary | ICD-10-CM | POA: Diagnosis not present

## 2018-05-07 NOTE — Patient Instructions (Signed)
Medication Instructions:  Your physician recommends that you continue on your current medications as directed. Please refer to the Current Medication list given to you today.   Labwork: None ordered.   Testing/Procedures: None ordered.   Follow-Up: Your physician recommends that you schedule a follow-up appointment in: 4 months with Dr Klein   Any Other Special Instructions Will Be Listed Below (If Applicable).     If you need a refill on your cardiac medications before your next appointment, please call your pharmacy.   

## 2018-05-07 NOTE — Progress Notes (Signed)
Patient Care Team: Nicoletta Dress, MD as PCP - General (Internal Medicine) Deboraha Sprang, MD as Attending Physician (Cardiology)   HPI  Colleen Galloway is a 84 y.o. female Seen in followup for atrial fibrillation for which she is anticoagulated. She had problems with edema on calcium channel blockers and has been managed with beta blockers.  She was diagnosed with breast cancer and underwent partial mastectomy, chemotherapy and radiation therapy. She has now completed treatment.   Her atrial fibrillation has been more problematic of late.  Rivaroxaban *without difficulty  Fatigue is biggest complaint The patient denies chest pain, shortness of breath, nocturnal dyspnea, orthopnea or peripheral edema.  There has been no lightheadedness or syncope.     Intermittent palpitations but not today     Date Cr Hgb  6/18 0.76 13.5  9/19   0.99 14.7     DATE TEST    3/16 Echo    EF 55-60 % PA sys 38  10/18 Echo    EF 55 %  PA Sys 50          Past Medical History:  Diagnosis Date  . Allergy   . Arthritis   . Breast cancer ([place]) 09/17/13   Left iNVASIVE DUCTAL,dcis  . Calculus of kidney    kidney stones  . Carotid artery disease ([place])    Followed by Dr. Scot Dock  . CHF (congestive heart failure) ([place])   . Esophageal reflux   . Insomnia, unspecified   . Lumbosacral spondylosis without myelopathy   . Orthostatic lightheadedness 10/20/2011  . Osteoarthrosis, unspecified whether generalized or localized, unspecified site   . Persistent atrial fibrillation   . PONV (postoperative nausea and vomiting)     Past Surgical History:  Procedure Laterality Date  . BREAST LUMPECTOMY WITH NEEDLE LOCALIZATION AND AXILLARY SENTINEL LYMPH NODE BX Left 10/18/2013   Procedure: LEFT AXILLARY LYMPHATIC MAPPING; INJECTION OF METHYLENE BLUE INTO LEFT BREAST; LEFT BREAST PARTIAL MASTECTOMY AFTER NEEDLE LOCALIZATION; AXILLARY SENTINEL LYMPH NODE BIOPSY;  Surgeon: Jackolyn Confer, MD;   Location: [place];  Service: General;  Laterality: Left;  . DILATION AND CURETTAGE OF UTERUS  1960  . KIDNEY STONE SURGERY    . PORT-A-CATH REMOVAL Right 12/26/2014   Procedure: REMOVAL PORT-A-CATH;  Surgeon: Jackolyn Confer, MD;  Location: [place];  Service: General;  Laterality: Right;  . PORTACATH PLACEMENT N/A 10/18/2013   Procedure: INSERTION PORT-A-CATH/ULTRASOUND GUIDED,RIGHT INTERNAL JUGULAR;  Surgeon: Jackolyn Confer, MD;  Location: [place];  Service: General;  Laterality: N/A;  . varicose veins      Current Outpatient Medications  Medication Sig Dispense Refill  . anastrozole (ARIMIDEX) 1 MG tablet Take 1 mg by mouth daily.    . calcium citrate-vitamin D (CITRACAL+D) 315-200 MG-UNIT per tablet Take 1 tablet by mouth daily.     Marland Kitchen doxycycline (VIBRAMYCIN) 50 MG capsule Take 50 mg by mouth daily as needed.    . fenofibrate 160 MG tablet Take 160 mg by mouth daily after supper.     . furosemide (LASIX) 20 MG tablet Take 1 tablet (20 mg total) by mouth daily. 90 tablet 3  . meclizine (ANTIVERT) 25 MG tablet Take 1 tablet (25 mg total) by mouth 3 (three) times daily as needed for dizziness. 90 tablet 2  . metoprolol succinate (TOPROL-XL) 50 MG 24 hr tablet Take 1 tablet (50 mg total) by mouth daily. 90 tablet 3  . Multiple Vitamin (MULTIVITAMIN) capsule Take 1 capsule by mouth  daily.      . Omega-3 Fatty Acids (EQL OMEGA 3 FISH OIL) 1400 MG CAPS Take 2-4 capsules by mouth See admin instructions. 2-4 capsules 1-2 times daily depending on what patient wants to do    . omeprazole (PRILOSEC) 20 MG capsule Take 20 mg by mouth daily.      . rivaroxaban (XARELTO) 20 MG TABS tablet Take 1 tablet (20 mg total) by mouth daily with supper. 90 tablet 1  . vitamin B-12 (CYANOCOBALAMIN) 1000 MCG tablet Take 1,000 mcg by mouth daily.     No current facility-administered medications for this visit.     Allergies  Allergen Reactions  . Flecainide Shortness Of Breath, Swelling and Other  (See Comments)    Sob, visual problems, swelling ankles  . Nebivolol Shortness Of Breath and Swelling    Sob, visual problems, swelling in ankles  . Diltiazem Hcl Swelling    Pt states it causes her ankle swelling   . Lopressor [Metoprolol Tartrate]     Severe dizziness. Irregular heartbeat   . Sodium Pantothenate Nausea And Vomiting    Review of Systems negative except from HPI and PMH  Physical Exam BP (!) 141/80   Pulse 82   Ht 5\' 3"  (1.6 m)   Wt 176 lb 6.4 oz (80 kg)   BMI 31.25 kg/m  Well developed and nourished in no acute distress HENT normal Neck supple with JVP-flat Carotids brisk and full without bruits Clear Irregularly irregular rate and rhythm with controlled ventricular response, no murmurs or gallops Abd-soft with active BS without hepatomegaly No Clubbing cyanosis edema Skin-warm and dry A & Oriented  Grossly normal sensory and motor function    ECG atrial fib @ 82 -/07/35  Assessment and  Plan  Atrial fibrillation--flutter-persistent rapid ventricular response  Hypertension  HFpEF acute chronic    Breast Cancer-therapy     Pulmonary hypertension    Largely unaware of her afib  From taht point will continue strategy of rate control for now  Wonders whether her fatigue is related to her chemo  For now no changes     .

## 2018-05-08 ENCOUNTER — Ambulatory Visit: Payer: PPO | Admitting: Internal Medicine

## 2018-05-10 DIAGNOSIS — C44622 Squamous cell carcinoma of skin of right upper limb, including shoulder: Secondary | ICD-10-CM | POA: Diagnosis not present

## 2018-05-10 DIAGNOSIS — L719 Rosacea, unspecified: Secondary | ICD-10-CM | POA: Diagnosis not present

## 2018-05-15 ENCOUNTER — Ambulatory Visit: Payer: PPO | Admitting: Internal Medicine

## 2018-06-14 ENCOUNTER — Telehealth: Payer: Self-pay | Admitting: *Deleted

## 2018-06-14 ENCOUNTER — Other Ambulatory Visit: Payer: Self-pay | Admitting: *Deleted

## 2018-06-14 DIAGNOSIS — I83812 Varicose veins of left lower extremities with pain: Secondary | ICD-10-CM

## 2018-06-14 NOTE — Telephone Encounter (Signed)
Attempted calling Colleen Galloway's home phone multiple times with recording stating "your call has been blocked."  Called mobile/cell phone number and voice mail has not been set up so was unable to leave voice message.

## 2018-06-14 NOTE — Telephone Encounter (Signed)
Colleen Galloway called today c/o left ankle and calf tenderness, redness, and skin discoloration.  She says that she experiences a "burning, stinging sensation" that worsens at night.  She states she has no ankle/foot/leg swelling.  No ulcerations.  Encouraged her to elevate legs when sitting, wear compression stockings, and use moisturizers on left ankle and calf.  VVS schedulers to call Colleen Galloway to set up venous reflux (left leg) and VV FU with Dr. Scot Dock as soon as [facility] starts seeing patients as soon as [facility]-VID 19 protocols will allow.  Mr. Vivar aware. Asked her to call [facility] if she has skin ulceration/ breakdown or if leg pain worsens.

## 2018-06-14 NOTE — Telephone Encounter (Signed)
Left detailed telephone voice message on Colleen Galloway' cell phone as Colleen Galloway is emergency contact for [employer].  I was unable to reach Colleen Galloway this morning using her home phone number and cell phone number.  Colleen Galloway had left message with [facility] answering service this morning "wanting to schedule an appointment to have her leg looked at."  Requested via telephone message (on her sister's cell phone) that Colleen Galloway call me today 06-14-2018 before 3:00PM.

## 2018-07-27 ENCOUNTER — Other Ambulatory Visit: Payer: Self-pay | Admitting: Internal Medicine

## 2018-07-27 NOTE — Telephone Encounter (Signed)
Pt last saw Dr Caryl Comes 05/07/18, last labs 10/31/17 Creat 0.99, age 84, weight 80kg, CrCl 56.29, based on CrCl pt is on appropriate dosage of Xarelto 20mg  QD.  Will refill rx.

## 2018-08-02 ENCOUNTER — Telehealth: Payer: Self-pay | Admitting: *Deleted

## 2018-08-03 DIAGNOSIS — I480 Paroxysmal atrial fibrillation: Secondary | ICD-10-CM | POA: Diagnosis not present

## 2018-08-03 DIAGNOSIS — E785 Hyperlipidemia, unspecified: Secondary | ICD-10-CM | POA: Diagnosis not present

## 2018-08-03 DIAGNOSIS — E1169 Type 2 diabetes mellitus with other specified complication: Secondary | ICD-10-CM | POA: Diagnosis not present

## 2018-08-03 DIAGNOSIS — Z79899 Other long term (current) drug therapy: Secondary | ICD-10-CM | POA: Diagnosis not present

## 2018-08-03 DIAGNOSIS — M810 Age-related osteoporosis without current pathological fracture: Secondary | ICD-10-CM | POA: Diagnosis not present

## 2018-08-03 DIAGNOSIS — I872 Venous insufficiency (chronic) (peripheral): Secondary | ICD-10-CM | POA: Diagnosis not present

## 2018-08-13 ENCOUNTER — Telehealth: Payer: Self-pay | Admitting: *Deleted

## 2018-11-06 DIAGNOSIS — Z859 Personal history of malignant neoplasm, unspecified: Secondary | ICD-10-CM | POA: Diagnosis not present

## 2018-11-06 DIAGNOSIS — N6321 Unspecified lump in the left breast, upper outer quadrant: Secondary | ICD-10-CM | POA: Diagnosis not present

## 2018-11-06 DIAGNOSIS — Z853 Personal history of malignant neoplasm of breast: Secondary | ICD-10-CM | POA: Diagnosis not present

## 2018-11-06 DIAGNOSIS — R928 Other abnormal and inconclusive findings on diagnostic imaging of breast: Secondary | ICD-10-CM | POA: Diagnosis not present

## 2018-11-14 DIAGNOSIS — C50512 Malignant neoplasm of lower-outer quadrant of left female breast: Secondary | ICD-10-CM | POA: Diagnosis not present

## 2018-11-14 DIAGNOSIS — M8589 Other specified disorders of bone density and structure, multiple sites: Secondary | ICD-10-CM | POA: Diagnosis not present

## 2018-11-14 DIAGNOSIS — Z79811 Long term (current) use of aromatase inhibitors: Secondary | ICD-10-CM | POA: Diagnosis not present

## 2018-11-14 DIAGNOSIS — C50212 Malignant neoplasm of upper-inner quadrant of left female breast: Secondary | ICD-10-CM | POA: Diagnosis not present

## 2018-11-20 ENCOUNTER — Other Ambulatory Visit: Payer: Self-pay | Admitting: Internal Medicine

## 2018-12-27 DIAGNOSIS — N898 Other specified noninflammatory disorders of vagina: Secondary | ICD-10-CM | POA: Diagnosis not present

## 2018-12-27 DIAGNOSIS — N390 Urinary tract infection, site not specified: Secondary | ICD-10-CM | POA: Diagnosis not present

## 2019-01-15 ENCOUNTER — Ambulatory Visit: Payer: PPO | Admitting: Internal Medicine

## 2019-01-15 ENCOUNTER — Other Ambulatory Visit: Payer: Self-pay

## 2019-01-15 ENCOUNTER — Encounter: Payer: Self-pay | Admitting: Internal Medicine

## 2019-01-15 ENCOUNTER — Encounter (INDEPENDENT_AMBULATORY_CARE_PROVIDER_SITE_OTHER): Payer: Self-pay

## 2019-01-15 VITALS — BP 142/80 | HR 84 | Ht 63.0 in | Wt 175.0 lb

## 2019-01-15 DIAGNOSIS — I5032 Chronic diastolic (congestive) heart failure: Secondary | ICD-10-CM

## 2019-01-15 DIAGNOSIS — I10 Essential (primary) hypertension: Secondary | ICD-10-CM | POA: Diagnosis not present

## 2019-01-15 DIAGNOSIS — I4819 Other persistent atrial fibrillation: Secondary | ICD-10-CM

## 2019-01-15 NOTE — Progress Notes (Signed)
Patient Care Team: Nicoletta Dress, MD as PCP - General (Internal Medicine) Deboraha Sprang, MD as Attending Physician (Cardiology)   HPI  Colleen Galloway is a 84 y.o. female Seen in followup for atrial fibrillation-permanent for which she is anticoagulated. She had problems with edema on calcium channel blockers and has been managed with beta blockers.  She was diagnosed with breast cancer and underwent partial mastectomy, chemotherapy and radiation therapy. She has now completed treatment.   No chest pain.  Scant edema.  No orthopnea nocturnal dyspnea.  Some palpitations with shortness of breath  She went to go get her 90-day Xarelto prescription and it was $1700 between her and her insurance company.  WhOW   Date Cr Hgb  6/18 0.76 13.5  9/19   0.99 14.7     DATE TEST    3/16 Echo    EF 55-60 % PA sys 38  10/18 Echo    EF 55 %  PA Sys 50          Past Medical History:  Diagnosis Date  . Allergy   . Arthritis   . Breast cancer ([place]) 09/17/13   Left iNVASIVE DUCTAL,dcis  . Calculus of kidney    kidney stones  . Carotid artery disease ([place])    Followed by Dr. Scot Dock  . CHF (congestive heart failure) ([place])   . Esophageal reflux   . Insomnia, unspecified   . Lumbosacral spondylosis without myelopathy   . Orthostatic lightheadedness 10/20/2011  . Osteoarthrosis, unspecified whether generalized or localized, unspecified site   . Persistent atrial fibrillation ([place])   . PONV (postoperative nausea and vomiting)     Past Surgical History:  Procedure Laterality Date  . BREAST LUMPECTOMY WITH NEEDLE LOCALIZATION AND AXILLARY SENTINEL LYMPH NODE BX Left 10/18/2013   Procedure: LEFT AXILLARY LYMPHATIC MAPPING; INJECTION OF METHYLENE BLUE INTO LEFT BREAST; LEFT BREAST PARTIAL MASTECTOMY AFTER NEEDLE LOCALIZATION; AXILLARY SENTINEL LYMPH NODE BIOPSY;  Surgeon: Jackolyn Confer, MD;  Location: [facility];  Service: General;  Laterality: Left;  . DILATION AND CURETTAGE OF UTERUS   1960  . KIDNEY STONE SURGERY    . PORT-A-CATH REMOVAL Right 12/26/2014   Procedure: REMOVAL PORT-A-CATH;  Surgeon: Jackolyn Confer, MD;  Location: [facility];  Service: General;  Laterality: Right;  . PORTACATH PLACEMENT N/A 10/18/2013   Procedure: INSERTION PORT-A-CATH/ULTRASOUND GUIDED,RIGHT INTERNAL JUGULAR;  Surgeon: Jackolyn Confer, MD;  Location: [facility];  Service: General;  Laterality: N/A;  . varicose veins      Current Outpatient Medications  Medication Sig Dispense Refill  . anastrozole (ARIMIDEX) 1 MG tablet Take 1 mg by mouth daily.    . calcium citrate-vitamin D (CITRACAL+D) 315-200 MG-UNIT per tablet Take 1 tablet by mouth daily.     Marland Kitchen doxycycline (VIBRAMYCIN) 50 MG capsule Take 50 mg by mouth daily as needed.    . fenofibrate 160 MG tablet Take 160 mg by mouth daily after supper.     . furosemide (LASIX) 20 MG tablet Take 1 tablet (20 mg total) by mouth daily. 90 tablet 3  . meclizine (ANTIVERT) 25 MG tablet Take 1 tablet (25 mg total) by mouth 3 (three) times daily as needed for dizziness. 90 tablet 2  . metoprolol succinate (TOPROL-XL) 50 MG 24 hr tablet Take 1 tablet (50 mg total) by mouth daily. Pt needs to keep upcoming appt in Dec for further refills 90 tablet 3  . Multiple Vitamin (MULTIVITAMIN) capsule Take 1 capsule by  mouth daily.      . Omega-3 Fatty Acids (EQL OMEGA 3 FISH OIL) 1400 MG CAPS Take 2-4 capsules by mouth See admin instructions. 2-4 capsules 1-2 times daily depending on what patient wants to do    . omeprazole (PRILOSEC) 20 MG capsule Take 20 mg by mouth daily.      . vitamin B-12 (CYANOCOBALAMIN) 1000 MCG tablet Take 1,000 mcg by mouth daily.    Alveda Reasons 20 MG TABS tablet TAKE 1 TABLET(20 MG) BY MOUTH DAILY WITH SUPPER 90 tablet 1   No current facility-administered medications for this visit.     Allergies  Allergen Reactions  . Flecainide Shortness Of Breath, Swelling and Other (See Comments)    Sob, visual problems, swelling ankles   . Nebivolol Shortness Of Breath and Swelling    Sob, visual problems, swelling in ankles  . Diltiazem Hcl Swelling    Pt states it causes her ankle swelling   . Lopressor [Metoprolol Tartrate]     Severe dizziness. Irregular heartbeat   . Sodium Pantothenate Nausea And Vomiting    Review of Systems negative except from HPI and PMH  Physical Exam BP (!) 142/80   Pulse 84   Ht 5\' 3"  (1.6 m)   Wt 175 lb (79.4 kg)   SpO2 99%   BMI 31.00 kg/m  Well developed and nourished in no acute distress HENT normal Neck supple with JVP-flat Carotids brisk and full without bruits Clear Irregularly irregular rate and rhythm with controlled ventricular response, no murmurs or gallops Abd-soft with active BS without hepatomegaly No Clubbing cyanosis edema Skin-warm and dry A & Oriented  Grossly normal sensory and motor function  ECG: AF   @84 \            Intervals  -/07//36  Axis -13     Assessment and  Plan  Atrial fibrillation--flutter-persistent rapid ventricular response  Hypertension  HFpEF acute chronic    Breast Cancer-therapy     Pulmonary hypertension      We will plan to repeat the echo to look at her pulmonary hypertension.  Blood pressure is reasonably controlled; however, she continues to have some rapid rates with atrial fibrillation so we will increase her metoprolol from 50 mg daily--twice daily.  It should also help with blood pressure.  We will look into anticoagulation alternatives given the cost of the Xarelto.  We spent more than 50% of our >25 min visit in face to face counseling regarding the above   .

## 2019-01-15 NOTE — Patient Instructions (Addendum)
Medication Instructions:  Your physician recommends that you continue on your current medications as directed. Please refer to the Current Medication list given to you today.  Please call office after you have researched blood thinner prices and let us know what you would like to take going forward (Eliquis, Xarelto, Pradaxa)  Labwork: None ordered.  Testing/Procedures: None ordered.  Follow-Up: Your physician wants you to follow-up in: 6 months with Dr. Caryl Comes.   You will receive a reminder letter in the mail two months in advance. If you don't receive a letter, please call our office to schedule the follow-up appointment.  Any Other Special Instructions Will Be Listed Below (If Applicable).  If you need a refill on your cardiac medications before your next appointment, please call your pharmacy.

## 2019-01-18 ENCOUNTER — Telehealth: Payer: Self-pay | Admitting: Internal Medicine

## 2019-01-18 NOTE — Telephone Encounter (Signed)
Spoke with pt who states she spoke with her insurance company re: cost of Xarelto vs  Eliquis.  Pt states she will continue on Xarelto.

## 2019-01-18 NOTE — Telephone Encounter (Signed)
Patient returning call. States she will be home the rest of the day.

## 2019-01-18 NOTE — Telephone Encounter (Signed)
Patient calling to speak to nurse, she wants to know the cost of her blood thinner medication.  Which of the three would be better?

## 2019-01-21 DIAGNOSIS — H524 Presbyopia: Secondary | ICD-10-CM | POA: Diagnosis not present

## 2019-01-23 ENCOUNTER — Other Ambulatory Visit: Payer: Self-pay

## 2019-01-23 MED ORDER — RIVAROXABAN 20 MG PO TABS: ORAL_TABLET | ORAL | 1 refills | Status: DC

## 2019-01-23 NOTE — Telephone Encounter (Signed)
Pt last saw Dr Caryl Comes 01/15/19, last labs 08/03/18 Creat 1.07 per [facility] at [facility] age 84, weight 79.4kg, CrCl 50.81, based on CrCl pt is on appropriate dosage of Xarelto 20mg  QD.  Will refill rx.

## 2019-02-04 DIAGNOSIS — I872 Venous insufficiency (chronic) (peripheral): Secondary | ICD-10-CM | POA: Diagnosis not present

## 2019-02-04 DIAGNOSIS — Z79899 Other long term (current) drug therapy: Secondary | ICD-10-CM | POA: Diagnosis not present

## 2019-02-04 DIAGNOSIS — M810 Age-related osteoporosis without current pathological fracture: Secondary | ICD-10-CM | POA: Diagnosis not present

## 2019-02-04 DIAGNOSIS — E785 Hyperlipidemia, unspecified: Secondary | ICD-10-CM | POA: Diagnosis not present

## 2019-02-04 DIAGNOSIS — I480 Paroxysmal atrial fibrillation: Secondary | ICD-10-CM | POA: Diagnosis not present

## 2019-02-04 DIAGNOSIS — Z139 Encounter for screening, unspecified: Secondary | ICD-10-CM | POA: Diagnosis not present

## 2019-02-04 DIAGNOSIS — E1169 Type 2 diabetes mellitus with other specified complication: Secondary | ICD-10-CM | POA: Diagnosis not present

## 2019-02-18 ENCOUNTER — Other Ambulatory Visit: Payer: Self-pay

## 2019-02-18 MED ORDER — RIVAROXABAN 20 MG PO TABS: ORAL_TABLET | ORAL | 3 refills | Status: DC

## 2019-02-18 NOTE — Progress Notes (Signed)
Letter received from pt. Per pt request refill Xarelto 20mg  tablets #90 with 3 refills sent to [facility] on file in pt's chart.  Copy of pt's letter placed for scan into chart.

## 2019-05-20 DIAGNOSIS — M8589 Other specified disorders of bone density and structure, multiple sites: Secondary | ICD-10-CM | POA: Diagnosis not present

## 2019-05-20 DIAGNOSIS — M810 Age-related osteoporosis without current pathological fracture: Secondary | ICD-10-CM | POA: Diagnosis not present

## 2019-07-17 ENCOUNTER — Other Ambulatory Visit: Payer: Self-pay

## 2019-07-17 MED ORDER — METOPROLOL SUCCINATE ER 50 MG PO TB24: 50.0000 mg | ORAL_TABLET | Freq: Every day | ORAL | 1 refills | Status: DC

## 2019-07-17 NOTE — Telephone Encounter (Signed)
Pt calling stating that Dr. Caryl Comes told her to take metoprolol 50 mg, 1 1/2 tablets daily, per LOV 12/20. This was not changed on pt's medication list and pt states that she is almost out of this medication and would like for her Rx to be sent in correctly. Pt would like a call back concerning this matter. Please address

## 2019-07-23 MED ORDER — METOPROLOL SUCCINATE ER 50 MG PO TB24: 50.0000 mg | ORAL_TABLET | Freq: Two times a day (BID) | ORAL | 1 refills | Status: DC

## 2019-07-23 NOTE — Telephone Encounter (Signed)
Attempted phone call to pt's home phone and cell phone.  Unable to leave voicemail messages on either phone.

## 2019-08-05 DIAGNOSIS — J320 Chronic maxillary sinusitis: Secondary | ICD-10-CM | POA: Diagnosis not present

## 2019-08-05 DIAGNOSIS — E785 Hyperlipidemia, unspecified: Secondary | ICD-10-CM | POA: Diagnosis not present

## 2019-08-05 DIAGNOSIS — Z9181 History of falling: Secondary | ICD-10-CM | POA: Diagnosis not present

## 2019-08-05 DIAGNOSIS — I872 Venous insufficiency (chronic) (peripheral): Secondary | ICD-10-CM | POA: Diagnosis not present

## 2019-08-05 DIAGNOSIS — Z79899 Other long term (current) drug therapy: Secondary | ICD-10-CM | POA: Diagnosis not present

## 2019-08-05 DIAGNOSIS — M810 Age-related osteoporosis without current pathological fracture: Secondary | ICD-10-CM | POA: Diagnosis not present

## 2019-08-05 DIAGNOSIS — Z1331 Encounter for screening for depression: Secondary | ICD-10-CM | POA: Diagnosis not present

## 2019-08-05 DIAGNOSIS — I480 Paroxysmal atrial fibrillation: Secondary | ICD-10-CM | POA: Diagnosis not present

## 2019-08-05 DIAGNOSIS — E1169 Type 2 diabetes mellitus with other specified complication: Secondary | ICD-10-CM | POA: Diagnosis not present

## 2019-08-06 LAB — CBC: RBC: 4.66 (ref 3.87–5.11)

## 2019-08-06 LAB — LIPID PANEL
Cholesterol: 167 (ref 0–200)
HDL: 47 (ref 35–70)
LDL Cholesterol: 97
LDl/HDL Ratio: 2.1
Triglycerides: 131 (ref 40–160)

## 2019-08-06 LAB — BASIC METABOLIC PANEL
BUN: 13 (ref 4–21)
CO2: 24 — AB (ref 13–22)
Chloride: 104 (ref 99–108)
Creatinine: 1 (ref 0.5–1.1)
Glucose: 123
Potassium: 4.7 (ref 3.4–5.3)
Sodium: 139 (ref 137–147)

## 2019-08-06 LAB — CBC AND DIFFERENTIAL
HCT: 41 (ref 36–46)
Hemoglobin: 13.5 (ref 12.0–16.0)
Neutrophils Absolute: 3.7
Platelets: 191 (ref 150–399)
WBC: 5.5

## 2019-08-06 LAB — HEPATIC FUNCTION PANEL
ALT: 29 (ref 7–35)
AST: 39 — AB (ref 13–35)
Alkaline Phosphatase: 87 (ref 25–125)
Bilirubin, Total: 0.4

## 2019-08-06 LAB — HEMOGLOBIN A1C: Hemoglobin A1C: 6.8

## 2019-08-06 LAB — COMPREHENSIVE METABOLIC PANEL
Albumin: 4.1 (ref 3.5–5.0)
Calcium: 9.8 (ref 8.7–10.7)

## 2019-08-29 DIAGNOSIS — N390 Urinary tract infection, site not specified: Secondary | ICD-10-CM | POA: Diagnosis not present

## 2019-10-01 ENCOUNTER — Encounter: Payer: Self-pay | Admitting: Internal Medicine

## 2019-10-01 ENCOUNTER — Other Ambulatory Visit: Payer: Self-pay

## 2019-10-01 ENCOUNTER — Ambulatory Visit: Payer: PPO | Admitting: Internal Medicine

## 2019-10-01 VITALS — BP 126/64 | HR 74 | Ht 64.0 in | Wt 176.0 lb

## 2019-10-01 DIAGNOSIS — I4819 Other persistent atrial fibrillation: Secondary | ICD-10-CM | POA: Diagnosis not present

## 2019-10-01 NOTE — Patient Instructions (Addendum)
Medication Instructions:  Your physician recommends that you continue on your current medications as directed. Please refer to the Current Medication list given to you today. *If you need a refill on your cardiac medications before your next appointment, please call your pharmacy*   Lab Work: None ordered.  If you have labs (blood work) drawn today and your tests are completely normal, you will receive your results only by: Marland Kitchen MyChart Message (if you have MyChart) OR . A paper copy in the mail If you have any lab test that is abnormal or we need to change your treatment, we will call you to review the results.   Testing/Procedures: None ordered.    Follow-Up: At [facility], you and your health needs are our priority.  As part of our continuing mission to provide you with exceptional heart care, we have created designated Provider Care Teams.  These Care Teams include your primary Cardiologist (physician) and Advanced Practice Providers (APPs -  Physician Assistants and Nurse Practitioners) who all work together to provide you with the care you need, when you need it.  We recommend signing up for the patient portal called "MyChart".  Sign up information is provided on this After Visit Summary.  MyChart is used to connect with patients for Virtual Visits (Telemedicine).  Patients are able to view lab/test results, encounter notes, upcoming appointments, etc.  Non-urgent messages can be sent to your provider as well.   To learn more about what you can do with MyChart, go to [email or web address].    Your next appointment:  Follow up with Dr Caryl Comes in one year.

## 2019-10-01 NOTE — Progress Notes (Signed)
Patient Care Team: Nicoletta Dress, MD as PCP - General (Internal Medicine) Deboraha Sprang, MD as Attending Physician (Cardiology)   HPI  Colleen Galloway is a 85 y.o. female Seen in followup for atrial fibrillation-permanent for which she is anticoagulated. She had problems with edema on calcium channel blockers and has been managed with beta blockers.  She was diagnosed with breast cancer and underwent partial mastectomy, chemotherapy and radiation therapy. She has now completed treatment.     No bleeding.  Mild shortness of breath.  Stable.  No edema.  No chest pain.  BP well controlled at home    Date Cr Hgb  6/18 0.76 13.5  9/19   0.99 14.7     DATE TEST EF   3/16 Echo   55-60 % PA sys 38  10/18 Echo   55 %  PA Sys 50          Past Medical History:  Diagnosis Date  . Allergy   . Arthritis   . Breast cancer ([place]) 09/17/13   Left iNVASIVE DUCTAL,dcis  . Calculus of kidney    kidney stones  . Carotid artery disease ([place])    Followed by Dr. Scot Dock  . CHF (congestive heart failure) ([place])   . Esophageal reflux   . Insomnia, unspecified   . Lumbosacral spondylosis without myelopathy   . Orthostatic lightheadedness 10/20/2011  . Osteoarthrosis, unspecified whether generalized or localized, unspecified site   . Persistent atrial fibrillation ([place])   . PONV (postoperative nausea and vomiting)     Past Surgical History:  Procedure Laterality Date  . BREAST LUMPECTOMY WITH NEEDLE LOCALIZATION AND AXILLARY SENTINEL LYMPH NODE BX Left 10/18/2013   Procedure: LEFT AXILLARY LYMPHATIC MAPPING; INJECTION OF METHYLENE BLUE INTO LEFT BREAST; LEFT BREAST PARTIAL MASTECTOMY AFTER NEEDLE LOCALIZATION; AXILLARY SENTINEL LYMPH NODE BIOPSY;  Surgeon: Jackolyn Confer, MD;  Location: [facility];  Service: General;  Laterality: Left;  . DILATION AND CURETTAGE OF UTERUS  1960  . KIDNEY STONE SURGERY    . PORT-A-CATH REMOVAL Right 12/26/2014   Procedure: REMOVAL PORT-A-CATH;   Surgeon: Jackolyn Confer, MD;  Location: [facility];  Service: General;  Laterality: Right;  . PORTACATH PLACEMENT N/A 10/18/2013   Procedure: INSERTION PORT-A-CATH/ULTRASOUND GUIDED,RIGHT INTERNAL JUGULAR;  Surgeon: Jackolyn Confer, MD;  Location: [place];  Service: General;  Laterality: N/A;  . varicose veins      Current Outpatient Medications  Medication Sig Dispense Refill  . calcium citrate-vitamin D (CITRACAL+D) 315-200 MG-UNIT per tablet Take 1 tablet by mouth daily.     Marland Kitchen doxycycline (VIBRAMYCIN) 50 MG capsule Take 50 mg by mouth daily as needed.    . fenofibrate 160 MG tablet Take 160 mg by mouth daily after supper.     . furosemide (LASIX) 20 MG tablet Take 1 tablet (20 mg total) by mouth daily. 90 tablet 3  . meclizine (ANTIVERT) 25 MG tablet Take 1 tablet (25 mg total) by mouth 3 (three) times daily as needed for dizziness. 90 tablet 2  . metoprolol succinate (TOPROL-XL) 50 MG 24 hr tablet Take 1 tablet (50 mg total) by mouth 2 (two) times daily. 180 tablet 1  . Multiple Vitamin (MULTIVITAMIN) capsule Take 1 capsule by mouth daily.      . Omega-3 Fatty Acids (EQL OMEGA 3 FISH OIL) 1400 MG CAPS Take 2-4 capsules by mouth See admin instructions. 2-4 capsules 1-2 times daily depending on what patient wants to do    .  omeprazole (PRILOSEC) 20 MG capsule Take 20 mg by mouth daily.      . rivaroxaban (XARELTO) 20 MG TABS tablet TAKE 1 TABLET(20 MG) BY MOUTH DAILY WITH SUPPER 90 tablet 3  . vitamin B-12 (CYANOCOBALAMIN) 1000 MCG tablet Take 1,000 mcg by mouth daily.     No current facility-administered medications for this visit.    Allergies  Allergen Reactions  . Flecainide Shortness Of Breath, Swelling and Other (See Comments)    Sob, visual problems, swelling ankles  . Nebivolol Shortness Of Breath and Swelling    Sob, visual problems, swelling in ankles  . Diltiazem Hcl Swelling    Pt states it causes her ankle swelling   . Lopressor [Metoprolol Tartrate]      Severe dizziness. Irregular heartbeat   . Sodium Pantothenate Nausea And Vomiting    Review of Systems negative except from HPI and PMH  Physical Exam BP 126/64   Pulse 74   Ht 5\' 4"  (1.626 m)   Wt 176 lb (79.8 kg)   SpO2 96%   BMI 30.21 kg/m  Well developed and nourished in no acute distress HENT normal Neck supple   Clear Irregularly irregular rate and rhythm with controlled ventricular response, no murmurs or gallops Abd-soft with active BS  No Clubbing cyanosis edema Skin-warm and dry A & Oriented  Grossly normal sensory and motor function  ECG atrial fibrillation at 70 Intervals-/07/39    Assessment and  Plan  Atrial fibrillation--flutter-persistent rapid ventricular response  Hypertension  HFpEF chronic    Breast Cancer-therapy     Pulmonary hypertension    Blood pressure well controlled.  Euvolemic continue current meds  On Anticoagulation;  No bleeding issues    .

## 2019-10-11 DIAGNOSIS — L814 Other melanin hyperpigmentation: Secondary | ICD-10-CM | POA: Diagnosis not present

## 2019-10-11 DIAGNOSIS — L821 Other seborrheic keratosis: Secondary | ICD-10-CM | POA: Diagnosis not present

## 2019-10-11 DIAGNOSIS — L570 Actinic keratosis: Secondary | ICD-10-CM | POA: Diagnosis not present

## 2019-10-11 DIAGNOSIS — D1801 Hemangioma of skin and subcutaneous tissue: Secondary | ICD-10-CM | POA: Diagnosis not present

## 2019-10-15 ENCOUNTER — Other Ambulatory Visit: Payer: Self-pay

## 2019-10-15 DIAGNOSIS — R0989 Other specified symptoms and signs involving the circulatory and respiratory systems: Secondary | ICD-10-CM

## 2019-10-23 ENCOUNTER — Encounter: Payer: Self-pay | Admitting: Vascular Surgery

## 2019-10-23 ENCOUNTER — Ambulatory Visit (HOSPITAL_COMMUNITY)
Admission: RE | Admit: 2019-10-23 | Discharge: 2019-10-23 | Disposition: A | Discharge status: Home / Self Care | Payer: PPO | Source: Ambulatory Visit | Attending: Internal Medicine | Admitting: Internal Medicine

## 2019-10-23 ENCOUNTER — Other Ambulatory Visit: Payer: Self-pay

## 2019-10-23 ENCOUNTER — Ambulatory Visit (INDEPENDENT_AMBULATORY_CARE_PROVIDER_SITE_OTHER): Payer: PPO | Admitting: Vascular Surgery

## 2019-10-23 VITALS — BP 140/70 | HR 66 | Temp 97.9°F | Resp 20 | Ht 64.0 in | Wt 179.5 lb

## 2019-10-23 DIAGNOSIS — I6521 Occlusion and stenosis of right carotid artery: Secondary | ICD-10-CM

## 2019-10-23 DIAGNOSIS — I872 Venous insufficiency (chronic) (peripheral): Secondary | ICD-10-CM

## 2019-10-23 DIAGNOSIS — R0989 Other specified symptoms and signs involving the circulatory and respiratory systems: Secondary | ICD-10-CM

## 2019-10-23 NOTE — Progress Notes (Signed)
REASON FOR VISIT:   Follow-up of right carotid stenosis  MEDICAL ISSUES:   RIGHT CAROTID STENOSIS: Based on her duplex she has a greater than 50% stenosis of the distal right common carotid artery.  There is no significant stenosis on the left.  She is asymptomatic.  She is on a statin.  She does not take aspirin because she is on Xarelto.  I have ordered a follow-up carotid duplex scan in 1 year and I will see her back at that time.  She understands we would not consider right carotid endarterectomy unless she developed right hemispheric symptoms or the stenosis progressed to greater than 80%.  I will see her back in 1 year.  She knows to call sooner if she has problems.  CHRONIC VENOUS INSUFFICIENCY: This patient has CEAP C4 venous disease.  We have discussed the importance of intermittent leg elevation and the proper positioning for this.  I encouraged her to avoid prolonged sitting and standing.  I have encouraged her to wear her knee-high compression stockings when she is going to be on her feet a lot.  We also discussed the importance of exercise specifically walking and water aerobics.  If her varicose veins her symptoms progress then we can schedule her for formal venous reflux testing.   HPI:   Colleen Galloway is a pleasant 85 y.o. female who I been following with a moderate right carotid stenosis.  She comes in for routine follow-up visit.  Since I saw her last, she denies any history of stroke, TIAs, expressive or receptive aphasia, or amaurosis fugax.  She does have a history of venous insufficiency and complains of some aching pain in her legs which is associated with standing and relieved somewhat with elevation.  She has not been wearing compression stockings because they are too hot in the summer.  I do not get any history of claudication or rest pain.  She is on a statin.  She is not on aspirin because she is on Xarelto for paroxysmal atrial fibrillation.  Past Medical  History:  Diagnosis Date  . Allergy   . Arthritis   . Breast cancer ([place]) 09/17/13   Left iNVASIVE DUCTAL,dcis  . Calculus of kidney    kidney stones  . Carotid artery disease ([place])    Followed by Dr. Scot Dock  . CHF (congestive heart failure) ([place])   . Esophageal reflux   . Insomnia, unspecified   . Lumbosacral spondylosis without myelopathy   . Orthostatic lightheadedness 10/20/2011  . Osteoarthrosis, unspecified whether generalized or localized, unspecified site   . Persistent atrial fibrillation ([place])   . PONV (postoperative nausea and vomiting)     Family History  Problem Relation Age of Onset  . Heart disease Mother   . Asthma Mother   . Hypertension Mother   . Deep vein thrombosis Mother   . Other Mother        varicose veins  . Lung cancer Mother 67       non-smoker  . Heart disease Father   . Hypertension Father   . Stroke Father   . Heart disease Sister   . Hypertension Sister   . Hyperlipidemia Sister   . Rectal cancer Sister 40  . Heart disease Brother   . Asthma Brother   . Hypertension Brother   . Deep vein thrombosis Brother   . Diabetes Brother   . Colon cancer Other 31       maternal cousin's son; NOS  .  Colon cancer Maternal Grandfather 43  . Liver disease Brother 40       Cirrhosis due to infection from gallbladder  . Melanoma Other     SOCIAL HISTORY: Social History   Tobacco Use  . Smoking status: Never Smoker  . Smokeless tobacco: Never Used  Substance Use Topics  . Alcohol use: No    Allergies  Allergen Reactions  . Flecainide Shortness Of Breath, Swelling and Other (See Comments)    Sob, visual problems, swelling ankles  . Nebivolol Shortness Of Breath and Swelling    Sob, visual problems, swelling in ankles  . Diltiazem Hcl Swelling    Pt states it causes her ankle swelling   . Lopressor [Metoprolol Tartrate]     Severe dizziness. Irregular heartbeat   . Sodium Pantothenate Nausea And Vomiting    Current Outpatient  Medications  Medication Sig Dispense Refill  . calcium citrate-vitamin D (CITRACAL+D) 315-200 MG-UNIT per tablet Take 1 tablet by mouth daily.     Marland Kitchen doxycycline (VIBRAMYCIN) 50 MG capsule Take 50 mg by mouth daily as needed.    . fenofibrate 160 MG tablet Take 160 mg by mouth daily after supper.     . furosemide (LASIX) 20 MG tablet Take 1 tablet (20 mg total) by mouth daily. 90 tablet 3  . meclizine (ANTIVERT) 25 MG tablet Take 1 tablet (25 mg total) by mouth 3 (three) times daily as needed for dizziness. 90 tablet 2  . metoprolol succinate (TOPROL-XL) 50 MG 24 hr tablet Take 1 tablet (50 mg total) by mouth 2 (two) times daily. 180 tablet 1  . Multiple Vitamin (MULTIVITAMIN) capsule Take 1 capsule by mouth daily.      . Omega-3 Fatty Acids (EQL OMEGA 3 FISH OIL) 1400 MG CAPS Take 2-4 capsules by mouth See admin instructions. 2-4 capsules 1-2 times daily depending on what patient wants to do    . omeprazole (PRILOSEC) 20 MG capsule Take 20 mg by mouth daily.      . rivaroxaban (XARELTO) 20 MG TABS tablet TAKE 1 TABLET(20 MG) BY MOUTH DAILY WITH SUPPER 90 tablet 3  . vitamin B-12 (CYANOCOBALAMIN) 1000 MCG tablet Take 1,000 mcg by mouth daily.     No current facility-administered medications for this visit.    REVIEW OF SYSTEMS:  [X]  denotes positive finding, [ ]  denotes negative finding Cardiac  Comments:  Chest pain or chest pressure:    Shortness of breath upon exertion:    Short of breath when lying flat:    Irregular heart rhythm:        Vascular    Pain in calf, thigh, or hip brought on by ambulation:    Pain in feet at night that wakes you up from your sleep:     Blood clot in your veins:    Leg swelling:  x       Pulmonary    Oxygen at home:    Productive cough:     Wheezing:         Neurologic    Sudden weakness in arms or legs:     Sudden numbness in arms or legs:     Sudden onset of difficulty speaking or slurred speech:    Temporary loss of vision in one eye:       Problems with dizziness:         Gastrointestinal    Blood in stool:     Vomited blood:         Genitourinary  Burning when urinating:     Blood in urine:        Psychiatric    Major depression:         Hematologic    Bleeding problems:    Problems with blood clotting too easily:        Skin    Rashes or ulcers:        Constitutional    Fever or chills:     PHYSICAL EXAM:   Vitals:   10/23/19 1315  BP: 140/70  Pulse: 66  Resp: 20  Temp: 97.9 F (36.6 C)  SpO2: 99%  Weight: 179 lb 8 oz (81.4 kg)  Height: 5\' 4"  (1.626 m)    GENERAL: The patient is a well-nourished female, in no acute distress. The vital signs are documented above. CARDIAC: There is a regular rate and rhythm.  VASCULAR: I do not detect carotid bruits. She has palpable posterior tibial pulses bilaterally. She has mild bilateral lower extremity leg swelling. She does have hyperpigmentation bilaterally. PULMONARY: There is good air exchange bilaterally without wheezing or rales. ABDOMEN: Soft and non-tender with normal pitched bowel sounds.  MUSCULOSKELETAL: There are no major deformities or cyanosis. NEUROLOGIC: No focal weakness or paresthesias are detected. SKIN: There are no ulcers or rashes noted. PSYCHIATRIC: The patient has a normal affect.  DATA:    CAROTID DUPLEX: I have independently interpreted her carotid duplex scan.  On the right side she has a greater than 50% distal right common carotid artery stenosis.  Right vertebral artery is patent with antegrade flow.  On the left side she has a less than 39% stenosis.  The left vertebral artery is patent with antegrade flow.  Deitra Mayo [facility] [phone]

## 2019-11-07 DIAGNOSIS — Z1231 Encounter for screening mammogram for malignant neoplasm of breast: Secondary | ICD-10-CM | POA: Diagnosis not present

## 2019-11-14 DIAGNOSIS — Z853 Personal history of malignant neoplasm of breast: Secondary | ICD-10-CM | POA: Diagnosis not present

## 2019-12-04 DIAGNOSIS — N390 Urinary tract infection, site not specified: Secondary | ICD-10-CM | POA: Diagnosis not present

## 2020-01-10 DIAGNOSIS — N390 Urinary tract infection, site not specified: Secondary | ICD-10-CM | POA: Diagnosis not present

## 2020-01-10 DIAGNOSIS — J3489 Other specified disorders of nose and nasal sinuses: Secondary | ICD-10-CM | POA: Diagnosis not present

## 2020-01-20 DIAGNOSIS — N952 Postmenopausal atrophic vaginitis: Secondary | ICD-10-CM | POA: Diagnosis not present

## 2020-01-20 DIAGNOSIS — Z79899 Other long term (current) drug therapy: Secondary | ICD-10-CM | POA: Diagnosis not present

## 2020-01-20 DIAGNOSIS — N390 Urinary tract infection, site not specified: Secondary | ICD-10-CM | POA: Diagnosis not present

## 2020-01-20 DIAGNOSIS — N3946 Mixed incontinence: Secondary | ICD-10-CM | POA: Diagnosis not present

## 2020-02-04 DIAGNOSIS — I872 Venous insufficiency (chronic) (peripheral): Secondary | ICD-10-CM | POA: Diagnosis not present

## 2020-02-04 DIAGNOSIS — M810 Age-related osteoporosis without current pathological fracture: Secondary | ICD-10-CM | POA: Diagnosis not present

## 2020-02-04 DIAGNOSIS — I480 Paroxysmal atrial fibrillation: Secondary | ICD-10-CM | POA: Diagnosis not present

## 2020-02-04 DIAGNOSIS — E1169 Type 2 diabetes mellitus with other specified complication: Secondary | ICD-10-CM | POA: Diagnosis not present

## 2020-02-04 DIAGNOSIS — Z79899 Other long term (current) drug therapy: Secondary | ICD-10-CM | POA: Diagnosis not present

## 2020-02-04 DIAGNOSIS — E785 Hyperlipidemia, unspecified: Secondary | ICD-10-CM | POA: Diagnosis not present

## 2020-02-17 DIAGNOSIS — H524 Presbyopia: Secondary | ICD-10-CM | POA: Diagnosis not present

## 2020-03-10 DIAGNOSIS — N760 Acute vaginitis: Secondary | ICD-10-CM | POA: Diagnosis not present

## 2020-03-10 DIAGNOSIS — N952 Postmenopausal atrophic vaginitis: Secondary | ICD-10-CM | POA: Diagnosis not present

## 2020-03-10 DIAGNOSIS — N3946 Mixed incontinence: Secondary | ICD-10-CM | POA: Diagnosis not present

## 2020-03-10 DIAGNOSIS — B9689 Other specified bacterial agents as the cause of diseases classified elsewhere: Secondary | ICD-10-CM | POA: Diagnosis not present

## 2020-03-10 DIAGNOSIS — N390 Urinary tract infection, site not specified: Secondary | ICD-10-CM | POA: Diagnosis not present

## 2020-03-22 ENCOUNTER — Telehealth: Payer: Self-pay | Admitting: Internal Medicine

## 2020-03-23 NOTE — Telephone Encounter (Signed)
Called pt to inform her that her medication has already been sent to her preferred pharmacy. I advised pt that if she has any other problems, questions or concerns, to give our office a call back. Pt verbalized understanding.

## 2020-03-23 NOTE — Telephone Encounter (Signed)
Pt's age 86, wt 81.4 kg, SCr 1.24, CrCl 44.17, last ov w/ SK 10/01/19.

## 2020-03-23 NOTE — Telephone Encounter (Signed)
Pt called in and stated she completely out of this med.    Best number [phone]

## 2020-03-24 DIAGNOSIS — L570 Actinic keratosis: Secondary | ICD-10-CM | POA: Diagnosis not present

## 2020-04-07 DIAGNOSIS — R051 Acute cough: Secondary | ICD-10-CM | POA: Diagnosis not present

## 2020-04-07 DIAGNOSIS — J0100 Acute maxillary sinusitis, unspecified: Secondary | ICD-10-CM | POA: Diagnosis not present

## 2020-04-07 DIAGNOSIS — J029 Acute pharyngitis, unspecified: Secondary | ICD-10-CM | POA: Diagnosis not present

## 2020-04-07 DIAGNOSIS — Z20828 Contact with and (suspected) exposure to other viral communicable diseases: Secondary | ICD-10-CM | POA: Diagnosis not present

## 2020-06-22 DIAGNOSIS — N952 Postmenopausal atrophic vaginitis: Secondary | ICD-10-CM | POA: Diagnosis not present

## 2020-06-22 DIAGNOSIS — N390 Urinary tract infection, site not specified: Secondary | ICD-10-CM | POA: Diagnosis not present

## 2020-06-22 DIAGNOSIS — N3946 Mixed incontinence: Secondary | ICD-10-CM | POA: Diagnosis not present

## 2020-06-22 DIAGNOSIS — B373 Candidiasis of vulva and vagina: Secondary | ICD-10-CM | POA: Diagnosis not present

## 2020-07-15 ENCOUNTER — Other Ambulatory Visit: Payer: Self-pay

## 2020-07-15 DIAGNOSIS — I6521 Occlusion and stenosis of right carotid artery: Secondary | ICD-10-CM

## 2020-08-04 DIAGNOSIS — I872 Venous insufficiency (chronic) (peripheral): Secondary | ICD-10-CM | POA: Diagnosis not present

## 2020-08-04 DIAGNOSIS — E785 Hyperlipidemia, unspecified: Secondary | ICD-10-CM | POA: Diagnosis not present

## 2020-08-04 DIAGNOSIS — Z79899 Other long term (current) drug therapy: Secondary | ICD-10-CM | POA: Diagnosis not present

## 2020-08-04 DIAGNOSIS — E1169 Type 2 diabetes mellitus with other specified complication: Secondary | ICD-10-CM | POA: Diagnosis not present

## 2020-08-04 DIAGNOSIS — I480 Paroxysmal atrial fibrillation: Secondary | ICD-10-CM | POA: Diagnosis not present

## 2020-08-04 DIAGNOSIS — M810 Age-related osteoporosis without current pathological fracture: Secondary | ICD-10-CM | POA: Diagnosis not present

## 2020-09-15 ENCOUNTER — Telehealth: Payer: Self-pay | Admitting: Oncology

## 2020-09-15 NOTE — Telephone Encounter (Signed)
09/15/20 poke with patient and scheduled mammmogram on 11/09/20'@1pm'$ -[place]

## 2020-10-01 NOTE — Progress Notes (Signed)
Patient Care Team: Nicoletta Dress, MD as PCP - General (Internal Medicine) Deboraha Sprang, MD as Attending Physician (Cardiology)   HPI  Colleen Galloway is a 86 y.o. female Seen in followup for atrial fibrillation-permanent for which she is anticoagulated w Rivaroxaban  She had problems with edema on calcium channel blockers and has been managed with beta blockers.  She was diagnosed with breast cancer and underwent partial mastectomy, chemotherapy and radiation therapy. She has now completed treatment.      Today, her main concern is having dizzy spells that began this week, and occur after she stands.  Earlier this week she woke up one morning and felt very dizzy upon standing. The next morning she felt mildly dizzy and "a little funny in her head." She then took Meclizine. For the rest of the week, she has been feeling "staggery, like a drunk." At this visit, she is feeling better but not at baseline. So far she has not felt dizziness today, and notes she walked from the parking lot without staggering.  During her dizzy spells, her blood pressure was ranging from [id number] systolic, and her machine registered irregular heartbeats. Her oxygen saturation was below 95 during one of the spells. At home she had stopped checking her blood pressure prior to the dizzy spells because she was feeling better.  She takes her Lasix as needed.  The patient denies chest pain, shortness of breath, nocturnal dyspnea, orthopnea or peripheral edema.  There have been no palpitations, or syncope.      Date Cr Hgb K  6/18 0.76 13.5   9/19   0.99 14.7   6/22 1.08 12.9 4.3     DATE TEST EF   3/16 Echo   55-60 % PA sys 38  10/18 Echo   55 %  PA Sys 50          Past Medical History:  Diagnosis Date   Allergy    Arthritis    Breast cancer ([place]) 09/17/13   Left iNVASIVE DUCTAL,dcis   Calculus of kidney    kidney stones   Carotid artery disease ([place])    Followed by Dr. Scot Dock   CHF  (congestive heart failure) (HCC)    Esophageal reflux    Insomnia, unspecified    Lumbosacral spondylosis without myelopathy    Orthostatic lightheadedness 10/20/2011   Osteoarthrosis, unspecified whether generalized or localized, unspecified site    Persistent atrial fibrillation (HCC)    PONV (postoperative nausea and vomiting)     Past Surgical History:  Procedure Laterality Date   BREAST LUMPECTOMY WITH NEEDLE LOCALIZATION AND AXILLARY SENTINEL LYMPH NODE BX Left 10/18/2013   Procedure: LEFT AXILLARY LYMPHATIC MAPPING; INJECTION OF METHYLENE BLUE INTO LEFT BREAST; LEFT BREAST PARTIAL MASTECTOMY AFTER NEEDLE LOCALIZATION; AXILLARY SENTINEL LYMPH NODE BIOPSY;  Surgeon: Jackolyn Confer, MD;  Location: [facility];  Service: General;  Laterality: Left;   DILATION AND CURETTAGE OF UTERUS  1960   KIDNEY STONE SURGERY     PORT-A-CATH REMOVAL Right 12/26/2014   Procedure: REMOVAL PORT-A-CATH;  Surgeon: Jackolyn Confer, MD;  Location: [facility];  Service: General;  Laterality: Right;   PORTACATH PLACEMENT N/A 10/18/2013   Procedure: INSERTION PORT-A-CATH/ULTRASOUND GUIDED,RIGHT INTERNAL JUGULAR;  Surgeon: Jackolyn Confer, MD;  Location: [facility];  Service: General;  Laterality: N/A;   varicose veins      Current Outpatient Medications  Medication Sig Dispense Refill   calcium citrate-vitamin D (CITRACAL+D) 315-200 MG-UNIT per tablet Take  1 tablet by mouth daily.      doxycycline (VIBRAMYCIN) 50 MG capsule Take 50 mg by mouth daily as needed.     fenofibrate 160 MG tablet Take 160 mg by mouth daily after supper.      furosemide (LASIX) 20 MG tablet Take 1 tablet (20 mg total) by mouth daily. 90 tablet 3   meclizine (ANTIVERT) 25 MG tablet Take 1 tablet (25 mg total) by mouth 3 (three) times daily as needed for dizziness. 90 tablet 2   metoprolol succinate (TOPROL-XL) 50 MG 24 hr tablet Take 1 tablet (50 mg total) by mouth 2 (two) times daily. 180 tablet 1   Multiple Vitamin (MULTIVITAMIN)  capsule Take 1 capsule by mouth daily.       omeprazole (PRILOSEC) 20 MG capsule Take 20 mg by mouth daily.       vitamin B-12 (CYANOCOBALAMIN) 1000 MCG tablet Take 1,000 mcg by mouth daily.     XARELTO 20 MG TABS tablet TAKE 1 TABLET(20 MG) BY MOUTH DAILY WITH SUPPER 90 tablet 3   Omega-3 Fatty Acids (EQL OMEGA 3 FISH OIL) 1400 MG CAPS Take 2-4 capsules by mouth See admin instructions. 2-4 capsules 1-2 times daily depending on what patient wants to do (Patient not taking: Reported on 10/02/2020)     No current facility-administered medications for this visit.    Allergies  Allergen Reactions   Flecainide Shortness Of Breath, Swelling and Other (See Comments)    Sob, visual problems, swelling ankles   Nebivolol Shortness Of Breath and Swelling    Sob, visual problems, swelling in ankles   Diltiazem Hcl Swelling    Pt states it causes her ankle swelling    Lopressor [Metoprolol Tartrate]     Severe dizziness. Irregular heartbeat    Sodium Pantothenate Nausea And Vomiting    Review of Systems negative except from HPI and PMH  Physical Exam BP (!) 154/82   Pulse 69   Ht '5\' 4"'$  (1.626 m)   Wt 173 lb (78.5 kg)   SpO2 90%   BMI 29.70 kg/m  Well developed and nourished in no acute distress HENT normal Neck supple with JVP-  flat  Clear Regular rate and rhythm, no murmurs or gallops Abd-soft with active BS No Clubbing cyanosis edema Skin-warm and dry A & Oriented  Grossly normal sensory and motor function  ECG sinus at 69 Intervals 19/07/39   Assessment and  Plan  Atrial fibrillation--flutter-persistent rapid ventricular response  Hypertension  HFpEF chronic    Breast Cancer-therapy     Pulmonary hypertension    Some interval irregular heartbeat.  Associated dizziness.  Might be atrial fibrillation.  Have asked her to consider the cardia monitor.  Interestingly, some of it improved with meclizine.  We will refill this.  Blood pressure may be elevated.  Recordings  are 130-150.  She will check it again once her symptoms have abated.  Right now she is on metoprolol which we will continue at 75 mg a day.  Recommend alternative antihypertensive therapy if augmented therapy is necessary.  Euvolemic.  We will continue her furosemide 20 mg as needed  No bleeding on her Xarelto.  We will continue 20 mg a day.    I,Mathew Stumpf,acting as a scribe for Virl Axe, MD.,have documented all relevant documentation on the behalf of Virl Axe, MD,as directed by  Virl Axe, MD while in the presence of Virl Axe, MD.   I, Virl Axe, MD, have reviewed all documentation for this visit.  The documentation on 10/02/20 for the exam, diagnosis, procedures, and orders are all accurate and complete.   Marland Kitchen

## 2020-10-02 ENCOUNTER — Ambulatory Visit: Payer: PPO | Admitting: Internal Medicine

## 2020-10-02 ENCOUNTER — Encounter: Payer: Self-pay | Admitting: Internal Medicine

## 2020-10-02 ENCOUNTER — Other Ambulatory Visit: Payer: Self-pay

## 2020-10-02 VITALS — BP 154/82 | HR 69 | Ht 64.0 in | Wt 173.0 lb

## 2020-10-02 DIAGNOSIS — I480 Paroxysmal atrial fibrillation: Secondary | ICD-10-CM

## 2020-10-02 DIAGNOSIS — I5032 Chronic diastolic (congestive) heart failure: Secondary | ICD-10-CM | POA: Diagnosis not present

## 2020-10-02 MED ORDER — MECLIZINE HCL 25 MG PO TABS: 25.0000 mg | ORAL_TABLET | Freq: Three times a day (TID) | ORAL | 2 refills | Status: AC | PRN

## 2020-10-02 NOTE — Patient Instructions (Signed)

## 2020-10-15 DIAGNOSIS — S46911A Strain of unspecified muscle, fascia and tendon at shoulder and upper arm level, right arm, initial encounter: Secondary | ICD-10-CM | POA: Diagnosis not present

## 2020-10-22 DIAGNOSIS — M25511 Pain in right shoulder: Secondary | ICD-10-CM | POA: Diagnosis not present

## 2020-10-26 DIAGNOSIS — N952 Postmenopausal atrophic vaginitis: Secondary | ICD-10-CM | POA: Diagnosis not present

## 2020-10-26 DIAGNOSIS — N390 Urinary tract infection, site not specified: Secondary | ICD-10-CM | POA: Diagnosis not present

## 2020-10-26 DIAGNOSIS — B373 Candidiasis of vulva and vagina: Secondary | ICD-10-CM | POA: Diagnosis not present

## 2020-10-26 DIAGNOSIS — N3946 Mixed incontinence: Secondary | ICD-10-CM | POA: Diagnosis not present

## 2020-10-28 ENCOUNTER — Other Ambulatory Visit: Payer: Self-pay

## 2020-10-28 ENCOUNTER — Ambulatory Visit: Payer: PPO | Admitting: Physician Assistant

## 2020-10-28 ENCOUNTER — Ambulatory Visit (HOSPITAL_COMMUNITY)
Admission: RE | Admit: 2020-10-28 | Discharge: 2020-10-28 | Disposition: A | Discharge status: Home / Self Care | Payer: PPO | Source: Ambulatory Visit | Attending: Vascular Surgery | Admitting: Vascular Surgery

## 2020-10-28 VITALS — BP 148/82 | HR 80 | Temp 98.0°F | Resp 16 | Ht 64.0 in | Wt 169.0 lb

## 2020-10-28 DIAGNOSIS — I6521 Occlusion and stenosis of right carotid artery: Secondary | ICD-10-CM | POA: Insufficient documentation

## 2020-10-28 NOTE — Progress Notes (Signed)
Carotid Artery Follow-Up   VASCULAR SURGERY ASSESSMENT & PLAN:   Colleen Galloway is a 86 y.o. female who Dr. Scot Dock has been following with a moderate right carotid stenosis.  She comes in for routine follow-up visit.   Bilateral carotid artery stenosis: Hemodynamically significant plaque >50% visualized in the right CCA. The patient has no symptoms referable to carotid artery stenosis.  Duplex examination today is stable as compared to 1 year ago.  We reviewed the signs and symptoms of stroke/TIA and advised the patient to call EMS should these occur.    Continue optimal medical management of  hypertension and follow-up with primary care physician. Nonsmoker. Continue the following medications: fenofibrate. On rivaroxaban for pAF Follow-up in 1 year with carotid duplex ultrasound.  SUBJECTIVE:   The patient denies monocular blindness, slurred speech, facial drooping, extremity weakness or numbness.  PHYSICAL EXAM:   Vitals:   10/28/20 1327  BP: (!) 148/82  Pulse: 80  Resp: 16  Temp: 98 F (36.7 C)  TempSrc: Temporal  SpO2: 98%  Weight: 169 lb (76.7 kg)  Height: '5\' 4"'$  (1.626 m)    General appearance: Well-developed, well-nourished in no apparent distress Neurologic: Alert and oriented x4, tongue is midline, face symmetric, speech fluent, walks unaided without ataxia Cardiovascular: Heart rate and rhythm are regular.  Pedal pulses are palpable.  Right carotid bruit present. Respirations: Nonlabored Abdomen: No palpable pulsatile mass   NON-INVASIVE VASCULAR STUDIES  10/28/2020 Summary:  Right Carotid: Velocities in the right ICA are consistent with a 1-39%  stenosis. Hemodynamically significant plaque >50% visualized in the CCA.   Left Carotid: Velocities in the left ICA are consistent with a 1-39% stenosis.   Vertebrals:  Bilateral vertebral arteries demonstrate antegrade flow.  Subclavians: Normal flow hemodynamics were seen in bilateral subclavian arteries.    *See table(s) above for measurements and observations.     Preliminary      PROBLEM LIST:    The patient's past medical history, past surgical history, family history, social history, allergy list and medication list are reviewed.   CURRENT MEDS:    Current Outpatient Medications:    calcium citrate-vitamin D (CITRACAL+D) 315-200 MG-UNIT per tablet, Take 1 tablet by mouth daily. , Disp: , Rfl:    doxycycline (VIBRAMYCIN) 50 MG capsule, Take 50 mg by mouth daily as needed., Disp: , Rfl:    fenofibrate 160 MG tablet, Take 160 mg by mouth daily after supper. , Disp: , Rfl:    furosemide (LASIX) 20 MG tablet, Take 1 tablet (20 mg total) by mouth daily., Disp: 90 tablet, Rfl: 3   meclizine (ANTIVERT) 25 MG tablet, Take 1 tablet (25 mg total) by mouth 3 (three) times daily as needed for dizziness., Disp: 90 tablet, Rfl: 2   metoprolol succinate (TOPROL-XL) 50 MG 24 hr tablet, Take 1 tablet (50 mg total) by mouth 2 (two) times daily., Disp: 180 tablet, Rfl: 1   Multiple Vitamin (MULTIVITAMIN) capsule, Take 1 capsule by mouth daily.  , Disp: , Rfl:    Omega-3 Fatty Acids (EQL OMEGA 3 FISH OIL) 1400 MG CAPS, Take 2-4 capsules by mouth See admin instructions. 2-4 capsules 1-2 times daily depending on what patient wants to do, Disp: , Rfl:    omeprazole (PRILOSEC) 20 MG capsule, Take 20 mg by mouth daily.  , Disp: , Rfl:    vitamin B-12 (CYANOCOBALAMIN) 1000 MCG tablet, Take 1,000 mcg by mouth daily., Disp: , Rfl:    XARELTO 20 MG TABS tablet, TAKE 1  TABLET(20 MG) BY MOUTH DAILY WITH SUPPER, Disp: 90 tablet, Rfl: 3   REVIEW OF SYSTEMS:   '[X]'$  denotes positive finding, '[ ]'$  denotes negative finding Cardiac  Comments:  Chest pain or chest pressure:    Shortness of breath upon exertion:    Short of breath when lying flat:    Irregular heart rhythm:        Vascular    Pain in calf, thigh, or hip brought on by ambulation:    Pain in feet at night that wakes you up from your sleep:     Blood  clot in your veins:    Leg swelling:         Pulmonary    Oxygen at home:    Productive cough:     Wheezing:         Neurologic    Sudden weakness in arms or legs:     Sudden numbness in arms or legs:     Sudden onset of difficulty speaking or slurred speech:    Temporary loss of vision in one eye:     Problems with dizziness:         Gastrointestinal    Blood in stool:     Vomited blood:         Genitourinary    Burning when urinating:     Blood in urine:        Psychiatric    Major depression:         Hematologic    Bleeding problems:    Problems with blood clotting too easily:        Skin    Rashes or ulcers:        Constitutional    Fever or chills:     Barbie Banner, PA-C  Office: [phone] 10/28/2020  Dr. Donzetta Matters

## 2020-11-05 NOTE — Progress Notes (Signed)
[name]  [address] [place],  [place]  [address] [phone]  Clinic Day:  11/12/2020  Referring physician: Nicoletta Dress, MD  This document serves as a record of services personally performed by Hosie Poisson, MD. It was created on their behalf by [facility] E, a trained medical scribe. The creation of this record is based on the scribe's personal observations and the provider's statements to them.  CHIEF COMPLAINT:  CC: History of stage IIA hormone receptor and HER2 positive left breast cancer  Current Treatment:  Surveillance   HISTORY OF PRESENT ILLNESS:  Colleen Galloway is a 86 y.o. female with a history of stage IIA (T2 N0 M0) hormone and HER 2 Neu receptor positive left breast cancer diagnosed in August 2015.  She was treated with lumpectomy.  Pathology revealed a 2.4 cm, grade 3, invasive ductal carcinoma with negative sentinel node.  Estrogen and progesterone receptors were positive and HER 2 positive.  Ki 67 was 78%.  She was given adjuvant chemotherapy with weekly Abraxane and trastuzumab for 12 weeks, which was completed at the end of December 2015.  She was referred to [place] in January 2016 to complete her trastuzumab, which finished in October 2016.  She received adjuvant radiation to the left breast, completed in March 2016.  She was placed on hormonal therapy with anastrozole 1 mg daily in April 2016. She has had some mild elevation of the liver transaminases.  She had osteoporosis on bone density scan in January 2016, with a T-score of -2.8 in the femur and a T-score of -2.3 in the forearm.  She was placed on Prolia in May 2016, but then declined further Prolia, as she felt she had too many side effects.  We recommended treatment for the osteoporosis, as anastrozole has the potential to worsen this. She continues calcium and vitamin-D twice daily.  She has occasional episodes of atrial fibrillation and was found to be in atrial  fibrillation in early October and saw Dr. Caryl Comes, her cardiologist.  She was found to have pulmonary artery hypertension. She complains of a pinched nerve in her lumbar spine and slight scoliosis, and rates her back and hip pain at a 6/10. She saw Dr. Lyda Jester and had upper endoscopy, where she was found to have a polyp of her stomach which was removed.  Dr. Delena Bali does her labs on a regular basis.  She also had a bone density scan done in January of 2018, and this showed osteoporosis but the spine had improved by 2.5% and the femur by 3%.  Her T-score of the femur was still -2.5.  She has had her shingles vaccine. She completed 5 years of anastrozole in May 2021. Bone density scan from April 2021 revealed osteoporosis with a T-score of -3.1 of the left forearm radius, previously -2.5.  The right femur neck measures -2.7, previously -2.5, and the dual femur total mean is stable at -2.2.   INTERVAL HISTORY:  Colleen Galloway is here for annual follow up and states that she has been well. Annual screening bilateral mammogram from September 26th revealed a developing 1.3 cm density in the left breast, which is indeterminate. She is scheduled for diagnostic left mammogram and ultrasound on October 6th. She does note heaviness of the right breast and asymmetry in size. I gave her a brochure of [facility] [employer] [facility] to be fitted for a partial breast prosthesis and mastectomy bras. She notes intermittent UTI throughout the year. She has been seen  by Clare Gandy and was prescribed medication with improvement. She undergoes routine blood work with Dr. Delena Bali, and lab work from June was unremarkable.  Her  appetite is good, and she has lost 8 and 1/2 pounds since her last visit.  She denies fever, chills or other signs of infection.  She denies nausea, vomiting, bowel issues, or abdominal pain.  She denies sore throat, cough, dyspnea, or chest pain.  REVIEW OF SYSTEMS:  Review of Systems  Constitutional: Negative.  Negative for  appetite change, chills, fatigue, fever and unexpected weight change.  HENT:  Negative.    Eyes: Negative.   Respiratory: Negative.  Negative for chest tightness, cough, hemoptysis, shortness of breath and wheezing.   Cardiovascular: Negative.  Negative for chest pain, leg swelling and palpitations.  Gastrointestinal: Negative.  Negative for abdominal distention, abdominal pain, blood in stool, constipation, diarrhea, nausea and vomiting.  Endocrine: Negative.   Genitourinary:  Negative for difficulty urinating, dysuria, frequency and hematuria.        Intermittent UTI throughout the year  Musculoskeletal:  Negative for arthralgias, back pain, flank pain, gait problem and myalgias.       Heaviness of the right breast and asymmetry  Skin: Negative.   Neurological: Negative.  Negative for dizziness, extremity weakness, gait problem, headaches, light-headedness, numbness, seizures and speech difficulty.  Hematological: Negative.   Psychiatric/Behavioral: Negative.  Negative for depression and sleep disturbance. The patient is not nervous/anxious.     VITALS:  Blood pressure 140/78, pulse 70, temperature 98.3 F (36.8 C), temperature source Oral, resp. rate 18, height 5' 4" (1.626 m), weight 171 lb 9.6 oz (77.8 kg), SpO2 96 %.  Wt Readings from Last 3 Encounters:  11/12/20 171 lb 9.6 oz (77.8 kg)  10/28/20 169 lb (76.7 kg)  10/02/20 173 lb (78.5 kg)    Body mass index is 29.46 kg/m.  Performance status (ECOG): 1 - Symptomatic but completely ambulatory  PHYSICAL EXAM:  Physical Exam Constitutional:      General: She is not in acute distress.    Appearance: Normal appearance. She is normal weight.  HENT:     Head: Normocephalic and atraumatic.  Eyes:     General: No scleral icterus.    Extraocular Movements: Extraocular movements intact.     Conjunctiva/sclera: Conjunctivae normal.     Pupils: Pupils are equal, round, and reactive to light.  Cardiovascular:     Rate and Rhythm:  Normal rate and regular rhythm.     Pulses: Normal pulses.     Heart sounds: Normal heart sounds. No murmur heard.   No friction rub. No gallop.  Pulmonary:     Effort: Pulmonary effort is normal. No respiratory distress.     Breath sounds: Normal breath sounds.  Chest:     Comments: Large firm scar in the upper left breast. There is a 3 x 2 cm seroma. Both breasts are without masses. Abdominal:     General: Bowel sounds are normal. There is no distension.     Palpations: Abdomen is soft. There is no hepatomegaly, splenomegaly or mass.     Tenderness: There is no abdominal tenderness.  Musculoskeletal:        General: Normal range of motion.     Cervical back: Normal range of motion and neck supple.     Right lower leg: No edema.     Left lower leg: No edema.  Lymphadenopathy:     Cervical: No cervical adenopathy.  Skin:    General: Skin  is warm and dry.  Neurological:     General: No focal deficit present.     Mental Status: She is alert and oriented to person, place, and time. Mental status is at baseline.  Psychiatric:        Mood and Affect: Mood normal.        Behavior: Behavior normal.        Thought Content: Thought content normal.        Judgment: Judgment normal.    LABS:   CBC Latest Ref Rng & Units 03/14/2017 12/24/2014 02/12/2014  WBC 3.4 - 10.8 x10E3/uL 8.5 3.9(L) 5.5  Hemoglobin 11.1 - 15.9 g/dL 14.7 12.6 10.3(L)  Hematocrit 34.0 - 46.6 % 43.3 38.0 32.2(L)  Platelets 150 - 379 x10E3/uL 285 165 225   CMP Latest Ref Rng & Units 10/31/2017 03/14/2017 12/24/2014  Glucose 65 - 99 mg/dL 125(H) 95 96  BUN 8 - 27 mg/dL _0 Creatinine 0.57 - 1.00 mg/dL 0.99 0.95 0.80  Sodium 134 - 144 mmol/L 142 141 140  Potassium 3.5 - 5.2 mmol/L 3.7 5.0 4.8  Chloride 96 - 106 mmol/L 104 101 105  CO2 20 - 29 mmol/L _1 Calcium 8.7 - 10.3 mg/dL 9.8 10.2 9.6  Total Protein 6.4 - 8.3 g/dL - - -  Total Bilirubin 0.20 - 1.20 mg/dL - - -  Alkaline Phos 40 - 150 U/L - - -   AST 5 - 34 U/L - - -  ALT 0 - 55 U/L - - -    STUDIES:  VAS US CAROTID  Result Date: 10/28/2020 Carotid Arterial Duplex Study Patient Name:  SERAI TUKES  Date of Exam:   10/28/2020 Medical Rec #: [id number]       Accession #:    [id number] Date of Birth: 1934/12/06       Patient Gender: F Patient Age:   43 years Exam Location:  [facility] Procedure:      VAS US CAROTID Referring Phys: Harrell Gave DICKSON --------------------------------------------------------------------------------  Indications:       Carotid artery disease. Risk Factors:      Hyperlipidemia. Other Factors:     CHF. Comparison Study:  10/23/2019                    R= >50% CCA, 1-39% ICA                    L= 1-39% Performing Technologist: Ronal Fear RVS, RCS  Examination Guidelines: A complete evaluation includes B-mode imaging, spectral Doppler, color Doppler, and power Doppler as needed of all accessible portions of each vessel. Bilateral testing is considered an integral part of a complete examination. Limited examinations for reoccurring indications may be performed as noted.  Right Carotid Findings: +----------+--------+--------+--------+--------------------------+--------+           PSV cm/sEDV cm/sStenosisPlaque Description        Comments +----------+--------+--------+--------+--------------------------+--------+ CCA Prox  77      14                                                 +----------+--------+--------+--------+--------------------------+--------+ CCA Mid   99      24                                                 +----------+--------+--------+--------+--------------------------+--------+  CCA Distal269     61      >50%    heterogenous and irregular         +----------+--------+--------+--------+--------------------------+--------+ ICA Prox  101     8       1-39%   heterogenous and irregular          +----------+--------+--------+--------+--------------------------+--------+ ICA Mid   139     35      1-39%                                      +----------+--------+--------+--------+--------------------------+--------+ ICA Distal88      28                                                 +----------+--------+--------+--------+--------------------------+--------+ ECA       137     22                                                 +----------+--------+--------+--------+--------------------------+--------+ +----------+--------+-------+----------------+-------------------+           PSV cm/sEDV cmsDescribe        Arm Pressure (mmHG) +----------+--------+-------+----------------+-------------------+ [id number]            Multiphasic, WNL                    +----------+--------+-------+----------------+-------------------+ +---------+--------+--+--------+-+---------+ VertebralPSV cm/s34EDV cm/s8Antegrade +---------+--------+--+--------+-+---------+ distal CCA velocity PRE stenosis=67/16 cm/s Left Carotid Findings: +----------+--------+--------+--------+------------------+------------------+           PSV cm/sEDV cm/sStenosisPlaque DescriptionComments           +----------+--------+--------+--------+------------------+------------------+ CCA Prox  102     20                                                   +----------+--------+--------+--------+------------------+------------------+ CCA Mid   98      28                                intimal thickening +----------+--------+--------+--------+------------------+------------------+ CCA Distal95      27              heterogenous                         +----------+--------+--------+--------+------------------+------------------+ ICA Prox  57      15      1-39%   heterogenous                         +----------+--------+--------+--------+------------------+------------------+ ICA Mid   100      29      1-39%                                        +----------+--------+--------+--------+------------------+------------------+ ICA Distal97      32                                                   +----------+--------+--------+--------+------------------+------------------+  ECA       75      15                                                   +----------+--------+--------+--------+------------------+------------------+ +----------+--------+--------+----------------+-------------------+           PSV cm/sEDV cm/sDescribe        Arm Pressure (mmHG) +----------+--------+--------+----------------+-------------------+ [id number]             Multiphasic, WNL                    +----------+--------+--------+----------------+-------------------+ +---------+--------+--+--------+--+---------+ VertebralPSV cm/s63EDV cm/s18Antegrade +---------+--------+--+--------+--+---------+   Summary: Right Carotid: Velocities in the right ICA are consistent with a 1-39% stenosis.                 Hemodynamically significant plaque >50% visualized in the CCA. Left Carotid: Velocities in the left ICA are consistent with a 1-39% stenosis. Vertebrals:  Bilateral vertebral arteries demonstrate antegrade flow. Subclavians: Normal flow hemodynamics were seen in bilateral subclavian              arteries. *See table(s) above for measurements and observations.  Electronically signed by Servando Snare MD on 10/28/2020 at 3:53:01 PM.    Final      DIGITAL SCREENING BILATERAL MAMMOGRAM: 11/09/2020 Breast Composition Category C IMPRESSION: The developing 1.3 cm density in the left breast is indeterminate. A diagnostic mammogram and ultrasound is recommended.  HISTORY:   Allergies:  Allergies  Allergen Reactions   Flecainide Shortness Of Breath, Swelling and Other (See Comments)    Sob, visual problems, swelling ankles   Nebivolol Shortness Of Breath and Swelling    Sob, visual problems, swelling  in ankles   Diltiazem Hcl Swelling    Pt states it causes her ankle swelling    Lopressor [Metoprolol Tartrate]     Severe dizziness. Irregular heartbeat    Sodium Pantothenate Nausea And Vomiting    Current Medications: Current Outpatient Medications  Medication Sig Dispense Refill   calcium citrate-vitamin D (CITRACAL+D) 315-200 MG-UNIT per tablet Take 1 tablet by mouth daily.      conjugated estrogens (PREMARIN) vaginal cream Place 1 applicator vaginally at bedtime. 3 times weekly only     doxycycline (VIBRAMYCIN) 50 MG capsule Take 50 mg by mouth daily as needed.     fenofibrate 160 MG tablet Take 160 mg by mouth daily after supper.      furosemide (LASIX) 20 MG tablet Take 1 tablet (20 mg total) by mouth daily. 90 tablet 3   meclizine (ANTIVERT) 25 MG tablet Take 1 tablet (25 mg total) by mouth 3 (three) times daily as needed for dizziness. 90 tablet 2   Methenamine-Sodium Salicylate (AZO URINARY TRACT DEFENSE PO) Take by mouth.     metoprolol succinate (TOPROL-XL) 25 MG 24 hr tablet Take 75 mg by mouth daily.     Multiple Vitamin (MULTIVITAMIN) capsule Take 1 capsule by mouth daily.       nitrofurantoin, macrocrystal-monohydrate, (MACROBID) 100 MG capsule Take by mouth.     omeprazole (PRILOSEC) 20 MG capsule Take 20 mg by mouth daily.       XARELTO 20 MG TABS tablet TAKE 1 TABLET(20 MG) BY MOUTH DAILY WITH SUPPER 90 tablet 3   No current facility-administered medications for this visit.     ASSESSMENT & PLAN:   Assessment/Plan:  History of stage IIA left breast cancer diagnosed in August 2015, over 7 years ago which was hormone positive and HER 2 positive.  She was treated with surgery, chemotherapy, HER 2 directed therapy, adjuvant radiation and hormonal therapy.  She completed 5 years of anastrozole in May 2021.   Osteoporosis. Her bone density scan shows some worsening but she is now off the hormonal therapy.  She will be due for repeat bone density in April 2023, which  we will schedule.  Indeterminate 1.3 cm density in the left breast. We will obtain diagnostic mammogram and ultrasound for further evaluation.  We will order diagnostic mammogram and ultrasound of the left breast for further evaluation of the 1.3 cm density. If this turns out to be abnormal we will arrange for biopsy and surgical referral. Bone density will be schedule for April. If all is well, I can see her back in 1 year with bilateral mammogram for reexamination. She understands and agrees with this plan of care.   I provided 20 minutes of face-to-face time during this this encounter and > 50% was spent counseling as documented under my assessment and plan.    Derwood Kaplan, MD [facility] AT [facility] [address] [place] [address] Dept: [phone] Dept Fax: [phone]   I, Rita Ohara, am acting as scribe for Derwood Kaplan, MD  I have reviewed this report as typed by the medical scribe, and it is complete and accurate.  Hermina Barters

## 2020-11-09 DIAGNOSIS — Z1231 Encounter for screening mammogram for malignant neoplasm of breast: Secondary | ICD-10-CM | POA: Diagnosis not present

## 2020-11-12 ENCOUNTER — Encounter: Payer: Self-pay | Admitting: Oncology

## 2020-11-12 ENCOUNTER — Inpatient Hospital Stay: Payer: PPO | Attending: Oncology | Admitting: Oncology

## 2020-11-12 ENCOUNTER — Other Ambulatory Visit: Payer: Self-pay

## 2020-11-12 ENCOUNTER — Other Ambulatory Visit: Payer: Self-pay | Admitting: Oncology

## 2020-11-12 VITALS — BP 140/78 | HR 70 | Temp 98.3°F | Resp 18 | Ht 64.0 in | Wt 171.6 lb

## 2020-11-12 DIAGNOSIS — C50512 Malignant neoplasm of lower-outer quadrant of left female breast: Secondary | ICD-10-CM

## 2020-11-12 DIAGNOSIS — Z170 Estrogen receptor positive status [ER+]: Secondary | ICD-10-CM

## 2020-11-12 DIAGNOSIS — M810 Age-related osteoporosis without current pathological fracture: Secondary | ICD-10-CM

## 2020-11-13 ENCOUNTER — Telehealth: Payer: Self-pay | Admitting: Oncology

## 2020-11-13 NOTE — Telephone Encounter (Signed)
Per 9/29 LOS, patient scheduled for Sept 2023 Appt's.  Gave patient Appt Summary

## 2020-11-18 ENCOUNTER — Encounter: Payer: Self-pay | Admitting: Oncology

## 2020-11-19 ENCOUNTER — Telehealth: Payer: Self-pay

## 2020-11-19 DIAGNOSIS — R928 Other abnormal and inconclusive findings on diagnostic imaging of breast: Secondary | ICD-10-CM | POA: Diagnosis not present

## 2020-11-19 DIAGNOSIS — R922 Inconclusive mammogram: Secondary | ICD-10-CM | POA: Diagnosis not present

## 2020-11-19 DIAGNOSIS — R921 Mammographic calcification found on diagnostic imaging of breast: Secondary | ICD-10-CM | POA: Diagnosis not present

## 2020-11-19 NOTE — Telephone Encounter (Signed)
-----   Message from Derwood Kaplan, MD sent at 11/18/2020  6:20 PM EDT ----- Regarding: mammo/US She is sched for diagnostic mammo and U/S at [facility], make sure we get report

## 2020-11-19 NOTE — Telephone Encounter (Signed)
[facility] will fax Mammo and [place] results once finalized.

## 2020-11-27 ENCOUNTER — Other Ambulatory Visit: Payer: Self-pay

## 2020-11-27 DIAGNOSIS — N641 Fat necrosis of breast: Secondary | ICD-10-CM | POA: Diagnosis not present

## 2020-11-27 DIAGNOSIS — N6321 Unspecified lump in the left breast, upper outer quadrant: Secondary | ICD-10-CM | POA: Diagnosis not present

## 2020-12-07 ENCOUNTER — Telehealth: Payer: Self-pay

## 2020-12-07 NOTE — Telephone Encounter (Signed)
-----   Message from Belva Chimes, LPN sent at 71/25/2712  1:05 PM EDT ----- Regarding: FW: call  ----- Message ----- From: Derwood Kaplan, MD Sent: 12/03/2020   7:25 PM EDT To: Belva Chimes, LPN Subject: call                                           That is wonderful news about the benign bx, I can see her next year as planned

## 2020-12-07 NOTE — Telephone Encounter (Signed)
Patient notified has appt scheduled.

## 2020-12-19 DIAGNOSIS — R0981 Nasal congestion: Secondary | ICD-10-CM | POA: Diagnosis not present

## 2020-12-19 DIAGNOSIS — R519 Headache, unspecified: Secondary | ICD-10-CM | POA: Diagnosis not present

## 2020-12-19 DIAGNOSIS — Z20828 Contact with and (suspected) exposure to other viral communicable diseases: Secondary | ICD-10-CM | POA: Diagnosis not present

## 2020-12-19 DIAGNOSIS — R5383 Other fatigue: Secondary | ICD-10-CM | POA: Diagnosis not present

## 2020-12-19 DIAGNOSIS — R051 Acute cough: Secondary | ICD-10-CM | POA: Diagnosis not present

## 2021-01-04 ENCOUNTER — Encounter: Payer: Self-pay | Admitting: Oncology

## 2021-01-29 ENCOUNTER — Encounter: Payer: Self-pay | Admitting: Oncology

## 2021-02-05 DIAGNOSIS — E1169 Type 2 diabetes mellitus with other specified complication: Secondary | ICD-10-CM | POA: Diagnosis not present

## 2021-02-05 DIAGNOSIS — Z79899 Other long term (current) drug therapy: Secondary | ICD-10-CM | POA: Diagnosis not present

## 2021-02-05 DIAGNOSIS — I480 Paroxysmal atrial fibrillation: Secondary | ICD-10-CM | POA: Diagnosis not present

## 2021-02-05 DIAGNOSIS — E785 Hyperlipidemia, unspecified: Secondary | ICD-10-CM | POA: Diagnosis not present

## 2021-02-05 DIAGNOSIS — M810 Age-related osteoporosis without current pathological fracture: Secondary | ICD-10-CM | POA: Diagnosis not present

## 2021-02-05 DIAGNOSIS — I872 Venous insufficiency (chronic) (peripheral): Secondary | ICD-10-CM | POA: Diagnosis not present

## 2021-03-05 DIAGNOSIS — H524 Presbyopia: Secondary | ICD-10-CM | POA: Diagnosis not present

## 2021-03-18 ENCOUNTER — Other Ambulatory Visit: Payer: Self-pay | Admitting: *Deleted

## 2021-03-18 DIAGNOSIS — I480 Paroxysmal atrial fibrillation: Secondary | ICD-10-CM

## 2021-03-18 NOTE — Telephone Encounter (Addendum)
Xarelto 20mg  refill request received. Pt is 87 years old, weight-77.8kg, Crea-1.04 on 02/05/2021 via [facility] from [place], last seen by Dr. Caryl Comes on 10/02/2020, Diagnosis-Afib, CrCl-49.55ml/min; Dose is inappropriate based on dosing criteria. Will send message to Dr. Caryl Comes regarding dose.   Message from Dr. Caryl Comes: states: Deboraha Sprang, MD  Marcos Eke, RN Yes.  We sometimes change little things and their weights are with clothes on :))..        Previous Messages   ----- Message -----  From: Marcos Eke, RN  Sent: 03/19/2021  11:50 AM EST  To: Deboraha Sprang, MD  Subject: Xarelto dose                                   Good Morning!   This pt is pending a Xarelto 20mg  refill. The pt is 87 years old, weight-77.8kg, Crea-1.04 on 02/05/2021 via [facility], Diagnosis-Afib, CrCl-49.10ml/min and previous CrCl-44.50ml/min. Per dosing criteria pt needs reduced dose of Xarelto. I spoke with Melissa, pharmacist, and she stated the pt could remain on current full dose and have labs repeated before reducing dose versus switching to Eliquis. Please advise.   Thank you,  Derrel Nip, RN, BSN    03/24/2021 spoke with pt and she states she has 2 pills left of her Xarelto. She states she had dental surgery yesterday and had bleeding post op, she held her Xarelto two days. She states she will be able to go have labs drawn today before her urology appt. Pt is aware she will go to the [place] office to have them done and that a refill for a 30 day supply of Xarelto was sent until we receive labs and then we will reach out. 30 day supply was sent per conversation with [facility] pharmacist & to avoid any missed doses. Pt was grateful and will expect an update as advised I will do so.

## 2021-03-18 NOTE — Telephone Encounter (Incomplete Revision)
Xarelto 20mg  refill request received. Pt is 87 years old, weight-77.8kg, Crea-1.04 on 02/05/2021 via [facility] from [place], last seen by Dr. Caryl Comes on 10/02/2020, Diagnosis-Afib, CrCl-49.62ml/min; Dose is inappropriate based on dosing criteria. Will send message to Dr. Caryl Comes regarding dose.

## 2021-03-24 DIAGNOSIS — N3946 Mixed incontinence: Secondary | ICD-10-CM | POA: Diagnosis not present

## 2021-03-24 DIAGNOSIS — N952 Postmenopausal atrophic vaginitis: Secondary | ICD-10-CM | POA: Diagnosis not present

## 2021-03-24 DIAGNOSIS — N390 Urinary tract infection, site not specified: Secondary | ICD-10-CM | POA: Diagnosis not present

## 2021-03-24 DIAGNOSIS — B3731 Acute candidiasis of vulva and vagina: Secondary | ICD-10-CM | POA: Diagnosis not present

## 2021-03-24 DIAGNOSIS — I480 Paroxysmal atrial fibrillation: Secondary | ICD-10-CM | POA: Diagnosis not present

## 2021-03-24 LAB — CBC
Hematocrit: 40 % (ref 34.0–46.6)
Hemoglobin: 13.2 g/dL (ref 11.1–15.9)
MCH: 29.1 pg (ref 26.6–33.0)
MCHC: 33 g/dL (ref 31.5–35.7)
MCV: 88 fL (ref 79–97)
Platelets: 197 10*3/uL (ref 150–450)
RBC: 4.54 x10E6/uL (ref 3.77–5.28)
RDW: 13.8 % (ref 11.7–15.4)
WBC: 5.3 10*3/uL (ref 3.4–10.8)

## 2021-03-24 MED ORDER — RIVAROXABAN 20 MG PO TABS
ORAL_TABLET | ORAL | 0 refills | Status: DC
Start: 2021-03-24 — End: 2021-03-25

## 2021-03-25 ENCOUNTER — Other Ambulatory Visit: Payer: Self-pay | Admitting: *Deleted

## 2021-03-25 LAB — BASIC METABOLIC PANEL
BUN/Creatinine Ratio: 15 (ref 12–28)
BUN: 16 mg/dL (ref 8–27)
CO2: 21 mmol/L (ref 20–29)
Calcium: 10.4 mg/dL — ABNORMAL HIGH (ref 8.7–10.3)
Chloride: 102 mmol/L (ref 96–106)
Creatinine, Ser: 1.06 mg/dL — ABNORMAL HIGH (ref 0.57–1.00)
Glucose: 115 mg/dL — ABNORMAL HIGH (ref 70–99)
Potassium: 4.4 mmol/L (ref 3.5–5.2)
Sodium: 141 mmol/L (ref 134–144)
eGFR: 52 mL/min/{1.73_m2} — ABNORMAL LOW (ref >59)

## 2021-03-25 MED ORDER — RIVAROXABAN 15 MG PO TABS: 15.0000 mg | ORAL_TABLET | Freq: Two times a day (BID) | ORAL | 1 refills | Status: DC

## 2021-03-25 NOTE — Telephone Encounter (Signed)
Spoke wit Colleen Galloway regarding Xarelto dose and will reduce to Xarelto 15mg  due to CrCl and previous message from Dr. Caryl Comes. Called pt and let her know the labs she had drawn yesterday and her Xarelto dose will be reduced to 15mg  and she is aware that I will send it in today. She asked if a 90 day supply could be sent and advised I could send it. She states the cost will be $90 for a 3 month supply and it was still affordable. She was thankful for the assistance and will start the new dose once she runs out of then current dose. Removed Xarelto 20mg  off the list at this time.

## 2021-03-29 DIAGNOSIS — J328 Other chronic sinusitis: Secondary | ICD-10-CM | POA: Diagnosis not present

## 2021-03-30 ENCOUNTER — Telehealth: Payer: Self-pay

## 2021-03-30 NOTE — Telephone Encounter (Signed)
-----   Message from Deboraha Sprang, MD sent at 03/29/2021  9:15 PM EST ----- Please Inform Patient That labs  are  stable  Thanks

## 2021-03-30 NOTE — Telephone Encounter (Signed)
Spoke with pt and advised per Dr Caryl Comes labs are stable.  Pt verbalizes understanding and thanked Therapist, sports for the phone call.

## 2021-06-02 DIAGNOSIS — R922 Inconclusive mammogram: Secondary | ICD-10-CM | POA: Diagnosis not present

## 2021-06-02 DIAGNOSIS — N6082 Other benign mammary dysplasias of left breast: Secondary | ICD-10-CM | POA: Diagnosis not present

## 2021-06-09 ENCOUNTER — Encounter: Payer: Self-pay | Admitting: Internal Medicine

## 2021-08-02 DIAGNOSIS — L570 Actinic keratosis: Secondary | ICD-10-CM | POA: Diagnosis not present

## 2021-08-02 DIAGNOSIS — L578 Other skin changes due to chronic exposure to nonionizing radiation: Secondary | ICD-10-CM | POA: Diagnosis not present

## 2021-08-02 DIAGNOSIS — L821 Other seborrheic keratosis: Secondary | ICD-10-CM | POA: Diagnosis not present

## 2021-08-02 DIAGNOSIS — L719 Rosacea, unspecified: Secondary | ICD-10-CM | POA: Diagnosis not present

## 2021-08-06 ENCOUNTER — Other Ambulatory Visit: Payer: Self-pay

## 2021-08-06 DIAGNOSIS — I872 Venous insufficiency (chronic) (peripheral): Secondary | ICD-10-CM | POA: Diagnosis not present

## 2021-08-06 DIAGNOSIS — M810 Age-related osteoporosis without current pathological fracture: Secondary | ICD-10-CM | POA: Diagnosis not present

## 2021-08-06 DIAGNOSIS — E1169 Type 2 diabetes mellitus with other specified complication: Secondary | ICD-10-CM | POA: Diagnosis not present

## 2021-08-06 DIAGNOSIS — E785 Hyperlipidemia, unspecified: Secondary | ICD-10-CM | POA: Diagnosis not present

## 2021-08-06 DIAGNOSIS — Z79899 Other long term (current) drug therapy: Secondary | ICD-10-CM | POA: Diagnosis not present

## 2021-08-06 DIAGNOSIS — I480 Paroxysmal atrial fibrillation: Secondary | ICD-10-CM | POA: Diagnosis not present

## 2021-08-16 ENCOUNTER — Telehealth: Payer: Self-pay

## 2021-08-16 NOTE — Telephone Encounter (Signed)
Pt states, "I wonder if Dr Hinton Rao would send me in a refill of PenLac? She gave me that several years ago (2016) for toe fungus and it did really well". I attempted call to pt to tell her to reach out to her PCP. No answer.   Pt notified @ 1142 that she needed to contact PCP for refill of PenLac. She verbalized understanding.

## 2021-09-11 DIAGNOSIS — R0981 Nasal congestion: Secondary | ICD-10-CM | POA: Diagnosis not present

## 2021-09-11 DIAGNOSIS — J324 Chronic pansinusitis: Secondary | ICD-10-CM | POA: Diagnosis not present

## 2021-09-17 ENCOUNTER — Telehealth: Payer: Self-pay | Admitting: Internal Medicine

## 2021-09-17 DIAGNOSIS — I480 Paroxysmal atrial fibrillation: Secondary | ICD-10-CM

## 2021-09-17 NOTE — Telephone Encounter (Signed)
Pt's Xarelto dose was reduced on 03/25/2021 to '15mg'$  from '20mg'$  due to creatinine clearance of 49.75m/min. Her current creatinine clearance with recent labs on 03/24/2021 is 48.442mmin and the Xarelto '15mg'$  qd dose would be correct.   Pt has been taking Xarelto '15mg'$  once a day, the message states she is out of the meds. Will send in a refill until we can get an answer from the Dr. KlCaryl Comes  Called pt to update her but had to leave a message as she requested in the message & left the [phone] to call back in reference to this. Pt needs to continue meds until a response.  Will also have PharmD assess for any recommendations if any.

## 2021-09-17 NOTE — Telephone Encounter (Signed)
  Pt c/o medication issue:  1. Name of Medication:  Rivaroxaban (XARELTO) 15 MG TABS tablet  2. How are you currently taking this medication (dosage and times per day)? Take 1 tablet (15 mg total) by mouth 2 (two) times daily with a meal.  3. Are you having a reaction (difficulty breathing--STAT)? No   4. What is your medication issue? Pt said, Dr. Caryl Comes decreased her dose from 20 mg to 15 mg. She said she feels like she feels a lot better when she takes '20mg'$ . She wanted to know if she can go back to 20 mg she will ran out of meds on Tuesday. She said she will not be home this afternoon due to family gathering, she said to leave her a detailed message or call her back on monday

## 2021-09-19 NOTE — Telephone Encounter (Signed)
Pt is on correct dose per manufacture. Will need to have Dr. Caryl Comes weigh in. Her last scr was in Feb. Wouldn't be a bad idea to recheck this to see where we stand. She is borderline for dose reduction.

## 2021-09-20 MED ORDER — RIVAROXABAN 15 MG PO TABS: 15.0000 mg | ORAL_TABLET | Freq: Every day | ORAL | 0 refills | Status: DC

## 2021-09-20 NOTE — Telephone Encounter (Signed)
Pt called today and stated she is taking the Xarelto '15mg'$  dose daily until she sees Dr. Caryl Comes. She is aware that the appt is on 12/08/2021 at 1045am. She states she would rather be seen by Dr. Caryl Comes before the decision is made to switch to from one dose to another. Advised I would add this to the original message so a decision would be made by Dr Caryl Comes regarding which Xarelto dose he would prefer.   Also, she states she has been taking the Xarelto '15mg'$  daily but noticed that since being on the '15mg'$  dose she has had pain over her right eye and her feet have been cold-that has improved.

## 2021-09-20 NOTE — Telephone Encounter (Signed)
She is on the borderline with Rivaroxaban-- and we will be doing this dance for a while We could certainly switch her to Apixaban  which for now would be clearly dosed at 5 mg bid Probably easier than the recurrent dancing aroung GFR of 50 :)))

## 2021-09-22 MED ORDER — APIXABAN 5 MG PO TABS: 5.0000 mg | ORAL_TABLET | Freq: Two times a day (BID) | ORAL | 5 refills | Status: DC

## 2021-09-22 NOTE — Telephone Encounter (Signed)
Called pt and reviewed Dr. Olin Pia recommendation. Patient in agreement to swtich to Eliquis. She just picked up a bottle of Xarelto. She will finish out that 30 day supply and then start Eliquis.

## 2021-09-23 DIAGNOSIS — N952 Postmenopausal atrophic vaginitis: Secondary | ICD-10-CM | POA: Diagnosis not present

## 2021-09-23 DIAGNOSIS — N3946 Mixed incontinence: Secondary | ICD-10-CM | POA: Diagnosis not present

## 2021-09-23 DIAGNOSIS — B3731 Acute candidiasis of vulva and vagina: Secondary | ICD-10-CM | POA: Diagnosis not present

## 2021-09-23 DIAGNOSIS — N390 Urinary tract infection, site not specified: Secondary | ICD-10-CM | POA: Diagnosis not present

## 2021-10-21 ENCOUNTER — Telehealth: Payer: Self-pay | Admitting: Internal Medicine

## 2021-10-21 NOTE — Telephone Encounter (Signed)
Pt c/o medication issue:  1. Name of Medication: apixaban (ELIQUIS) 5 MG TABS tablet  2. How are you currently taking this medication (dosage and times per day)? Take 1 tablet (5 mg total) by mouth 2 (two) times daily. Replaces Xarelto  3. Are you having a reaction (difficulty breathing--STAT)? no  4. What is your medication issue? Patient calling with confusion on the medication. She said they told her that she would be taking '15mg'$  of medication and not '10mg'$ . Please advise

## 2021-10-21 NOTE — Telephone Encounter (Signed)
Return call to patient who wanted clarification of the dosage of Eliquis. She was concerned that is was less than the dosage of Xarelto she was on previously.  Provided education on Eliquis and the recommended dosage per Dr. Caryl Comes. Patient verbalized understanding.

## 2021-11-11 DIAGNOSIS — Z1231 Encounter for screening mammogram for malignant neoplasm of breast: Secondary | ICD-10-CM | POA: Diagnosis not present

## 2021-11-12 ENCOUNTER — Ambulatory Visit: Payer: PPO | Admitting: Oncology

## 2021-11-18 ENCOUNTER — Other Ambulatory Visit: Payer: Self-pay | Admitting: Oncology

## 2021-11-18 ENCOUNTER — Encounter: Payer: Self-pay | Admitting: Oncology

## 2021-11-18 ENCOUNTER — Inpatient Hospital Stay: Payer: PPO | Attending: Oncology | Admitting: Oncology

## 2021-11-18 ENCOUNTER — Telehealth: Payer: Self-pay | Admitting: Oncology

## 2021-11-18 VITALS — BP 133/74 | HR 85 | Temp 98.3°F | Resp 18 | Ht 64.0 in | Wt 176.1 lb

## 2021-11-18 DIAGNOSIS — C50512 Malignant neoplasm of lower-outer quadrant of left female breast: Secondary | ICD-10-CM | POA: Diagnosis not present

## 2021-11-18 DIAGNOSIS — B351 Tinea unguium: Secondary | ICD-10-CM

## 2021-11-18 DIAGNOSIS — M810 Age-related osteoporosis without current pathological fracture: Secondary | ICD-10-CM

## 2021-11-18 DIAGNOSIS — Z170 Estrogen receptor positive status [ER+]: Secondary | ICD-10-CM | POA: Diagnosis not present

## 2021-11-18 MED ORDER — CICLOPIROX 8 % EX SOLN: Freq: Every day | CUTANEOUS | 0 refills | Status: DC

## 2021-11-18 NOTE — Progress Notes (Signed)
[facility]  [address] [place],  [place]  [address] [phone]  Clinic Day:  11/18/2021  Referring physician: Nicoletta Dress, MD  This document serves as a record of services personally performed by Hosie Poisson, MD. It was created on their behalf by [employer] E, a trained medical scribe. The creation of this record is based on the scribe's personal observations and the provider's statements to them.  CHIEF COMPLAINT:  CC: History of stage IIA hormone receptor and HER2 positive left breast cancer  Current Treatment:  Surveillance   HISTORY OF PRESENT ILLNESS:  Colleen Galloway is a 87 y.o. female with a history of stage IIA (T2 N0 M0) hormone and HER 2 Neu receptor positive left breast cancer diagnosed in August 2015.  She was treated with lumpectomy.  Pathology revealed a 2.4 cm, grade 3, invasive ductal carcinoma with negative sentinel node.  Estrogen and progesterone receptors were positive and HER 2 positive.  Ki 67 was 78%.  She was given adjuvant chemotherapy with weekly Abraxane and trastuzumab for 12 weeks, which was completed at the end of December 2015.  She was referred to [place] in January 2016 to complete her trastuzumab, which finished in October 2016.  She received adjuvant radiation to the left breast, completed in March 2016.  She was placed on hormonal therapy with anastrozole 1 mg daily in April 2016. She has had some mild elevation of the liver transaminases.  She had osteoporosis on bone density scan in January 2016, with a T-score of -2.8 in the femur and a T-score of -2.3 in the forearm.  She was placed on Prolia in May 2016, but then declined further Prolia, as she felt she had too many side effects.  We recommended treatment for the osteoporosis, as anastrozole has the potential to worsen this. She continues calcium and vitamin-D twice daily.  She has occasional episodes of atrial fibrillation and was found to be in atrial  fibrillation in early October and saw Dr. Caryl Comes, her cardiologist.  She was found to have pulmonary artery hypertension. She complains of a pinched nerve in her lumbar spine and slight scoliosis, and rates her back and hip pain at a 6/10. She saw Dr. Lyda Jester and had upper endoscopy, where she was found to have a polyp of her stomach which was removed.  Dr. Delena Bali does her labs on a regular basis.  She also had a bone density scan done in January of 2018, and this showed osteoporosis but the spine had improved by 2.5% and the femur by 3%.  Her T-score of the femur was still -2.5.  She has had her shingles vaccine. She completed 5 years of anastrozole in May 2021. Bone density scan from April 2021 revealed osteoporosis with a T-score of -3.1 of the left forearm radius, previously -2.5.  The right femur neck measures -2.7, previously -2.5, and the dual femur total mean is stable at -2.2.   INTERVAL HISTORY:  Colleen Galloway is here for annual follow up and states that she has been well. Annual screening bilateral mammogram from September 26th revealed a developing 1.3 cm density in the left breast, which is indeterminate. She is scheduled for diagnostic left mammogram and ultrasound on October 6th. She does note heaviness of the right breast and asymmetry in size. I gave her a brochure of [employer] to be fitted for a partial breast prosthesis and mastectomy bras. She notes intermittent UTI throughout the year. She has been seen  by Clare Gandy and was prescribed medication with improvement. She undergoes routine blood work with Dr. Delena Bali, and lab work from June was unremarkable.  Her  appetite is good, and she has lost 8 and 1/2 pounds since her last visit.  She denies fever, chills or other signs of infection.  She denies nausea, vomiting, bowel issues, or abdominal pain.  She denies sore throat, cough, dyspnea, or chest pain.  REVIEW OF SYSTEMS:  Review of Systems  Constitutional: Negative.  Negative for  appetite change, chills, fatigue, fever and unexpected weight change.  HENT:  Negative.    Eyes: Negative.   Respiratory: Negative.  Negative for chest tightness, cough, hemoptysis, shortness of breath and wheezing.   Cardiovascular: Negative.  Negative for chest pain, leg swelling and palpitations.  Gastrointestinal: Negative.  Negative for abdominal distention, abdominal pain, blood in stool, constipation, diarrhea, nausea and vomiting.  Endocrine: Negative.   Genitourinary:  Negative for difficulty urinating, dysuria, frequency and hematuria.        Intermittent UTI throughout the year  Musculoskeletal:  Negative for arthralgias, back pain, flank pain, gait problem and myalgias.       Heaviness of the right breast and asymmetry  Skin: Negative.   Neurological: Negative.  Negative for dizziness, extremity weakness, gait problem, headaches, light-headedness, numbness, seizures and speech difficulty.  Hematological: Negative.   Psychiatric/Behavioral: Negative.  Negative for depression and sleep disturbance. The patient is not nervous/anxious.     VITALS:  There were no vitals taken for this visit.  Wt Readings from Last 3 Encounters:  11/12/20 171 lb 9.6 oz (77.8 kg)  10/28/20 169 lb (76.7 kg)  10/02/20 173 lb (78.5 kg)    There is no height or weight on file to calculate BMI.  Performance status (ECOG): 1 - Symptomatic but completely ambulatory  PHYSICAL EXAM:  Physical Exam Constitutional:      General: She is not in acute distress.    Appearance: Normal appearance. She is normal weight.  HENT:     Head: Normocephalic and atraumatic.  Eyes:     General: No scleral icterus.    Extraocular Movements: Extraocular movements intact.     Conjunctiva/sclera: Conjunctivae normal.     Pupils: Pupils are equal, round, and reactive to light.  Cardiovascular:     Rate and Rhythm: Normal rate and regular rhythm.     Pulses: Normal pulses.     Heart sounds: Normal heart sounds. No  murmur heard.    No friction rub. No gallop.  Pulmonary:     Effort: Pulmonary effort is normal. No respiratory distress.     Breath sounds: Normal breath sounds.  Chest:     Comments: Large firm scar in the upper left breast. There is a 3 x 2 cm seroma. Both breasts are without masses. Abdominal:     General: Bowel sounds are normal. There is no distension.     Palpations: Abdomen is soft. There is no hepatomegaly, splenomegaly or mass.     Tenderness: There is no abdominal tenderness.  Musculoskeletal:        General: Normal range of motion.     Cervical back: Normal range of motion and neck supple.     Right lower leg: No edema.     Left lower leg: No edema.  Lymphadenopathy:     Cervical: No cervical adenopathy.  Skin:    General: Skin is warm and dry.  Neurological:     General: No focal deficit present.  Mental Status: She is alert and oriented to person, place, and time. Mental status is at baseline.  Psychiatric:        Mood and Affect: Mood normal.        Behavior: Behavior normal.        Thought Content: Thought content normal.        Judgment: Judgment normal.    LABS:      Latest Ref Rng & Units 03/24/2021    2:12 PM 08/06/2019   12:00 AM 03/14/2017    3:34 PM  CBC  WBC 3.4 - 10.8 x10E3/uL 5.3  5.5  8.5   Hemoglobin 11.1 - 15.9 g/dL 13.2  13.5  14.7   Hematocrit 34.0 - 46.6 % 40.0  41  43.3   Platelets 150 - 450 x10E3/uL 197  191  285       Latest Ref Rng & Units 03/24/2021    2:08 PM 08/06/2019   12:00 AM 10/31/2017    2:48 PM  CMP  Glucose 70 - 99 mg/dL 115   125   BUN 8 - 27 mg/dL 16  13  14    Creatinine 0.57 - 1.00 mg/dL 1.06  1.0  0.99   Sodium 134 - 144 mmol/L 141  139  142   Potassium 3.5 - 5.2 mmol/L 4.4  4.7  3.7   Chloride 96 - 106 mmol/L 102  104  104   CO2 20 - 29 mmol/L 21  24  22    Calcium 8.7 - 10.3 mg/dL 10.4  9.8  9.8   Alkaline Phos 25 - 125  87    AST 13 - 35  39    ALT 7 - 35  29      STUDIES:  No results found.   DIGITAL  SCREENING BILATERAL MAMMOGRAM: 11/09/2020 Breast Composition Category C IMPRESSION: The developing 1.3 cm density in the left breast is indeterminate. A diagnostic mammogram and ultrasound is recommended.  HISTORY:   Allergies:  Allergies  Allergen Reactions   Flecainide Shortness Of Breath, Swelling and Other (See Comments)    Sob, visual problems, swelling ankles   Nebivolol Shortness Of Breath and Swelling    Sob, visual problems, swelling in ankles   Diltiazem Hcl Swelling    Pt states it causes her ankle swelling    Lopressor [Metoprolol Tartrate]     Severe dizziness. Irregular heartbeat    Sodium Pantothenate Nausea And Vomiting    Current Medications: Current Outpatient Medications  Medication Sig Dispense Refill   apixaban (ELIQUIS) 5 MG TABS tablet Take 1 tablet (5 mg total) by mouth 2 (two) times daily. Replaces Xarelto 60 tablet 5   calcium citrate-vitamin D (CITRACAL+D) 315-200 MG-UNIT per tablet Take 1 tablet by mouth daily.      conjugated estrogens (PREMARIN) vaginal cream Place 1 applicator vaginally at bedtime. 3 times weekly only     doxycycline (VIBRAMYCIN) 50 MG capsule Take 50 mg by mouth daily as needed.     fenofibrate 160 MG tablet Take 160 mg by mouth daily after supper.      furosemide (LASIX) 20 MG tablet Take 1 tablet (20 mg total) by mouth daily. 90 tablet 3   meclizine (ANTIVERT) 25 MG tablet Take 1 tablet (25 mg total) by mouth 3 (three) times daily as needed for dizziness. 90 tablet 2   Methenamine-Sodium Salicylate (AZO URINARY TRACT DEFENSE PO) Take by mouth.     metoprolol succinate (TOPROL-XL) 25 MG 24 hr tablet Take 75 mg  by mouth daily.     Multiple Vitamin (MULTIVITAMIN) capsule Take 1 capsule by mouth daily.       nitrofurantoin, macrocrystal-monohydrate, (MACROBID) 100 MG capsule Take by mouth.     omeprazole (PRILOSEC) 20 MG capsule Take 20 mg by mouth daily.       No current facility-administered medications for this visit.      ASSESSMENT & PLAN:   Assessment/Plan:   History of stage IIA left breast cancer diagnosed in August 2015, over 7 years ago which was hormone positive and HER 2 positive.  She was treated with surgery, chemotherapy, HER 2 directed therapy, adjuvant radiation and hormonal therapy.  She completed 5 years of anastrozole in May 2021.   Osteoporosis. Her bone density scan shows some worsening but she is now off the hormonal therapy.  She will be due for repeat bone density in April 2023, which we will schedule.  Indeterminate 1.3 cm density in the left breast. We will obtain diagnostic mammogram and ultrasound for further evaluation.  We will order diagnostic mammogram and ultrasound of the left breast for further evaluation of the 1.3 cm density. If this turns out to be abnormal we will arrange for biopsy and surgical referral. Bone density will be schedule for April. If all is well, I can see her back in 1 year with bilateral mammogram for reexamination. She understands and agrees with this plan of care.   I provided 20 minutes of face-to-face time during this this encounter and > 50% was spent counseling as documented under my assessment and plan.    Derwood Kaplan, MD [facility] AT [facility] [address] [place] [address] Dept: [phone] Dept Fax: [phone]   I, Rita Ohara, am acting as scribe for Derwood Kaplan, MD  I have reviewed this report as typed by the medical scribe, and it is complete and accurate.  Derwood Kaplan, MD

## 2021-11-18 NOTE — Progress Notes (Signed)
[facility]  [address] [place],  [place]  [address] [phone]  Clinic Day:  11/18/21  Referring physician: Nicoletta Dress, MD    CHIEF COMPLAINT:  CC: History of stage IIA hormone receptor and HER2 positive left breast cancer  Current Treatment:  Surveillance   HISTORY OF PRESENT ILLNESS:  Colleen Galloway is a 87 y.o. female with a history of stage IIA (T2 N0 M0) hormone and HER 2 Neu receptor positive left breast cancer diagnosed in August 2015.  She was treated with lumpectomy.  Pathology revealed a 2.4 cm, grade 3, invasive ductal carcinoma with negative sentinel node.  Estrogen and progesterone receptors were positive and HER 2 positive.  Ki 67 was 78%.  She was given adjuvant chemotherapy with weekly Abraxane and trastuzumab for 12 weeks, which was completed at the end of December 2015.  She was referred to [place] in January 2016 to complete her trastuzumab, which finished in October 2016.  She received adjuvant radiation to the left breast, completed in March 2016.  She was placed on hormonal therapy with anastrozole 1 mg daily in April 2016. She has had some mild elevation of the liver transaminases.  She had osteoporosis on bone density scan in January 2016, with a T-score of -2.8 in the femur and a T-score of -2.3 in the forearm.  She was placed on Prolia in May 2016, but then declined further Prolia, as she felt she had too many side effects.  We recommended treatment for the osteoporosis, as anastrozole has the potential to worsen this. She continues calcium and vitamin-D twice daily.  She has occasional episodes of atrial fibrillation and was found to be in atrial fibrillation in early October and saw Dr. Caryl Comes, her cardiologist.  She was found to have pulmonary artery hypertension. She complains of a pinched nerve in her lumbar spine and slight scoliosis, and rates her back and hip pain at a 6/10. She saw Dr. Lyda Jester and had upper endoscopy,  where she was found to have a polyp of her stomach which was removed.  Dr. Delena Bali does her labs on a regular basis.  She also had a bone density scan done in January of 2018, and this showed osteoporosis but the spine had improved by 2.5% and the femur by 3%.  Her T-score of the femur was still -2.5.  She has had her shingles vaccine. She completed 5 years of anastrozole in May 2021. Bone density scan from April 2021 revealed osteoporosis with a T-score of -3.1 of the left forearm radius, previously -2.5.  The right femur neck measures -2.7, previously -2.5, and the dual femur total mean is stable at -2.2.   INTERVAL HISTORY:  Colleen Galloway is here for annual follow up and states that she has been well, she is now over 8 years post-op from her stage II breast cancer. She has been on Eliquis 5 mg twice daily for 1 month, due to her atrial fibrillation, and she states it has made her feel bad, describing it has light headedness/dizziness.I advised her to discuss this with her cardiologist, Dr. Caryl Comes. She had previously been on Xarelto and was not sure why it was changed. She has had to use Meclizine several times since starting the Eliquis. She had ecchymosis on her forearms. She also notes she may have some type of infection in her toenails. She has requested Penlac to help with this.  She undergoes routine blood work with Dr. Delena Bali, and lab work  from June was unremarkable. 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 Her  appetite is good, and she has gained 5 pounds since her last weight. She denies fever, chills or other signs of infection.  She denies nausea, vomiting, bowel issues, or abdominal pain.  She denies sore throat, cough, dyspnea, or chest pain.  REVIEW OF SYSTEMS:  Review of Systems  Constitutional: Negative.  Negative for appetite change, chills, fatigue, fever and unexpected weight change.   HENT:  Negative.    Eyes: Negative.   Respiratory: Negative.  Negative for chest tightness, cough, hemoptysis, shortness of breath and wheezing.   Cardiovascular: Negative.  Negative for chest pain, leg swelling and palpitations.  Gastrointestinal: Negative.  Negative for abdominal distention, abdominal pain, blood in stool, constipation, diarrhea, nausea and vomiting.  Endocrine: Negative.   Genitourinary:  Negative for difficulty urinating, dysuria, frequency and hematuria.        Intermittent UTI throughout the year  Musculoskeletal:  Negative for arthralgias, back pain, flank pain, gait problem and myalgias.       Heaviness of the right breast and asymmetry  Skin: Negative.   Neurological: Negative.  Negative for dizziness, extremity weakness, gait problem, headaches, light-headedness, numbness, seizures and speech difficulty.  Hematological: Negative.   Psychiatric/Behavioral: Negative.  Negative for depression and sleep disturbance. The patient is not nervous/anxious.      VITALS:  Blood pressure 133/74, pulse 85, temperature 98.3 F (36.8 C), temperature source Oral, resp. rate 18, height 5' 4"  (1.626 m), weight 176 lb 1.6 oz (79.9 kg), SpO2 97 %.  Wt Readings from Last 3 Encounters:  12/08/21 172 lb 12.8 oz (78.4 kg)  11/18/21 176 lb 1.6 oz (79.9 kg)  11/12/20 171 lb 9.6 oz (77.8 kg)    Body mass index is 30.23 kg/m.  Performance status (ECOG): 1 - Symptomatic but completely ambulatory  PHYSICAL EXAM:  Physical Exam Constitutional:      General: She is not in acute distress.    Appearance: Normal appearance. She is normal weight.  HENT:     Head: Normocephalic and atraumatic.     Right Ear: Tympanic membrane, ear canal and external ear normal.     Left Ear: Tympanic membrane, ear canal and external ear normal.     Ears:     Comments: Tiny amount of fluid in both ears Eyes:     General: No scleral icterus.    Extraocular Movements: Extraocular movements intact.      Conjunctiva/sclera: Conjunctivae normal.     Pupils: Pupils are equal, round, and reactive to light.  Cardiovascular:     Rate and Rhythm: Rhythm irregular.     Pulses: Normal pulses.     Heart sounds: Normal heart sounds. No murmur heard.    No friction rub. No gallop.     Comments: Heart rate is mildly irregular Pulmonary:     Effort: Pulmonary effort is normal. No respiratory distress.     Breath sounds: Normal breath sounds.  Chest:     Comments: Hard scar in the upper left breast measuring 4 cm across but all consistent with scar tissue. Abdominal:     General: Bowel sounds are normal. There is no distension.     Palpations: Abdomen is soft. There is no hepatomegaly, splenomegaly or mass.     Tenderness: There is no abdominal tenderness.  Musculoskeletal:  General: Normal range of motion.     Cervical back: Normal range of motion and neck supple.     Right lower leg: No edema.     Left lower leg: No edema.  Lymphadenopathy:     Cervical: No cervical adenopathy.  Skin:    General: Skin is warm and dry.  Neurological:     General: No focal deficit present.     Mental Status: She is alert and oriented to person, place, and time. Mental status is at baseline.  Psychiatric:        Mood and Affect: Mood normal.        Behavior: Behavior normal.        Thought Content: Thought content normal.        Judgment: Judgment normal.     LABS:      Latest Ref Rng & Units 03/24/2021    2:12 PM 08/06/2019   12:00 AM 03/14/2017    3:34 PM  CBC  WBC 3.4 - 10.8 x10E3/uL 5.3  5.5  8.5   Hemoglobin 11.1 - 15.9 g/dL 13.2  13.5  14.7   Hematocrit 34.0 - 46.6 % 40.0  41  43.3   Platelets 150 - 450 x10E3/uL 197  191  285       Latest Ref Rng & Units 03/24/2021    2:08 PM 08/06/2019   12:00 AM 10/31/2017    2:48 PM  CMP  Glucose 70 - 99 mg/dL 115   125   BUN 8 - 27 mg/dL 16  13  14    Creatinine 0.57 - 1.00 mg/dL 1.06  1.0  0.99   Sodium 134 - 144 mmol/L 141  139  142   Potassium  3.5 - 5.2 mmol/L 4.4  4.7  3.7   Chloride 96 - 106 mmol/L 102  104  104   CO2 20 - 29 mmol/L 21  24  22    Calcium 8.7 - 10.3 mg/dL 10.4  9.8  9.8   Alkaline Phos 25 - 125  87    AST 13 - 35  39    ALT 7 - 35  29      STUDIES:   3D MAMMOGRAM DIAGNOSTIC UNILATERAL DIGITAL W/CAD - BREAST -Bilateral 11/11/21 IMPRESSION: Stable left lumpectomy. No evidence of malignancy.  HISTORY:   Allergies:  Allergies  Allergen Reactions   Flecainide Shortness Of Breath, Swelling and Other (See Comments)    Sob, visual problems, swelling ankles   Nebivolol Shortness Of Breath and Swelling    Sob, visual problems, swelling in ankles   Diltiazem Hcl Swelling    Pt states it causes her ankle swelling    Lopressor [Metoprolol Tartrate]     Severe dizziness. Irregular heartbeat    Sodium Pantothenate Nausea And Vomiting    Current Medications: Current Outpatient Medications  Medication Sig Dispense Refill   calcium citrate-vitamin D (CITRACAL+D) 315-200 MG-UNIT per tablet Take 1 tablet by mouth daily.      conjugated estrogens (PREMARIN) vaginal cream Place 1 applicator vaginally at bedtime. 3 times weekly only     doxycycline (VIBRAMYCIN) 50 MG capsule Take 50 mg by mouth daily as needed.     fenofibrate 160 MG tablet Take 160 mg by mouth daily after supper.      furosemide (LASIX) 20 MG tablet Take 1 tablet (20 mg total) by mouth daily. (Patient taking differently: Take 20 mg by mouth as needed.) 90 tablet 3   meclizine (ANTIVERT) 25 MG tablet Take 1  tablet (25 mg total) by mouth 3 (three) times daily as needed for dizziness. 90 tablet 2   Methenamine-Sodium Salicylate (AZO URINARY TRACT DEFENSE PO) Take by mouth. As needed     metoprolol succinate (TOPROL-XL) 25 MG 24 hr tablet Take 75 mg by mouth daily.     Multiple Vitamin (MULTIVITAMIN) capsule Take 1 capsule by mouth daily.       omeprazole (PRILOSEC) 20 MG capsule Take 20 mg by mouth daily.       Rivaroxaban (XARELTO) 15 MG TABS tablet  Take 1 tablet (15 mg total) by mouth daily with supper. 90 tablet 3   No current facility-administered medications for this visit.     ASSESSMENT & PLAN:   Assessment/Plan:   History of stage IIA left breast cancer diagnosed in August 2015, over 8 years ago which was hormone positive and HER 2 positive.  She was treated with surgery, chemotherapy, HER 2 directed therapy, adjuvant radiation and hormonal therapy.  She completed 5 years of anastrozole in May 2021.   Osteoporosis. Her bone density scan shows some worsening but she is now off the hormonal therapy.  She was due for repeat bone density in April 2023, bur prefers to wait until next year.    We will plan to schedule a bone density scan next year, also her annual mammogram is scheduled for October 2024.. I have prescribed Penlac for her fungus infection for her toe nails. I can see her back in 1 year with bilateral mammogram for reexamination. She understands and agrees with this plan of care.   I provided 20 minutes of face-to-face time during this this encounter and > 50% was spent counseling as documented under my assessment and plan.    Derwood Kaplan, MD [facility] AT [facility] [address] [place] [address] Dept: [phone] Dept Fax: [phone]    Ninetta Lights as a scribe for Derwood Kaplan, MD.,have documented all relevant documentation on the behalf of Derwood Kaplan, MD,as directed by  Derwood Kaplan, MD while in the presence of Derwood Kaplan, MD.   I have reviewed this report as typed by the medical scribe, and it is complete and accurate.  Derwood Kaplan, MD

## 2021-11-18 NOTE — Telephone Encounter (Signed)
Patient has been scheduled for follow-up visit per 11/18/21 los. Pt given an appt calendar with date and time.   

## 2021-11-19 ENCOUNTER — Telehealth: Payer: Self-pay | Admitting: Oncology

## 2021-11-19 NOTE — Telephone Encounter (Signed)
Dexa scan has been scheduled for 11/16/22; Checking in @ 1pm  Unable to reach pt via phone to notify her of date,time and instructions. Patients voicemail is not set up on her cell - mailed the patient a copy of the order.

## 2021-11-25 ENCOUNTER — Telehealth: Payer: Self-pay

## 2021-11-25 NOTE — Telephone Encounter (Signed)
Received letter from pt dated 11/11/2021 to Dr Caryl Comes that states she is currently taking Eliquis '5mg'$  - 1 tablet by mouth bid which was started on 10/19/2021.  Pt states she has felt dizziness, light headedness and staggering a few times since starting Eliquis.  BP at the time letter was written was 149/71 and HR-71.  Has had some palpitations.  She states dizziness did improve with Meclizine '25mg'$  which she has taken 2-3 times since being on the Eliquis.  She would like Dr Olin Pia advice for returning to taking Xarelto.

## 2021-11-25 NOTE — Telephone Encounter (Signed)
Phone call to pt and advised Xarelto was changed to Eliquis due to pt's renal function.  Pt states dizziness and lightheadedness has improved.  She reports increased SOB some days and just doesn't think she feels as well being on the Eliquis.  Pt is scheduled to see Dr Caryl Comes on 12/08/2021.  Advised she will need some lab work at that appointment.  Pt's last echo was 2018.  Since dizziness and lightheadedness has improved pt is willing to wait until she sees Dr Caryl Comes to discuss Eliquis further.  She will contact office if symptoms change or increase.  Pt verbalizes understanding and agrees with current plan.

## 2021-12-03 DIAGNOSIS — I4891 Unspecified atrial fibrillation: Secondary | ICD-10-CM | POA: Insufficient documentation

## 2021-12-03 DIAGNOSIS — I4819 Other persistent atrial fibrillation: Secondary | ICD-10-CM | POA: Insufficient documentation

## 2021-12-08 ENCOUNTER — Ambulatory Visit: Payer: PPO | Attending: Internal Medicine | Admitting: Internal Medicine

## 2021-12-08 ENCOUNTER — Encounter: Payer: Self-pay | Admitting: Internal Medicine

## 2021-12-08 VITALS — BP 140/82 | HR 74 | Ht 64.0 in | Wt 172.8 lb

## 2021-12-08 DIAGNOSIS — R0602 Shortness of breath: Secondary | ICD-10-CM | POA: Diagnosis not present

## 2021-12-08 DIAGNOSIS — I4819 Other persistent atrial fibrillation: Secondary | ICD-10-CM | POA: Diagnosis not present

## 2021-12-08 DIAGNOSIS — I5032 Chronic diastolic (congestive) heart failure: Secondary | ICD-10-CM | POA: Diagnosis not present

## 2021-12-08 MED ORDER — RIVAROXABAN 15 MG PO TABS: 15.0000 mg | ORAL_TABLET | Freq: Every day | ORAL | 3 refills | Status: AC

## 2021-12-08 NOTE — Patient Instructions (Addendum)
Medication Instructions:  Your physician has recommended you make the following change in your medication:   ** Stop Eliquis  ** Begin Xarelto '15mg'$  - 1 tablet by mouth daily with supper.   *If you need a refill on your cardiac medications before your next appointment, please call your pharmacy*   Lab Work: None ordered.  If you have labs (blood work) drawn today and your tests are completely normal, you will receive your results only by: [place] (if you have MyChart) OR A paper copy in the mail If you have any lab test that is abnormal or we need to change your treatment, we will call you to review the results.   Testing/Procedures: Your physician has requested that you have an echocardiogram. Echocardiography is a painless test that uses sound waves to create images of your heart. It provides your doctor with information about the size and shape of your heart and how well your heart's chambers and valves are working. This procedure takes approximately one hour. There are no restrictions for this procedure. Please do NOT wear cologne, perfume, aftershave, or lotions (deodorant is allowed). Please arrive 15 minutes prior to your appointment time.    Follow-Up: At [facility], you and your health needs are our priority.  As part of our continuing mission to provide you with exceptional heart care, we have created designated Provider Care Teams.  These Care Teams include your primary Cardiologist (physician) and Advanced Practice Providers (APPs -  Physician Assistants and Nurse Practitioners) who all work together to provide you with the care you need, when you need it.  We recommend signing up for the patient portal called "MyChart".  Sign up information is provided on this After Visit Summary.  MyChart is used to connect with patients for Virtual Visits (Telemedicine).  Patients are able to view lab/test results, encounter notes, upcoming appointments, etc.  Non-urgent  messages can be sent to your provider as well.   To learn more about what you can do with MyChart, go to [email or web address].    Your next appointment:   12 months with Dr Caryl Comes  Important Information About Sugar

## 2021-12-08 NOTE — Progress Notes (Signed)
Patient Care Team: Nicoletta Dress, MD as PCP - General (Internal Medicine) Deboraha Sprang, MD as Attending Physician (Cardiology) Gatha Mayer, MD as Consulting Physician (Radiation Oncology) Derwood Kaplan, MD as Consulting Physician (Oncology)   HPI  Colleen Galloway is a 87 y.o. female Seen in followup for atrial fibrillation-persistent for which she is anticoagulated w Rivaroxaban; this was changed to Apixaban  as her GFR was hovering around 50.  She had problems with edema on calcium channel blockers and has been managed with beta blockers.  She was diagnosed with breast cancer and underwent partial mastectomy, chemotherapy and radiation therapy. She has now completed treatment.  Anticoagulation was changed.  Is her impression that she is having more gait instability and not feeling overall is well on Eliquis compared to Xarelto.  She would like to return to Xarelto  Also has noted more dyspnea on exertion and this notably over the last 3-6 months.  No nocturnal dyspnea trace peripheral edema.  No chest pain.  Has noted her heart beats are irregular and her blood pressure monitor       Date Cr Hgb K  6/18 0.76 13.5   9/19   0.99 14.7   6/22 1.08 12.9 4.3  6/23 1.15 13.7 4.5     DATE TEST EF   3/16 Echo   55-60 % PA sys 38  10/18 Echo   55 %  PA Sys 50          Past Medical History:  Diagnosis Date   Allergy    Arthritis    Breast cancer ([place]) 09/17/13   Left iNVASIVE DUCTAL,dcis   Calculus of kidney    kidney stones   Carotid artery disease ([place])    Followed by Dr. Scot Dock   CHF (congestive heart failure) (HCC)    Esophageal reflux    Insomnia, unspecified    Lumbosacral spondylosis without myelopathy    Orthostatic lightheadedness 10/20/2011   Osteoarthrosis, unspecified whether generalized or localized, unspecified site    Persistent atrial fibrillation (HCC)    PONV (postoperative nausea and vomiting)     Past Surgical History:   Procedure Laterality Date   BREAST LUMPECTOMY WITH NEEDLE LOCALIZATION AND AXILLARY SENTINEL LYMPH NODE BX Left 10/18/2013   Procedure: LEFT AXILLARY LYMPHATIC MAPPING; INJECTION OF METHYLENE BLUE INTO LEFT BREAST; LEFT BREAST PARTIAL MASTECTOMY AFTER NEEDLE LOCALIZATION; AXILLARY SENTINEL LYMPH NODE BIOPSY;  Surgeon: Jackolyn Confer, MD;  Location: [place];  Service: General;  Laterality: Left;   DILATION AND CURETTAGE OF UTERUS  1960   KIDNEY STONE SURGERY     PORT-A-CATH REMOVAL Right 12/26/2014   Procedure: REMOVAL PORT-A-CATH;  Surgeon: Jackolyn Confer, MD;  Location: [facility];  Service: General;  Laterality: Right;   PORTACATH PLACEMENT N/A 10/18/2013   Procedure: INSERTION PORT-A-CATH/ULTRASOUND GUIDED,RIGHT INTERNAL JUGULAR;  Surgeon: Jackolyn Confer, MD;  Location: [place];  Service: General;  Laterality: N/A;   varicose veins      Current Outpatient Medications  Medication Sig Dispense Refill   apixaban (ELIQUIS) 5 MG TABS tablet Take 1 tablet (5 mg total) by mouth 2 (two) times daily. Replaces Xarelto 60 tablet 5   calcium citrate-vitamin D (CITRACAL+D) 315-200 MG-UNIT per tablet Take 1 tablet by mouth daily.      conjugated estrogens (PREMARIN) vaginal cream Place 1 applicator vaginally at bedtime. 3 times weekly only     doxycycline (VIBRAMYCIN) 50 MG capsule Take 50 mg by mouth daily as needed.  fenofibrate 160 MG tablet Take 160 mg by mouth daily after supper.      furosemide (LASIX) 20 MG tablet Take 1 tablet (20 mg total) by mouth daily. (Patient taking differently: Take 20 mg by mouth as needed.) 90 tablet 3   meclizine (ANTIVERT) 25 MG tablet Take 1 tablet (25 mg total) by mouth 3 (three) times daily as needed for dizziness. 90 tablet 2   Methenamine-Sodium Salicylate (AZO URINARY TRACT DEFENSE PO) Take by mouth. As needed     metoprolol succinate (TOPROL-XL) 25 MG 24 hr tablet Take 75 mg by mouth daily.     Multiple Vitamin (MULTIVITAMIN) capsule Take 1  capsule by mouth daily.       omeprazole (PRILOSEC) 20 MG capsule Take 20 mg by mouth daily.       No current facility-administered medications for this visit.    Allergies  Allergen Reactions   Flecainide Shortness Of Breath, Swelling and Other (See Comments)    Sob, visual problems, swelling ankles   Nebivolol Shortness Of Breath and Swelling    Sob, visual problems, swelling in ankles   Diltiazem Hcl Swelling    Pt states it causes her ankle swelling    Lopressor [Metoprolol Tartrate]     Severe dizziness. Irregular heartbeat    Sodium Pantothenate Nausea And Vomiting    Review of Systems negative except from HPI and PMH  Physical Exam BP (!) 140/82   Pulse 74   Ht '5\' 4"'$  (1.626 m)   Wt 172 lb 12.8 oz (78.4 kg)   SpO2 97%   BMI 29.66 kg/m  Well developed and well nourished in no acute distress HENT normal Neck supple with JVP 7-8 Clear Device pocket well healed; without hematoma or erythema.  There is no tethering  Irregular irregular rate and rhythm without gallop No  murmur Abd-soft with active BS No Clubbing cyanosis trace edema Skin-warm and dry A & Oriented  Grossly normal sensory and motor function  ECG atrial fibrillation  8/22 sinus 8/21 A-fib 12/20 A-fib  Assessment and  Plan  Atrial fibrillation--flutter persistent  Hypertension  HFpEF chronic/acute  Breast Cancer-therapy     Pulmonary hypertension  Renal insufficiency grade 3a   The patient has worsening dyspnea.  Mild volume overload.  We will add a low-dose short-term diuretic furosemide 20 mg x 3 days.    We will obtain an echocardiogram to reassess LV function as this would inform preliminary discussions regarding rhythm control strategies and the need for antiarrhythmic drugs.  She would like to come off of the apixaban because of potential secondary effects.  We will resume Xarelto at 15 mg a day with her GFR under 50. No clinical bleeding        .

## 2021-12-20 ENCOUNTER — Encounter: Payer: Self-pay | Admitting: Oncology

## 2021-12-28 ENCOUNTER — Ambulatory Visit (HOSPITAL_COMMUNITY): Payer: PPO | Attending: Internal Medicine

## 2021-12-28 DIAGNOSIS — R0602 Shortness of breath: Secondary | ICD-10-CM | POA: Diagnosis not present

## 2021-12-28 DIAGNOSIS — I4819 Other persistent atrial fibrillation: Secondary | ICD-10-CM | POA: Diagnosis not present

## 2021-12-28 DIAGNOSIS — I5032 Chronic diastolic (congestive) heart failure: Secondary | ICD-10-CM | POA: Diagnosis not present

## 2021-12-28 LAB — ECHOCARDIOGRAM COMPLETE: S' Lateral: 3 cm

## 2022-01-12 ENCOUNTER — Telehealth: Payer: Self-pay | Admitting: Internal Medicine

## 2022-01-12 NOTE — Telephone Encounter (Signed)
Patient is calling for her echo results.   

## 2022-01-13 NOTE — Telephone Encounter (Signed)
Spoke with pt and advised of echo results per Dr Caryl Comes as below.  Pt verbalizes understanding and thanked Therapist, sports for the call.  Deboraha Sprang, MD 01/10/2022 12:49 PM EST     Please Inform Patient Echo showed  normal and  stable heart muscle function  She has atrial enlarfgement, consistent with hx of afib, which dates back at least to 2016

## 2022-02-01 DIAGNOSIS — N390 Urinary tract infection, site not specified: Secondary | ICD-10-CM | POA: Diagnosis not present

## 2022-02-08 DIAGNOSIS — K591 Functional diarrhea: Secondary | ICD-10-CM | POA: Diagnosis not present

## 2022-02-08 DIAGNOSIS — R059 Cough, unspecified: Secondary | ICD-10-CM | POA: Diagnosis not present

## 2022-02-08 DIAGNOSIS — J069 Acute upper respiratory infection, unspecified: Secondary | ICD-10-CM | POA: Diagnosis not present

## 2022-02-18 DIAGNOSIS — E785 Hyperlipidemia, unspecified: Secondary | ICD-10-CM | POA: Diagnosis not present

## 2022-02-18 DIAGNOSIS — E1169 Type 2 diabetes mellitus with other specified complication: Secondary | ICD-10-CM | POA: Diagnosis not present

## 2022-02-18 DIAGNOSIS — N390 Urinary tract infection, site not specified: Secondary | ICD-10-CM | POA: Diagnosis not present

## 2022-02-18 DIAGNOSIS — Z79899 Other long term (current) drug therapy: Secondary | ICD-10-CM | POA: Diagnosis not present

## 2022-02-18 DIAGNOSIS — K7469 Other cirrhosis of liver: Secondary | ICD-10-CM | POA: Diagnosis not present

## 2022-02-18 DIAGNOSIS — I480 Paroxysmal atrial fibrillation: Secondary | ICD-10-CM | POA: Diagnosis not present

## 2022-03-09 DIAGNOSIS — H25813 Combined forms of age-related cataract, bilateral: Secondary | ICD-10-CM | POA: Diagnosis not present

## 2022-03-30 DIAGNOSIS — N952 Postmenopausal atrophic vaginitis: Secondary | ICD-10-CM | POA: Diagnosis not present

## 2022-03-30 DIAGNOSIS — N3946 Mixed incontinence: Secondary | ICD-10-CM | POA: Diagnosis not present

## 2022-03-30 DIAGNOSIS — B3731 Acute candidiasis of vulva and vagina: Secondary | ICD-10-CM | POA: Diagnosis not present

## 2022-03-30 DIAGNOSIS — N200 Calculus of kidney: Secondary | ICD-10-CM | POA: Diagnosis not present

## 2022-03-30 DIAGNOSIS — N390 Urinary tract infection, site not specified: Secondary | ICD-10-CM | POA: Diagnosis not present

## 2022-03-30 DIAGNOSIS — R3129 Other microscopic hematuria: Secondary | ICD-10-CM | POA: Diagnosis not present

## 2022-04-11 DIAGNOSIS — N390 Urinary tract infection, site not specified: Secondary | ICD-10-CM | POA: Diagnosis not present

## 2022-04-11 DIAGNOSIS — R3129 Other microscopic hematuria: Secondary | ICD-10-CM | POA: Diagnosis not present

## 2022-04-11 DIAGNOSIS — N281 Cyst of kidney, acquired: Secondary | ICD-10-CM | POA: Diagnosis not present

## 2022-04-11 DIAGNOSIS — N200 Calculus of kidney: Secondary | ICD-10-CM | POA: Diagnosis not present

## 2022-04-28 DIAGNOSIS — R0981 Nasal congestion: Secondary | ICD-10-CM | POA: Diagnosis not present

## 2022-04-28 DIAGNOSIS — J209 Acute bronchitis, unspecified: Secondary | ICD-10-CM | POA: Diagnosis not present

## 2022-04-28 DIAGNOSIS — J0110 Acute frontal sinusitis, unspecified: Secondary | ICD-10-CM | POA: Diagnosis not present

## 2022-04-28 DIAGNOSIS — R059 Cough, unspecified: Secondary | ICD-10-CM | POA: Diagnosis not present

## 2022-05-20 DIAGNOSIS — M810 Age-related osteoporosis without current pathological fracture: Secondary | ICD-10-CM | POA: Diagnosis not present

## 2022-05-20 DIAGNOSIS — E785 Hyperlipidemia, unspecified: Secondary | ICD-10-CM | POA: Diagnosis not present

## 2022-05-20 DIAGNOSIS — Z79899 Other long term (current) drug therapy: Secondary | ICD-10-CM | POA: Diagnosis not present

## 2022-05-20 DIAGNOSIS — I872 Venous insufficiency (chronic) (peripheral): Secondary | ICD-10-CM | POA: Diagnosis not present

## 2022-05-20 DIAGNOSIS — I480 Paroxysmal atrial fibrillation: Secondary | ICD-10-CM | POA: Diagnosis not present

## 2022-05-20 DIAGNOSIS — E1169 Type 2 diabetes mellitus with other specified complication: Secondary | ICD-10-CM | POA: Diagnosis not present

## 2022-06-23 DIAGNOSIS — N3946 Mixed incontinence: Secondary | ICD-10-CM | POA: Diagnosis not present

## 2022-06-23 DIAGNOSIS — R3129 Other microscopic hematuria: Secondary | ICD-10-CM | POA: Diagnosis not present

## 2022-06-23 DIAGNOSIS — N952 Postmenopausal atrophic vaginitis: Secondary | ICD-10-CM | POA: Diagnosis not present

## 2022-06-23 DIAGNOSIS — N200 Calculus of kidney: Secondary | ICD-10-CM | POA: Diagnosis not present

## 2022-06-23 DIAGNOSIS — N390 Urinary tract infection, site not specified: Secondary | ICD-10-CM | POA: Diagnosis not present

## 2022-08-11 DIAGNOSIS — N952 Postmenopausal atrophic vaginitis: Secondary | ICD-10-CM | POA: Diagnosis not present

## 2022-08-11 DIAGNOSIS — I878 Other specified disorders of veins: Secondary | ICD-10-CM | POA: Diagnosis not present

## 2022-08-11 DIAGNOSIS — N390 Urinary tract infection, site not specified: Secondary | ICD-10-CM | POA: Diagnosis not present

## 2022-08-11 DIAGNOSIS — N200 Calculus of kidney: Secondary | ICD-10-CM | POA: Diagnosis not present

## 2022-08-11 DIAGNOSIS — N3946 Mixed incontinence: Secondary | ICD-10-CM | POA: Diagnosis not present

## 2022-08-22 DIAGNOSIS — Z79899 Other long term (current) drug therapy: Secondary | ICD-10-CM | POA: Diagnosis not present

## 2022-08-22 DIAGNOSIS — I872 Venous insufficiency (chronic) (peripheral): Secondary | ICD-10-CM | POA: Diagnosis not present

## 2022-08-22 DIAGNOSIS — M810 Age-related osteoporosis without current pathological fracture: Secondary | ICD-10-CM | POA: Diagnosis not present

## 2022-08-22 DIAGNOSIS — I480 Paroxysmal atrial fibrillation: Secondary | ICD-10-CM | POA: Diagnosis not present

## 2022-08-22 DIAGNOSIS — E785 Hyperlipidemia, unspecified: Secondary | ICD-10-CM | POA: Diagnosis not present

## 2022-08-22 DIAGNOSIS — E1169 Type 2 diabetes mellitus with other specified complication: Secondary | ICD-10-CM | POA: Diagnosis not present

## 2022-08-22 DIAGNOSIS — K7469 Other cirrhosis of liver: Secondary | ICD-10-CM | POA: Diagnosis not present

## 2022-08-26 DIAGNOSIS — N390 Urinary tract infection, site not specified: Secondary | ICD-10-CM | POA: Diagnosis not present

## 2022-09-29 DIAGNOSIS — J0100 Acute maxillary sinusitis, unspecified: Secondary | ICD-10-CM | POA: Diagnosis not present

## 2022-09-29 DIAGNOSIS — R0981 Nasal congestion: Secondary | ICD-10-CM | POA: Diagnosis not present

## 2022-09-29 DIAGNOSIS — R52 Pain, unspecified: Secondary | ICD-10-CM | POA: Diagnosis not present

## 2022-10-06 DIAGNOSIS — J324 Chronic pansinusitis: Secondary | ICD-10-CM | POA: Diagnosis not present

## 2022-10-06 DIAGNOSIS — R051 Acute cough: Secondary | ICD-10-CM | POA: Diagnosis not present

## 2022-10-06 DIAGNOSIS — R0981 Nasal congestion: Secondary | ICD-10-CM | POA: Diagnosis not present

## 2022-10-06 DIAGNOSIS — R509 Fever, unspecified: Secondary | ICD-10-CM | POA: Diagnosis not present

## 2022-11-03 ENCOUNTER — Telehealth: Payer: Self-pay | Admitting: Oncology

## 2022-11-03 NOTE — Telephone Encounter (Signed)
11/03/22 Spoke with patient and confirmed Dexa scan on 11/16/22@130pm -[facility].Mammogram on 11/17/22@1215pm -[facility]

## 2022-11-14 DIAGNOSIS — R3129 Other microscopic hematuria: Secondary | ICD-10-CM | POA: Diagnosis not present

## 2022-11-14 DIAGNOSIS — N200 Calculus of kidney: Secondary | ICD-10-CM | POA: Diagnosis not present

## 2022-11-14 DIAGNOSIS — N952 Postmenopausal atrophic vaginitis: Secondary | ICD-10-CM | POA: Diagnosis not present

## 2022-11-14 DIAGNOSIS — N3946 Mixed incontinence: Secondary | ICD-10-CM | POA: Diagnosis not present

## 2022-11-14 DIAGNOSIS — N390 Urinary tract infection, site not specified: Secondary | ICD-10-CM | POA: Diagnosis not present

## 2022-11-16 DIAGNOSIS — M8589 Other specified disorders of bone density and structure, multiple sites: Secondary | ICD-10-CM | POA: Diagnosis not present

## 2022-11-16 DIAGNOSIS — M810 Age-related osteoporosis without current pathological fracture: Secondary | ICD-10-CM | POA: Diagnosis not present

## 2022-11-16 LAB — HM DEXA SCAN

## 2022-11-17 DIAGNOSIS — Z1231 Encounter for screening mammogram for malignant neoplasm of breast: Secondary | ICD-10-CM | POA: Diagnosis not present

## 2022-11-17 LAB — HM MAMMOGRAPHY

## 2022-11-24 ENCOUNTER — Ambulatory Visit: Payer: PPO | Admitting: Oncology

## 2022-12-01 NOTE — Progress Notes (Signed)
[facility]  [address] [place],  [place]  [address] [phone]  Clinic Day:  12/02/22  Referring physician: Paulina Fusi, MD  CHIEF COMPLAINT:  CC: History of stage IIA hormone receptor and HER2 positive left breast cancer  Current Treatment:  Surveillance  HISTORY OF PRESENT ILLNESS:  Colleen Galloway is a 88 y.o. female with a history of stage IIA (T2 N0 M0) hormone and HER 2 Neu receptor positive left breast cancer diagnosed in August 2015.  She was treated with lumpectomy.  Pathology revealed a 2.4 cm, grade 3, invasive ductal carcinoma with negative sentinel node.  Estrogen and progesterone receptors were positive and HER 2 positive.  Ki 67 was 78%.  She was given adjuvant chemotherapy with weekly Abraxane and trastuzumab for 12 weeks, which was completed at the end of December 2015.  She was referred to [place] in January 2016 to complete her trastuzumab, which finished in October 2016.  She received adjuvant radiation to the left breast, completed in March 2016.  She was placed on hormonal therapy with anastrozole 1 mg daily in April 2016. She has had some mild elevation of the liver transaminases.  She had osteoporosis on bone density scan in January 2016, with a T-score of -2.8 in the femur and a T-score of -2.3 in the forearm.  She was placed on Prolia in May 2016, but then declined further Prolia, as she felt she had too many side effects.  We recommended treatment for the osteoporosis, as anastrozole has the potential to worsen this. She continues calcium and vitamin-D twice daily.  She has occasional episodes of atrial fibrillation and was found to be in atrial fibrillation in early October and saw Dr. Graciela Husbands, her cardiologist.  She was found to have pulmonary artery hypertension. She complains of a pinched nerve in her lumbar spine and slight scoliosis, and rates her back and hip pain at a 6/10. She saw Dr. Jennye Boroughs and had upper endoscopy, where  she was found to have a polyp of her stomach which was removed.  Dr. Tomasa Blase does her labs on a regular basis.  She also had a bone density scan done in January of 2018, and this showed osteoporosis but the spine had improved by 2.5% and the femur by 3%.  Her T-score of the femur was still -2.5.  She has had her shingles vaccine. She completed 5 years of anastrozole in May 2021. Bone density scan from April 2021 revealed osteoporosis with a T-score of -3.1 of the left forearm radius, previously -2.5.  The right femur neck measures -2.7, previously -2.5, and the dual femur total mean is stable at -2.2.   INTERVAL HISTORY:  Colleen Galloway is here for annual follow up for her history of stage IIA hormone receptor and HER2 positive left breast cancer. Patient states that she feels well but complains of soreness in her left breast. She informed me that her sister recently passed in August and she is understandably grieving. She had a screening mammogram done on 11/17/2022 that showed stable left lumpectomy and no evidence of malignancy. She does have dense breasts and a significant seroma in the left central breast which is stable. She continues Xarelto without difficulty. Patient informed me that she was found to have kidney stones and a cyst in the right ovary and kidney. Her PCP does routine labs and she will have them done on 12/05/2022. I will see her back in 1 year for reevaluation.  She denies  signs of infection such as sore throat, sinus drainage, cough, or urinary symptoms.  She denies fevers or recurrent chills. She denies pain. She denies nausea, vomiting, chest pain, dyspnea or cough. Her appetite is very good and her weight has decreased 15 pounds over last year .  REVIEW OF SYSTEMS:  Review of Systems  Constitutional: Negative.  Negative for appetite change, chills, diaphoresis, fatigue, fever and unexpected weight change.  HENT:  Negative.  Negative for hearing loss, lump/mass, mouth sores, nosebleeds, sore  throat, tinnitus, trouble swallowing and voice change.   Eyes: Negative.  Negative for eye problems and icterus.  Respiratory: Negative.  Negative for chest tightness, cough, hemoptysis, shortness of breath and wheezing.   Cardiovascular: Negative.  Negative for chest pain, leg swelling and palpitations.  Gastrointestinal: Negative.  Negative for abdominal distention, abdominal pain, blood in stool, constipation, diarrhea, nausea, rectal pain and vomiting.  Endocrine: Negative.   Genitourinary: Negative.  Negative for bladder incontinence, difficulty urinating, dyspareunia, dysuria, frequency, hematuria, menstrual problem, nocturia, pelvic pain, vaginal bleeding and vaginal discharge.   Musculoskeletal: Negative.  Negative for arthralgias, back pain, flank pain, gait problem, myalgias, neck pain and neck stiffness.       Soreness in the left breast  Skin: Negative.  Negative for itching, rash and wound.  Neurological: Negative.  Negative for dizziness, extremity weakness, gait problem, headaches, light-headedness, numbness, seizures and speech difficulty.  Hematological:  Negative for adenopathy. Bruises/bleeds easily.  Psychiatric/Behavioral: Negative.  Negative for confusion, decreased concentration, depression, sleep disturbance and suicidal ideas. The patient is not nervous/anxious.      VITALS:  Blood pressure (!) 145/66, pulse 67, temperature 97.7 F (36.5 C), temperature source Oral, resp. rate 18, height 5\' 4"  (1.626 m), weight 157 lb 9.6 oz (71.5 kg), SpO2 98%.  Wt Readings from Last 3 Encounters:  12/21/22 158 lb 6.4 oz (71.8 kg)  12/02/22 157 lb 9.6 oz (71.5 kg)  12/08/21 172 lb 12.8 oz (78.4 kg)    Body mass index is 27.05 kg/m.  Performance status (ECOG): 1 - Symptomatic but completely ambulatory  PHYSICAL EXAM:  Physical Exam Vitals and nursing note reviewed.  Constitutional:      General: She is not in acute distress.    Appearance: Normal appearance. She is normal  weight. She is not ill-appearing, toxic-appearing or diaphoretic.  HENT:     Head: Normocephalic and atraumatic.     Right Ear: Tympanic membrane, ear canal and external ear normal. There is no impacted cerumen.     Left Ear: Tympanic membrane, ear canal and external ear normal. There is no impacted cerumen.     Nose: No congestion or rhinorrhea.     Mouth/Throat:     Mouth: Mucous membranes are moist.     Pharynx: Oropharynx is clear. No oropharyngeal exudate or posterior oropharyngeal erythema.  Eyes:     General: No scleral icterus.       Right eye: No discharge.        Left eye: No discharge.     Extraocular Movements: Extraocular movements intact.     Conjunctiva/sclera: Conjunctivae normal.     Pupils: Pupils are equal, round, and reactive to light.  Neck:     Vascular: No carotid bruit.  Cardiovascular:     Rate and Rhythm: Normal rate and regular rhythm.     Pulses: Normal pulses.     Heart sounds: Normal heart sounds. No murmur heard.    No friction rub. No gallop.  Pulmonary:  Effort: Pulmonary effort is normal. No respiratory distress.     Breath sounds: Normal breath sounds. No stridor. No wheezing, rhonchi or rales.  Chest:     Chest wall: No tenderness.     Comments: Large area of firmness in the upper outer quadrant of the left breast measuring 4cm across consistent with seroma.  No masses in either breast Abdominal:     General: Bowel sounds are normal. There is no distension.     Palpations: Abdomen is soft. There is no hepatomegaly, splenomegaly or mass.     Tenderness: There is no abdominal tenderness. There is no right CVA tenderness, left CVA tenderness, guarding or rebound.     Hernia: No hernia is present.  Musculoskeletal:        General: No swelling, tenderness, deformity or signs of injury. Normal range of motion.     Cervical back: Normal range of motion and neck supple. No rigidity or tenderness.     Right lower leg: No edema.     Left lower leg:  No edema.  Lymphadenopathy:     Cervical: No cervical adenopathy.     Right cervical: No superficial, deep or posterior cervical adenopathy.    Left cervical: No superficial, deep or posterior cervical adenopathy.     Upper Body:     Right upper body: No supraclavicular, axillary or pectoral adenopathy.     Left upper body: No supraclavicular, axillary or pectoral adenopathy.  Skin:    General: Skin is warm and dry.     Coloration: Skin is not jaundiced or pale.     Findings: No bruising, erythema, lesion or rash.  Neurological:     General: No focal deficit present.     Mental Status: She is alert and oriented to person, place, and time. Mental status is at baseline.     Cranial Nerves: No cranial nerve deficit.     Sensory: No sensory deficit.     Motor: No weakness.     Coordination: Coordination normal.     Gait: Gait normal.     Deep Tendon Reflexes: Reflexes normal.  Psychiatric:        Mood and Affect: Mood normal.        Behavior: Behavior normal.        Thought Content: Thought content normal.        Judgment: Judgment normal.     LABS:      Latest Ref Rng & Units 03/24/2021    2:12 PM 08/06/2019   12:00 AM 03/14/2017    3:34 PM  CBC  WBC 3.4 - 10.8 x10E3/uL 5.3  5.5  8.5   Hemoglobin 11.1 - 15.9 g/dL 25.3  66.4  40.3   Hematocrit 34.0 - 46.6 % 40.0  41  43.3   Platelets 150 - 450 x10E3/uL 197  191  285       Latest Ref Rng & Units 03/24/2021    2:08 PM 08/06/2019   12:00 AM 10/31/2017    2:48 PM  CMP  Glucose 70 - 99 mg/dL 474   259   BUN 8 - 27 mg/dL 16  13  14    Creatinine 0.57 - 1.00 mg/dL 5.63  1.0  8.75   Sodium 134 - 144 mmol/L 141  139  142   Potassium 3.5 - 5.2 mmol/L 4.4  4.7  3.7   Chloride 96 - 106 mmol/L 102  104  104   CO2 20 - 29 mmol/L 21  24  22   Calcium 8.7 - 10.3 mg/dL 82.9  9.8  9.8   Alkaline Phos 25 - 125  87    AST 13 - 35  39    ALT 7 - 35  29      STUDIES:  Exam: 11/17/2022 3D Mammogram Screening  Digital W/CAD   Impression: Stable left lumpectomy.  No mammographic evidence of malignancy.    HISTORY:   Allergies:  Allergies  Allergen Reactions   Flecainide Shortness Of Breath, Swelling and Other (See Comments)    Sob, visual problems, swelling ankles   Nebivolol Shortness Of Breath and Swelling    Sob, visual problems, swelling in ankles   Diltiazem Hcl Swelling    Pt states it causes her ankle swelling    Lopressor [Metoprolol Tartrate]     Severe dizziness. Irregular heartbeat    Sodium Pantothenate Nausea And Vomiting    swelling   Tape Rash    Current Medications: Current Outpatient Medications  Medication Sig Dispense Refill   calcium citrate-vitamin D (CITRACAL+D) 315-200 MG-UNIT per tablet Take 1 tablet by mouth daily.      conjugated estrogens (PREMARIN) vaginal cream Place 1 applicator vaginally at bedtime. 3 times weekly only     doxycycline (VIBRAMYCIN) 50 MG capsule Take 50 mg by mouth daily as needed.     fenofibrate 160 MG tablet Take 160 mg by mouth daily after supper.      furosemide (LASIX) 20 MG tablet Take 1 tablet (20 mg total) by mouth daily. (Patient taking differently: Take 20 mg by mouth as needed.) 90 tablet 3   meclizine (ANTIVERT) 25 MG tablet Take 1 tablet (25 mg total) by mouth 3 (three) times daily as needed for dizziness. 90 tablet 2   metFORMIN (GLUCOPHAGE) 500 MG tablet Take 500 mg by mouth daily.     Methenamine-Sodium Salicylate (AZO URINARY TRACT DEFENSE PO) Take by mouth. As needed     metoprolol succinate (TOPROL-XL) 25 MG 24 hr tablet Take 75 mg by mouth daily.     Multiple Vitamin (MULTIVITAMIN) capsule Take 1 capsule by mouth daily.       omeprazole (PRILOSEC) 20 MG capsule Take 20 mg by mouth daily.       Rivaroxaban (XARELTO) 15 MG TABS tablet Take 1 tablet (15 mg total) by mouth daily with supper. 90 tablet 3   Vaginal Lubricant (REPLENS VA) Place vaginally as needed.     Rivaroxaban (XARELTO) 15 MG TABS tablet Take 1 tablet (15 mg total)  by mouth daily with supper. 90 tablet 3   No current facility-administered medications for this visit.     ASSESSMENT & PLAN:  Assessment/Plan:   History of stage IIA left breast cancer diagnosed in August 2015, over 8 years ago which was hormone positive and HER 2 positive.  She was treated with surgery, chemotherapy, HER 2 directed therapy, adjuvant radiation and hormonal therapy.  She completed 5 years of anastrozole in May 2021.   Osteoporosis. Her bone density scan shows some worsening but she is now off the hormonal therapy.  She was due for repeat bone density in April 2023, bur prefers to wait until next year.  Plan: She had a screening mammogram done on 11/17/2022 that showed stable left lumpectomy and no evidence of malignancy. She does have dense breasts and a significant seroma in the left central breast which is stable. She continues Xarelto without difficulty. Patient informed me that she was found to have kidney stones and a cyst  in the right ovary and kidney. Her PCP does routine labs and she will have them done on 12/05/2022. I will see her back in 1 year for reevaluation. She understands and agrees with this plan of care.  I provided 16 minutes of face-to-face time during this this encounter and > 50% was spent counseling as documented under my assessment and plan.    Dellia Beckwith, MD [facility] AT [facility] [address] [place] [address] Dept: [phone] Dept Fax: [phone]    Rulon Sera Lassiter,acting as a scribe for Dellia Beckwith, MD.,have documented all relevant documentation on the behalf of Dellia Beckwith, MD,as directed by  Dellia Beckwith, MD while in the presence of Dellia Beckwith, MD.  I have reviewed this report as typed by the medical scribe, and it is complete and accurate.  Dellia Beckwith, MD

## 2022-12-02 ENCOUNTER — Encounter: Payer: Self-pay | Admitting: Oncology

## 2022-12-02 ENCOUNTER — Inpatient Hospital Stay: Payer: PPO | Attending: Oncology | Admitting: Oncology

## 2022-12-02 VITALS — BP 145/66 | HR 67 | Temp 97.7°F | Resp 18 | Ht 64.0 in | Wt 157.6 lb

## 2022-12-02 DIAGNOSIS — Z79899 Other long term (current) drug therapy: Secondary | ICD-10-CM | POA: Insufficient documentation

## 2022-12-02 DIAGNOSIS — C50512 Malignant neoplasm of lower-outer quadrant of left female breast: Secondary | ICD-10-CM | POA: Diagnosis not present

## 2022-12-02 DIAGNOSIS — M810 Age-related osteoporosis without current pathological fracture: Secondary | ICD-10-CM | POA: Insufficient documentation

## 2022-12-02 DIAGNOSIS — R9230 Dense breasts, unspecified: Secondary | ICD-10-CM | POA: Diagnosis not present

## 2022-12-02 DIAGNOSIS — Z923 Personal history of irradiation: Secondary | ICD-10-CM | POA: Diagnosis not present

## 2022-12-02 DIAGNOSIS — Z170 Estrogen receptor positive status [ER+]: Secondary | ICD-10-CM

## 2022-12-02 DIAGNOSIS — Z9221 Personal history of antineoplastic chemotherapy: Secondary | ICD-10-CM | POA: Diagnosis not present

## 2022-12-02 DIAGNOSIS — Z853 Personal history of malignant neoplasm of breast: Secondary | ICD-10-CM | POA: Diagnosis not present

## 2022-12-02 DIAGNOSIS — Z7901 Long term (current) use of anticoagulants: Secondary | ICD-10-CM | POA: Insufficient documentation

## 2022-12-05 DIAGNOSIS — E785 Hyperlipidemia, unspecified: Secondary | ICD-10-CM | POA: Diagnosis not present

## 2022-12-05 DIAGNOSIS — Z23 Encounter for immunization: Secondary | ICD-10-CM | POA: Diagnosis not present

## 2022-12-05 DIAGNOSIS — M810 Age-related osteoporosis without current pathological fracture: Secondary | ICD-10-CM | POA: Diagnosis not present

## 2022-12-05 DIAGNOSIS — K7469 Other cirrhosis of liver: Secondary | ICD-10-CM | POA: Diagnosis not present

## 2022-12-05 DIAGNOSIS — I872 Venous insufficiency (chronic) (peripheral): Secondary | ICD-10-CM | POA: Diagnosis not present

## 2022-12-05 DIAGNOSIS — E1169 Type 2 diabetes mellitus with other specified complication: Secondary | ICD-10-CM | POA: Diagnosis not present

## 2022-12-05 DIAGNOSIS — I480 Paroxysmal atrial fibrillation: Secondary | ICD-10-CM | POA: Diagnosis not present

## 2022-12-06 ENCOUNTER — Telehealth: Payer: Self-pay | Admitting: Oncology

## 2022-12-06 LAB — LAB REPORT - SCANNED
A1c: 6.6
Albumin, Urine POC: 18.1
Creatinine, POC: 66.6 mg/dL
EGFR: 47
Microalb Creat Ratio: 27

## 2022-12-06 NOTE — Telephone Encounter (Signed)
Contacted pt to schedule an appt. Unable to reach via phone, voicemail was left.  Scheduling Message Entered by Gery Pray H on 12/02/2022 at  4:05 PM Priority: Routine <No visit type provided>  Department: [facility]  Provider:  Scheduling Notes:  RT 1 year with bilat mammogram at [facility] - she has already scheduled mammo for next year, do you still need order?

## 2022-12-15 ENCOUNTER — Encounter: Payer: Self-pay | Admitting: Oncology

## 2022-12-17 ENCOUNTER — Telehealth: Payer: Self-pay | Admitting: Cardiology

## 2022-12-17 MED ORDER — RIVAROXABAN 15 MG PO TABS: 15.0000 mg | ORAL_TABLET | Freq: Every day | ORAL | 3 refills | Status: DC

## 2022-12-17 NOTE — Telephone Encounter (Signed)
Outpatient service line:  CC: Xarelto refill  Pharmacy from [facility] asking for refill, patient currently there now. Will refill 15mg  tablets.

## 2022-12-20 NOTE — Progress Notes (Unsigned)
  Electrophysiology Office Note:   Date:  12/21/2022  ID:  Colleen Galloway, DOB Aug 09, 1934, MRN [id number]  Primary Cardiologist: None Electrophysiologist: Sherryl Manges, MD      History of Present Illness:   Colleen Galloway is a 88 y.o. female with h/o atrial fibrillation, atrial flutter, HTN, HFpEF, CKD III, and pulmonary HTN seen today for routine electrophysiology followup.   Since last being seen in our clinic the patient reports doing well from a cardiac perspective. Dealing with grief from the loss of her sister in August, rather suddenly after a diagnosis of Ovarian CA in July. Pt does have some balance issues, but not walking with cane. Mild SOB with moderate exertion, but only on occasion. Otherwise,  she denies chest pain, palpitations, PND, orthopnea, nausea, vomiting, dizziness, syncope, edema, weight gain, or early satiety.   Review of systems complete and found to be negative unless listed in HPI.   EP Information / Studies Reviewed:    EKG is ordered today. Personal review as below.  EKG Interpretation Date/Time:  Wednesday December 21 2022 10:34:27 EST Ventricular Rate:  65 PR Interval:    QRS Duration:  76 QT Interval:  428 QTC Calculation: 445 R Axis:   -21  Text Interpretation: Atrial fibrillation Nonspecific ST and T wave abnormality Confirmed by Maxine Glenn [id number]) on 12/21/2022 10:39:40 AM    Echo 12/2021 LVEF 60-65%, mild LVH, normal RV, severe biatrial enlargement  Physical Exam:   VS:  BP 124/64 (BP Location: Left Arm, Patient Position: Sitting, Cuff Size: Normal)   Pulse 65   Ht 5\' 4"  (1.626 m)   Wt 158 lb 6.4 oz (71.8 kg)   SpO2 96%   BMI 27.19 kg/m    Wt Readings from Last 3 Encounters:  12/21/22 158 lb 6.4 oz (71.8 kg)  12/02/22 157 lb 9.6 oz (71.5 kg)  12/08/21 172 lb 12.8 oz (78.4 kg)     GEN: Well nourished, well developed in no acute distress NECK: No JVD; No carotid bruits CARDIAC: Irregularly irregular rate and rhythm, no murmurs,  rubs, gallops RESPIRATORY:  Clear to auscultation without rales, wheezing or rhonchi  ABDOMEN: Soft, non-tender, non-distended EXTREMITIES:  No edema; No deformity   ASSESSMENT AND PLAN:    Atrial fibrillation, persistent Atrial flutter EKG today shows rate controlled AF Continue Xarelto 15 mg daily, correct dose by CrCl Continue toprol 75 mg daily.  Attempts at rhythm control are likely to be low yield at this point  HTN Stable on current regimen  CKD III Labs today.  Follow up with Dr. Graciela Husbands in 12 months  Signed, Graciella Freer, PA-C

## 2022-12-21 ENCOUNTER — Ambulatory Visit: Payer: PPO | Attending: Student | Admitting: Student

## 2022-12-21 ENCOUNTER — Encounter: Payer: Self-pay | Admitting: Student

## 2022-12-21 VITALS — BP 124/64 | HR 65 | Ht 64.0 in | Wt 158.4 lb

## 2022-12-21 DIAGNOSIS — I10 Essential (primary) hypertension: Secondary | ICD-10-CM | POA: Diagnosis not present

## 2022-12-21 DIAGNOSIS — I5032 Chronic diastolic (congestive) heart failure: Secondary | ICD-10-CM

## 2022-12-21 DIAGNOSIS — I4819 Other persistent atrial fibrillation: Secondary | ICD-10-CM

## 2022-12-21 NOTE — Patient Instructions (Signed)
Medication Instructions:  Your physician recommends that you continue on your current medications as directed. Please refer to the Current Medication list given to you today.  *If you need a refill on your cardiac medications before your next appointment, please call your pharmacy*  Lab Work: None ordered If you have labs (blood work) drawn today and your tests are completely normal, you will receive your results only by: MyChart Message (if you have MyChart) OR A paper copy in the mail If you have any lab test that is abnormal or we need to change your treatment, we will call you to review the results.  Follow-Up: At [facility], you and your health needs are our priority.  As part of our continuing mission to provide you with exceptional heart care, we have created designated Provider Care Teams.  These Care Teams include your primary Cardiologist (physician) and Advanced Practice Providers (APPs -  Physician Assistants and Nurse Practitioners) who all work together to provide you with the care you need, when you need it.  We recommend signing up for the patient portal called "MyChart".  Sign up information is provided on this After Visit Summary.  MyChart is used to connect with patients for Virtual Visits (Telemedicine).  Patients are able to view lab/test results, encounter notes, upcoming appointments, etc.  Non-urgent messages can be sent to your provider as well.   To learn more about what you can do with MyChart, go to [email or web address].    Your next appointment:   1 year(s)  Provider:   Sherryl Manges, MD

## 2023-03-13 DIAGNOSIS — E785 Hyperlipidemia, unspecified: Secondary | ICD-10-CM | POA: Diagnosis not present

## 2023-03-13 DIAGNOSIS — M810 Age-related osteoporosis without current pathological fracture: Secondary | ICD-10-CM | POA: Diagnosis not present

## 2023-03-13 DIAGNOSIS — E1169 Type 2 diabetes mellitus with other specified complication: Secondary | ICD-10-CM | POA: Diagnosis not present

## 2023-03-13 DIAGNOSIS — I480 Paroxysmal atrial fibrillation: Secondary | ICD-10-CM | POA: Diagnosis not present

## 2023-03-13 DIAGNOSIS — J3489 Other specified disorders of nose and nasal sinuses: Secondary | ICD-10-CM | POA: Diagnosis not present

## 2023-03-13 DIAGNOSIS — K7469 Other cirrhosis of liver: Secondary | ICD-10-CM | POA: Diagnosis not present

## 2023-03-13 DIAGNOSIS — I872 Venous insufficiency (chronic) (peripheral): Secondary | ICD-10-CM | POA: Diagnosis not present

## 2023-03-15 DIAGNOSIS — H25813 Combined forms of age-related cataract, bilateral: Secondary | ICD-10-CM | POA: Diagnosis not present

## 2023-03-31 ENCOUNTER — Other Ambulatory Visit (HOSPITAL_BASED_OUTPATIENT_CLINIC_OR_DEPARTMENT_OTHER): Payer: Self-pay

## 2023-03-31 ENCOUNTER — Ambulatory Visit (HOSPITAL_BASED_OUTPATIENT_CLINIC_OR_DEPARTMENT_OTHER)
Admission: EM | Admit: 2023-03-31 | Discharge: 2023-03-31 | Disposition: A | Discharge status: Home / Self Care | Payer: PPO | Attending: Emergency Medicine | Admitting: Emergency Medicine

## 2023-03-31 ENCOUNTER — Encounter (HOSPITAL_BASED_OUTPATIENT_CLINIC_OR_DEPARTMENT_OTHER): Payer: Self-pay

## 2023-03-31 DIAGNOSIS — B349 Viral infection, unspecified: Secondary | ICD-10-CM | POA: Insufficient documentation

## 2023-03-31 DIAGNOSIS — R300 Dysuria: Secondary | ICD-10-CM | POA: Insufficient documentation

## 2023-03-31 DIAGNOSIS — I4819 Other persistent atrial fibrillation: Secondary | ICD-10-CM | POA: Diagnosis not present

## 2023-03-31 DIAGNOSIS — I5032 Chronic diastolic (congestive) heart failure: Secondary | ICD-10-CM | POA: Diagnosis not present

## 2023-03-31 LAB — POCT URINALYSIS DIP (MANUAL ENTRY)
Bilirubin, UA: NEGATIVE
Glucose, UA: NEGATIVE mg/dL
Ketones, POC UA: NEGATIVE mg/dL
Nitrite, UA: NEGATIVE
Protein Ur, POC: NEGATIVE mg/dL
Spec Grav, UA: 1.015
Urobilinogen, UA: 0.2 U/dL
pH, UA: 5.5

## 2023-03-31 MED ORDER — CETIRIZINE HCL 10 MG PO TABS
5.0000 mg | ORAL_TABLET | Freq: Every evening | ORAL | 0 refills | Status: AC
  Filled 2023-03-31: qty 15, 30d supply, fill #0

## 2023-03-31 NOTE — ED Triage Notes (Signed)
Productive cough, nasal drainage. Onset 2 weeks ago. Had a fever when initially seen by her physician. Has been taking iprotropium bromide nasal spray. "It doesn't seem as if it's working anymore."

## 2023-03-31 NOTE — Discharge Instructions (Addendum)
Zyrtec -- one 5 mg tablet nightly This will help dry up runny nose  Guaifenesin -- two 400 mg tablets every 4 hours This is for congestion Please get this over-the-counter at our pharmacy Take with LOTS of fluids!  If 5-6 days from now you are feeling worse, please return

## 2023-03-31 NOTE — ED Provider Notes (Signed)
[facility]    CSN: [id number] Arrival date & time: 03/31/23  1253      History   Chief Complaint Chief Complaint  Patient presents with   Cough   chest congestion    HPI Colleen Galloway is a 89 y.o. female.  5-6 day history of congestion and cough At onset she had runny nose and chills, everything has seemed to improve including cough is almost gone.  She is most worried about the nasal congestion.  No recent fevers Not having shortness of breath or wheezing  History A-fib, CHF  Only medicine she has tried is ipratropium nasal spray.  Nothing else  Past Medical History:  Diagnosis Date   Allergy    Arthritis    Breast cancer (HCC) 09/17/13   Left iNVASIVE DUCTAL,dcis   Calculus of kidney    kidney stones   Carotid artery disease (HCC)    Followed by Dr. Edilia Bo   CHF (congestive heart failure) (HCC)    Esophageal reflux    Insomnia, unspecified    Lumbosacral spondylosis without myelopathy    Orthostatic lightheadedness 10/20/2011   Osteoarthrosis, unspecified whether generalized or localized, unspecified site    Persistent atrial fibrillation (HCC)    PONV (postoperative nausea and vomiting)     Patient Active Problem List   Diagnosis Date Noted    Afib/flutter - persistant RVR 12/03/2021   Osteoporosis 11/06/2017    Class: Chronic   Family history of colon cancer    Family history of rectal cancer    Breast cancer of lower-outer quadrant of left female breast (HCC) 09/26/2013   Dizziness 01/15/2013   Chronic diastolic heart failure (HCC) 10/20/2011   Orthostatic lightheadedness 10/20/2011   Occlusion and stenosis of carotid artery without mention of cerebral infarction 05/25/2011   PAF (paroxysmal atrial fibrillation) (HCC) 11/24/2010   Carotid bruit 10/20/2010   Mixed hyperlipidemia 10/20/2010    Past Surgical History:  Procedure Laterality Date   BREAST LUMPECTOMY WITH NEEDLE LOCALIZATION AND AXILLARY SENTINEL LYMPH NODE BX Left 10/18/2013    Procedure: LEFT AXILLARY LYMPHATIC MAPPING; INJECTION OF METHYLENE BLUE INTO LEFT BREAST; LEFT BREAST PARTIAL MASTECTOMY AFTER NEEDLE LOCALIZATION; AXILLARY SENTINEL LYMPH NODE BIOPSY;  Surgeon: Avel Peace, MD;  Location: [facility] OR;  Service: General;  Laterality: Left;   DILATION AND CURETTAGE OF UTERUS  1960   KIDNEY STONE SURGERY     PORT-A-CATH REMOVAL Right 12/26/2014   Procedure: REMOVAL PORT-A-CATH;  Surgeon: Avel Peace, MD;  Location: [facility];  Service: General;  Laterality: Right;   PORTACATH PLACEMENT N/A 10/18/2013   Procedure: INSERTION PORT-A-CATH/ULTRASOUND GUIDED,RIGHT INTERNAL JUGULAR;  Surgeon: Avel Peace, MD;  Location: [facility] OR;  Service: General;  Laterality: N/A;   varicose veins      OB History   No obstetric history on file.      Home Medications    Prior to Admission medications   Medication Sig Start Date End Date Taking? Authorizing Provider  cetirizine (ZYRTEC) 10 MG tablet Take 0.5 tablets (5 mg total) by mouth at bedtime. 03/31/23  Yes Kortne All, Lurena Joiner, PA-C  calcium citrate-vitamin D (CITRACAL+D) 315-200 MG-UNIT per tablet Take 1 tablet by mouth daily.     [provider]  conjugated estrogens (PREMARIN) vaginal cream Place 1 applicator vaginally at bedtime. 3 times weekly only    [provider]  doxycycline (VIBRAMYCIN) 50 MG capsule Take 50 mg by mouth daily as needed.    [provider]  fenofibrate 160 MG tablet Take  160 mg by mouth daily after supper.     [provider]  furosemide (LASIX) 20 MG tablet Take 1 tablet (20 mg total) by mouth daily. Patient taking differently: Take 20 mg by mouth as needed. 10/31/17   Duke Salvia, MD  meclizine (ANTIVERT) 25 MG tablet Take 1 tablet (25 mg total) by mouth 3 (three) times daily as needed for dizziness. 10/02/20   Duke Salvia, MD  metFORMIN (GLUCOPHAGE) 500 MG tablet Take 500 mg by mouth daily. 09/15/22   [provider]   Methenamine-Sodium Salicylate (AZO URINARY TRACT DEFENSE PO) Take by mouth. As needed    [provider]  metoprolol succinate (TOPROL-XL) 25 MG 24 hr tablet Take 75 mg by mouth daily.    [provider]  Multiple Vitamin (MULTIVITAMIN) capsule Take 1 capsule by mouth daily.      [provider]  omeprazole (PRILOSEC) 20 MG capsule Take 20 mg by mouth daily.      [provider]  Rivaroxaban (XARELTO) 15 MG TABS tablet Take 1 tablet (15 mg total) by mouth daily with supper. 12/08/21   Duke Salvia, MD  Rivaroxaban (XARELTO) 15 MG TABS tablet Take 1 tablet (15 mg total) by mouth daily with supper. 12/17/22   Abagail Kitchens, PA-C  Vaginal Lubricant (REPLENS VA) Place vaginally as needed.    [provider]    Family History Family History  Problem Relation Age of Onset   Heart disease Mother    Asthma Mother    Hypertension Mother    Deep vein thrombosis Mother    Other Mother        varicose veins   Lung cancer Mother 31       non-smoker   Heart disease Father    Hypertension Father    Stroke Father    Heart disease Sister    Hypertension Sister    Hyperlipidemia Sister    Rectal cancer Sister 73   Heart disease Brother    Asthma Brother    Hypertension Brother    Deep vein thrombosis Brother    Diabetes Brother    Colon cancer Other 63       maternal cousin's son; NOS   Colon cancer Maternal Grandfather 24   Liver disease Brother 39       Cirrhosis due to infection from gallbladder   Melanoma Other     Social History Social History   Tobacco Use   Smoking status: Never   Smokeless tobacco: Never  Vaping Use   Vaping status: Never Used  Substance Use Topics   Alcohol use: No   Drug use: No     Allergies   Flecainide, Nebivolol, Diltiazem hcl, Lopressor [metoprolol tartrate], Sodium pantothenate, and Tape   Review of Systems Review of Systems Per HPI  Physical Exam Triage Vital Signs ED Triage Vitals   Encounter Vitals Group     BP 03/31/23 1314 (!) 146/84     Systolic BP Percentile --      Diastolic BP Percentile --      Pulse Rate 03/31/23 1314 84     Resp 03/31/23 1314 20     Temp 03/31/23 1314 98.5 F (36.9 C)     Temp Source 03/31/23 1314 Oral     SpO2 03/31/23 1314 96 %     Weight --      Height --      Head Circumference --      Peak Flow --  Pain Score 03/31/23 1315 0     Pain Loc --      Pain Education --      Exclude from Growth Chart --    No data found.  Updated Vital Signs BP (!) 146/84 (BP Location: Right Arm)   Pulse 84   Temp 98.5 F (36.9 C) (Oral)   Resp 20   SpO2 96%    Physical Exam Vitals and nursing note reviewed.  Constitutional:      General: She is not in acute distress.    Appearance: She is not ill-appearing.  HENT:     Right Ear: Tympanic membrane and ear canal normal.     Left Ear: Tympanic membrane and ear canal normal.     Nose: No congestion or rhinorrhea.     Mouth/Throat:     Mouth: Mucous membranes are moist.     Pharynx: Oropharynx is clear. No posterior oropharyngeal erythema.  Eyes:     Conjunctiva/sclera: Conjunctivae normal.  Cardiovascular:     Rate and Rhythm: Normal rate and regular rhythm.     Pulses: Normal pulses.     Heart sounds: Normal heart sounds.  Pulmonary:     Effort: Pulmonary effort is normal. No respiratory distress.     Breath sounds: No wheezing, rhonchi or rales.  Abdominal:     Palpations: Abdomen is soft.     Tenderness: There is no abdominal tenderness.  Musculoskeletal:     Cervical back: Normal range of motion.  Lymphadenopathy:     Cervical: No cervical adenopathy.  Skin:    General: Skin is warm and dry.  Neurological:     Mental Status: She is alert and oriented to person, place, and time.     UC Treatments / Results  Labs (all labs ordered are listed, but only abnormal results are displayed) Labs Reviewed  POCT URINALYSIS DIP (MANUAL ENTRY) - Abnormal; Notable for the  following components:      Result Value   Clarity, UA cloudy (*)    Blood, UA small (*)    Leukocytes, UA Small (1+) (*)    All other components within normal limits  URINE CULTURE    EKG  Radiology No results found.  Procedures Procedures   Medications Ordered in UC Medications - No data to display  Initial Impression / Assessment and Plan / UC Course  I have reviewed the triage vital signs and the nursing notes.  Pertinent labs & imaging results that were available during my care of the patient were reviewed by me and considered in my medical decision making (see chart for details).  Afebrile in clinic, well-appearing, clear lungs 5 or 6 days of symptoms with improvement, still with lingering congestion  Discussed with patient likely viral etiology, no indication for antibiotics at this time.  Recommend symptomatic care.  Try guaifenesin and  5 mg Zyrtec at nighttime.  Advise increase fluids.  Can return if symptoms seem to progress over the next 5 to 6 days.  Patient is agreeable to plan, all questions answered    After discharge, patient reports she would like her urine tested She is not having urinary symptoms. She is followed by urology  Small leuks and RBC - will culture. Advised contact urology if having concerns    Final Clinical Impressions(s) / UC Diagnoses   Final diagnoses:  Viral illness     Discharge Instructions      Zyrtec -- one 5 mg tablet nightly This will help dry up  runny nose  Guaifenesin -- two 400 mg tablets every 4 hours This is for congestion Please get this over-the-counter at our pharmacy Take with LOTS of fluids!  If 5-6 days from now you are feeling worse, please return      ED Prescriptions     Medication Sig Dispense Auth. Provider   cetirizine (ZYRTEC) 10 MG tablet Take 0.5 tablets (5 mg total) by mouth at bedtime. 15 tablet Helmut Hennon, Lurena Joiner, PA-C      PDMP not reviewed this encounter.   Marlow Baars,  [place] 03/31/23 1419

## 2023-03-31 NOTE — ED Notes (Signed)
Patient requesting her urine be tested. States, "I just don't understand why I had chills this morning." Denies urinary frequency, pain, odor. States hx of renal stones.

## 2023-04-02 LAB — URINE CULTURE: Culture: >=100000 — AB

## 2023-04-03 ENCOUNTER — Other Ambulatory Visit (HOSPITAL_COMMUNITY): Payer: Self-pay | Admitting: Family Medicine

## 2023-04-03 ENCOUNTER — Telehealth (HOSPITAL_COMMUNITY): Payer: Self-pay

## 2023-04-03 DIAGNOSIS — B9620 Unspecified Escherichia coli [E. coli] as the cause of diseases classified elsewhere: Secondary | ICD-10-CM

## 2023-04-03 DIAGNOSIS — N390 Urinary tract infection, site not specified: Secondary | ICD-10-CM

## 2023-04-03 MED ORDER — CEFPODOXIME PROXETIL 100 MG PO TABS: 100.0000 mg | ORAL_TABLET | Freq: Two times a day (BID) | ORAL | 0 refills | Status: DC

## 2023-04-03 NOTE — Telephone Encounter (Signed)
TC to pt to advise of results and meds. Pharmacy updates per pt request. Verbalized understanding.

## 2023-04-04 DIAGNOSIS — Z01818 Encounter for other preprocedural examination: Secondary | ICD-10-CM | POA: Diagnosis not present

## 2023-04-04 DIAGNOSIS — H2511 Age-related nuclear cataract, right eye: Secondary | ICD-10-CM | POA: Diagnosis not present

## 2023-04-04 DIAGNOSIS — H2513 Age-related nuclear cataract, bilateral: Secondary | ICD-10-CM | POA: Diagnosis not present

## 2023-04-08 DIAGNOSIS — R509 Fever, unspecified: Secondary | ICD-10-CM | POA: Diagnosis not present

## 2023-04-08 DIAGNOSIS — R0981 Nasal congestion: Secondary | ICD-10-CM | POA: Diagnosis not present

## 2023-04-08 DIAGNOSIS — R059 Cough, unspecified: Secondary | ICD-10-CM | POA: Diagnosis not present

## 2023-04-25 DIAGNOSIS — E119 Type 2 diabetes mellitus without complications: Secondary | ICD-10-CM | POA: Diagnosis not present

## 2023-04-25 DIAGNOSIS — Z7984 Long term (current) use of oral hypoglycemic drugs: Secondary | ICD-10-CM | POA: Diagnosis not present

## 2023-04-25 DIAGNOSIS — H259 Unspecified age-related cataract: Secondary | ICD-10-CM | POA: Diagnosis not present

## 2023-04-25 DIAGNOSIS — H2511 Age-related nuclear cataract, right eye: Secondary | ICD-10-CM | POA: Diagnosis not present

## 2023-05-01 DIAGNOSIS — N200 Calculus of kidney: Secondary | ICD-10-CM | POA: Diagnosis not present

## 2023-05-02 DIAGNOSIS — N200 Calculus of kidney: Secondary | ICD-10-CM | POA: Diagnosis not present

## 2023-05-04 DIAGNOSIS — N952 Postmenopausal atrophic vaginitis: Secondary | ICD-10-CM | POA: Diagnosis not present

## 2023-05-04 DIAGNOSIS — N200 Calculus of kidney: Secondary | ICD-10-CM | POA: Diagnosis not present

## 2023-05-04 DIAGNOSIS — N390 Urinary tract infection, site not specified: Secondary | ICD-10-CM | POA: Diagnosis not present

## 2023-05-04 DIAGNOSIS — R3129 Other microscopic hematuria: Secondary | ICD-10-CM | POA: Diagnosis not present

## 2023-05-04 DIAGNOSIS — N3946 Mixed incontinence: Secondary | ICD-10-CM | POA: Diagnosis not present

## 2023-05-23 DIAGNOSIS — H52223 Regular astigmatism, bilateral: Secondary | ICD-10-CM | POA: Diagnosis not present

## 2023-05-23 DIAGNOSIS — H259 Unspecified age-related cataract: Secondary | ICD-10-CM | POA: Diagnosis not present

## 2023-05-23 DIAGNOSIS — I4891 Unspecified atrial fibrillation: Secondary | ICD-10-CM | POA: Diagnosis not present

## 2023-05-23 DIAGNOSIS — I10 Essential (primary) hypertension: Secondary | ICD-10-CM | POA: Diagnosis not present

## 2023-05-23 DIAGNOSIS — H2512 Age-related nuclear cataract, left eye: Secondary | ICD-10-CM | POA: Diagnosis not present

## 2023-05-23 DIAGNOSIS — E1136 Type 2 diabetes mellitus with diabetic cataract: Secondary | ICD-10-CM | POA: Diagnosis not present

## 2023-05-23 DIAGNOSIS — Z7984 Long term (current) use of oral hypoglycemic drugs: Secondary | ICD-10-CM | POA: Diagnosis not present

## 2023-05-23 DIAGNOSIS — Z79899 Other long term (current) drug therapy: Secondary | ICD-10-CM | POA: Diagnosis not present

## 2023-05-23 DIAGNOSIS — H04123 Dry eye syndrome of bilateral lacrimal glands: Secondary | ICD-10-CM | POA: Diagnosis not present

## 2023-05-23 DIAGNOSIS — Z7901 Long term (current) use of anticoagulants: Secondary | ICD-10-CM | POA: Diagnosis not present

## 2023-06-13 DIAGNOSIS — E1169 Type 2 diabetes mellitus with other specified complication: Secondary | ICD-10-CM | POA: Diagnosis not present

## 2023-06-13 DIAGNOSIS — I872 Venous insufficiency (chronic) (peripheral): Secondary | ICD-10-CM | POA: Diagnosis not present

## 2023-06-13 DIAGNOSIS — K7469 Other cirrhosis of liver: Secondary | ICD-10-CM | POA: Diagnosis not present

## 2023-06-13 DIAGNOSIS — E785 Hyperlipidemia, unspecified: Secondary | ICD-10-CM | POA: Diagnosis not present

## 2023-06-13 DIAGNOSIS — M810 Age-related osteoporosis without current pathological fracture: Secondary | ICD-10-CM | POA: Diagnosis not present

## 2023-06-13 DIAGNOSIS — I480 Paroxysmal atrial fibrillation: Secondary | ICD-10-CM | POA: Diagnosis not present

## 2023-06-19 DIAGNOSIS — Z961 Presence of intraocular lens: Secondary | ICD-10-CM | POA: Diagnosis not present

## 2023-06-19 DIAGNOSIS — N390 Urinary tract infection, site not specified: Secondary | ICD-10-CM | POA: Diagnosis not present

## 2023-06-19 DIAGNOSIS — H524 Presbyopia: Secondary | ICD-10-CM | POA: Diagnosis not present

## 2023-06-23 DIAGNOSIS — L821 Other seborrheic keratosis: Secondary | ICD-10-CM | POA: Diagnosis not present

## 2023-06-23 DIAGNOSIS — L219 Seborrheic dermatitis, unspecified: Secondary | ICD-10-CM | POA: Diagnosis not present

## 2023-06-23 DIAGNOSIS — L570 Actinic keratosis: Secondary | ICD-10-CM | POA: Diagnosis not present

## 2023-06-23 DIAGNOSIS — R233 Spontaneous ecchymoses: Secondary | ICD-10-CM | POA: Diagnosis not present

## 2023-09-06 DIAGNOSIS — N390 Urinary tract infection, site not specified: Secondary | ICD-10-CM | POA: Diagnosis not present

## 2023-09-21 DIAGNOSIS — E785 Hyperlipidemia, unspecified: Secondary | ICD-10-CM | POA: Diagnosis not present

## 2023-09-21 DIAGNOSIS — M810 Age-related osteoporosis without current pathological fracture: Secondary | ICD-10-CM | POA: Diagnosis not present

## 2023-09-21 DIAGNOSIS — M2041 Other hammer toe(s) (acquired), right foot: Secondary | ICD-10-CM | POA: Diagnosis not present

## 2023-09-21 DIAGNOSIS — E1169 Type 2 diabetes mellitus with other specified complication: Secondary | ICD-10-CM | POA: Diagnosis not present

## 2023-09-21 DIAGNOSIS — I480 Paroxysmal atrial fibrillation: Secondary | ICD-10-CM | POA: Diagnosis not present

## 2023-09-21 DIAGNOSIS — I872 Venous insufficiency (chronic) (peripheral): Secondary | ICD-10-CM | POA: Diagnosis not present

## 2023-09-21 DIAGNOSIS — K7469 Other cirrhosis of liver: Secondary | ICD-10-CM | POA: Diagnosis not present

## 2023-10-11 ENCOUNTER — Encounter: Payer: Self-pay | Admitting: Podiatry

## 2023-10-11 ENCOUNTER — Ambulatory Visit (INDEPENDENT_AMBULATORY_CARE_PROVIDER_SITE_OTHER)

## 2023-10-11 ENCOUNTER — Ambulatory Visit: Admitting: Podiatry

## 2023-10-11 ENCOUNTER — Other Ambulatory Visit: Payer: Self-pay | Admitting: Cardiology

## 2023-10-11 DIAGNOSIS — M21611 Bunion of right foot: Secondary | ICD-10-CM

## 2023-10-11 DIAGNOSIS — M21619 Bunion of unspecified foot: Secondary | ICD-10-CM

## 2023-10-11 DIAGNOSIS — M2041 Other hammer toe(s) (acquired), right foot: Secondary | ICD-10-CM

## 2023-10-11 DIAGNOSIS — M2011 Hallux valgus (acquired), right foot: Secondary | ICD-10-CM | POA: Diagnosis not present

## 2023-10-11 NOTE — Telephone Encounter (Signed)
 Prescription refill request for Xarelto  received.  Indication:afib Last office visit:11/24 Weight:71.8  kg Age:89 Scr:1.14  10/24 CrCl:40.15  ml/min  Prescription refilled

## 2023-10-11 NOTE — Progress Notes (Signed)
 Chief Complaint  Patient presents with   Bunions    Right bunion and 2nd hammertoe.  A1c was 6.7 in Aug Xarelto    HPI: 89 y.o. female presents today for bunion evaluation.  As she is having right bunion and second hammertoe pain that is progressively worsening over the past few years.  Patient is specifically requesting the Lapiplasty procedure.  She states that she is still either on TV or online that she could weight-bear within a few days of this procedure to correct her bunion.  Patient notes that she lives by herself, has to drive herself to appointments and for errands, does not have any family that can help her daily at home.  Denies any significant swelling or redness around the bunion.  She notes that shoe fit has been an issue.  Past Medical History:  Diagnosis Date   Allergy    Arthritis    Breast cancer (HCC) 09/17/13   Left iNVASIVE DUCTAL,dcis   Calculus of kidney    kidney stones   Carotid artery disease (HCC)    Followed by Dr. Eliza   CHF (congestive heart failure) (HCC)    Esophageal reflux    Insomnia, unspecified    Lumbosacral spondylosis without myelopathy    Orthostatic lightheadedness 10/20/2011   Osteoarthrosis, unspecified whether generalized or localized, unspecified site    Persistent atrial fibrillation (HCC)    PONV (postoperative nausea and vomiting)     Past Surgical History:  Procedure Laterality Date   BREAST LUMPECTOMY WITH NEEDLE LOCALIZATION AND AXILLARY SENTINEL LYMPH NODE BX Left 10/18/2013   Procedure: LEFT AXILLARY LYMPHATIC MAPPING; INJECTION OF METHYLENE BLUE  INTO LEFT BREAST; LEFT BREAST PARTIAL MASTECTOMY AFTER NEEDLE LOCALIZATION; AXILLARY SENTINEL LYMPH NODE BIOPSY;  Surgeon: Krystal Russell, MD;  Location: [facility] OR;  Service: General;  Laterality: Left;   DILATION AND CURETTAGE OF UTERUS  1960   KIDNEY STONE SURGERY     PORT-A-CATH REMOVAL Right 12/26/2014   Procedure: REMOVAL PORT-A-CATH;  Surgeon: Krystal Russell, MD;  Location: [facility]  [place];  Service: General;  Laterality: Right;   PORTACATH PLACEMENT N/A 10/18/2013   Procedure: INSERTION PORT-A-CATH/ULTRASOUND GUIDED,RIGHT INTERNAL JUGULAR;  Surgeon: Krystal Russell, MD;  Location: [facility] OR;  Service: General;  Laterality: N/A;   varicose veins      Allergies  Allergen Reactions   Flecainide  Shortness Of Breath, Swelling and Other (See Comments)    Sob, visual problems, swelling ankles   Nebivolol  Shortness Of Breath and Swelling    Sob, visual problems, swelling in ankles   Diltiazem  Hcl Swelling    Pt states it causes her ankle swelling    Lopressor  [Metoprolol  Tartrate]     Severe dizziness. Irregular heartbeat    Sodium Pantothenate Nausea And Vomiting    swelling   Tape Rash    PHYSICAL EXAM: General: The patient is alert and oriented x3 in no acute distress.  Dermatology: Skin is warm, dry and supple bilateral lower extremities. Interspaces are clear of maceration and debris.  No rashes noted.   Vascular: 1/4 palpable pedal pulses bilaterally. Capillary refill within normal limits.  No appreciable edema.  No erythema or calor.  Neurological: Light touch sensation grossly intact bilateral feet.   Musculoskeletal Exam:  There is a bony medial prominence on the dorsomedial aspect of the 1st metatarsal head of the right foot.  There is pain on palpation of the bump in this area.  Lateral deviation of the hallux at the MPJ level.  1st MPJ ROM is  decreased.  No crepitus.  Hallux is semiflexible when manipulating to rectus position.  There is flexible contracture of the second toe at the PIPJ and the DIPJ.  RADIOGRAPHIC EXAM (right foot, 3 weightbearing views, 10/11/2023):  Increased first intermetatarsal angle 14 degrees.  Increased hallux abductus angle.  Enlargement of bone at dorsomedial 1st metatarsal head.  Joint space is narrowed.  There is contracture of the right second toe at the PIPJ and DIPJ.  There is medial angulation of the 3rd toe at the  level of the MPJ.  ASSESSMENT/PLAN OF CARE: 1. Bunion, right foot   2. Hallux valgus (acquired), right foot   3. Hammertoe of second toe of right foot    Discussed patient's condition and possible etiologies at length today.  Discussed conservative treatment options with patient today, including shoe modification / arch supports, off-loading, cortisone injectione, NSAID topical / oral therapy, and toe splints and shields.  Briefly discussed surgical intervention if conservative options are not successful.    Specifically, we discussed the Lapiplasty procedure in detail.  I have performed it once but we do have several of our podiatrists within our group but I performed many of this procedure.  Yes, it is advertised as a procedure that let you weight-bear within a few days of surgery.  However, realistically, this has led to many delayed union's and nonunions since it is a fusion of the first metatarsal-medial cuneiform joint.  Our surgeons who typically perform this on a regular basis now have their patients nonweightbearing for at least 4 to 6 weeks to try to ensure adequate union in a timely fashion.  I would not recommend this procedure since she is completely independent and has no assistance at home as well as concern of risk of fall and how this may affect her inability to perform ADLs at home.  Since it is her right foot, she would not be able to drive at any point postoperatively while wearing the surgical shoe, so this would be at a minimum of 4 weeks no matter which bunion correction is performed.  Therefore, I recommend conservative measures only.  The intraoperative and postoperative surgical risks outweigh the benefits for this patient at this time.  Patient was fitted for a toe alignment splint and toe spacers.  She was shown when and how to wear these.  If patient feels conservative measures provided no relief and is still having pain to the area, may recommend a [employer] which  has the quickest recovery and may provide enough improvement for her with regard to shoe fit and toe alignment.  Would most likely need to correct the second hammertoe at the same time.  She has chronic diastolic heart failure, atrial fibrillation (takes Xarelto ), a carotid bruit and history of dizziness.  It would be in the patient's best interest if we can try to avoid surgery if possible.  Awanda CHARM Imperial, DPM, FACFAS [facility]     [address] [place], [place] [address]                Office [phone]  Fax [phone]

## 2023-11-02 DIAGNOSIS — H1032 Unspecified acute conjunctivitis, left eye: Secondary | ICD-10-CM | POA: Diagnosis not present

## 2023-11-13 DIAGNOSIS — B880 Other acariasis: Secondary | ICD-10-CM | POA: Diagnosis not present

## 2023-11-15 DIAGNOSIS — N952 Postmenopausal atrophic vaginitis: Secondary | ICD-10-CM | POA: Diagnosis not present

## 2023-11-15 DIAGNOSIS — N200 Calculus of kidney: Secondary | ICD-10-CM | POA: Diagnosis not present

## 2023-11-15 DIAGNOSIS — R3129 Other microscopic hematuria: Secondary | ICD-10-CM | POA: Diagnosis not present

## 2023-11-15 DIAGNOSIS — N390 Urinary tract infection, site not specified: Secondary | ICD-10-CM | POA: Diagnosis not present

## 2023-11-15 DIAGNOSIS — N3946 Mixed incontinence: Secondary | ICD-10-CM | POA: Diagnosis not present

## 2023-11-16 DIAGNOSIS — N200 Calculus of kidney: Secondary | ICD-10-CM | POA: Diagnosis not present

## 2023-11-23 DIAGNOSIS — Z1231 Encounter for screening mammogram for malignant neoplasm of breast: Secondary | ICD-10-CM | POA: Diagnosis not present

## 2023-11-23 LAB — HM MAMMOGRAPHY

## 2023-11-30 ENCOUNTER — Inpatient Hospital Stay: Payer: PPO | Admitting: Oncology

## 2023-12-01 ENCOUNTER — Inpatient Hospital Stay: Attending: Oncology | Admitting: Oncology

## 2023-12-01 ENCOUNTER — Other Ambulatory Visit: Payer: Self-pay | Admitting: Oncology

## 2023-12-01 ENCOUNTER — Encounter: Payer: Self-pay | Admitting: Oncology

## 2023-12-01 VITALS — BP 155/83 | HR 69 | Temp 98.0°F | Resp 16 | Ht 64.0 in | Wt 160.1 lb

## 2023-12-01 DIAGNOSIS — Z08 Encounter for follow-up examination after completed treatment for malignant neoplasm: Secondary | ICD-10-CM | POA: Diagnosis not present

## 2023-12-01 DIAGNOSIS — C50512 Malignant neoplasm of lower-outer quadrant of left female breast: Secondary | ICD-10-CM

## 2023-12-01 DIAGNOSIS — Z923 Personal history of irradiation: Secondary | ICD-10-CM | POA: Diagnosis not present

## 2023-12-01 DIAGNOSIS — Z853 Personal history of malignant neoplasm of breast: Secondary | ICD-10-CM | POA: Diagnosis not present

## 2023-12-01 DIAGNOSIS — Z8041 Family history of malignant neoplasm of ovary: Secondary | ICD-10-CM | POA: Insufficient documentation

## 2023-12-01 DIAGNOSIS — M810 Age-related osteoporosis without current pathological fracture: Secondary | ICD-10-CM | POA: Insufficient documentation

## 2023-12-01 DIAGNOSIS — Z9221 Personal history of antineoplastic chemotherapy: Secondary | ICD-10-CM | POA: Diagnosis not present

## 2023-12-01 DIAGNOSIS — Z170 Estrogen receptor positive status [ER+]: Secondary | ICD-10-CM | POA: Diagnosis not present

## 2023-12-01 NOTE — Progress Notes (Signed)
 [facility]  [address] [place],  [place]  [address] [phone]  Clinic Day:  12/01/23  Referring physician: Keren Vicenta BRAVO, MD  CHIEF COMPLAINT:  CC: History of stage IIA hormone receptor and HER2 positive left breast cancer  Current Treatment:  Surveillance  HISTORY OF PRESENT ILLNESS:  Colleen Galloway is a 89 y.o. female with a history of stage IIA (T2 N0 M0) hormone and HER 2 Neu receptor positive left breast cancer diagnosed in August 2015.  She was treated with lumpectomy.  Pathology revealed a 2.4 cm, grade 3, invasive ductal carcinoma with negative sentinel node.  Estrogen and progesterone receptors were positive and HER 2 positive.  Ki 67 was 78%.  She was given adjuvant chemotherapy with weekly Abraxane  and trastuzumab  for 12 weeks, which was completed at the end of December 2015.  She was referred to us  in January 2016 to complete her trastuzumab , which finished in October 2016.  She received adjuvant radiation to the left breast, completed in March 2016.  She was placed on hormonal therapy with anastrozole 1 mg daily in April 2016. She has had some mild elevation of the liver transaminases.  She had osteoporosis on bone density scan in January 2016, with a T-score of -2.8 in the femur and a T-score of -2.3 in the forearm.  She was placed on Prolia in May 2016, but then declined further Prolia, as she felt she had too many side effects.  We recommended treatment for the osteoporosis, as anastrozole has the potential to worsen this. She continues calcium and vitamin-D twice daily.  She has occasional episodes of atrial fibrillation and was found to be in atrial fibrillation in early October and saw Dr. Fernande, her cardiologist.  She was found to have pulmonary artery hypertension. She complains of a pinched nerve in her lumbar spine and slight scoliosis, and rates her back and hip pain at a 6/10. She saw Dr. Larene and had upper endoscopy, where she was found to have  a polyp of her stomach which was removed.  Dr. Keren does her labs on a regular basis.  She also had a bone density scan done in January of 2018, and this showed osteoporosis but the spine had improved by 2.5% and the femur by 3%.  Her T-score of the femur was still -2.5.  She has had her shingles vaccine. She completed 5 years of anastrozole in May 2021. Bone density scan from April 2021 revealed osteoporosis with a T-score of -3.1 of the left forearm radius, previously -2.5.  The right femur neck measures -2.7, previously -2.5, and the dual femur total mean is stable at -2.2.   INTERVAL HISTORY:  Colleen Galloway is here for annual follow up for her history of stage IIA hormone receptor and HER2 positive left breast cancer. She is now 10 years post-op with no evidence of disease. Patient states that she feels well, but complains of kidney stone-related pain. She completed her screening mammogram on 11/23/2023 which was negative. She had her last bone density scan last year, on 11/16/2022, which revealed osteoporosis of the femur right neck, osteopenia of the total femur, and osteoporosis of the left forearm radius. Because of the asymmetry of her breasts, I recommended [employer] to help with fitting of bras. She informed me that her sister passed away last year due to ovarian cancer. Her PCP does routine labs and I will see her back in 1 year with bilateral mammogram and bone density scan.  She denies fever, chills, night sweats, or other signs of infection. She denies cardiorespiratory and gastrointestinal issues. She  denies pain. Her appetite is great and Her weight has increased 2 pounds over last one year.  REVIEW OF SYSTEMS:  Review of Systems  Constitutional: Negative.  Negative for appetite change, chills, diaphoresis, fatigue, fever and unexpected weight change.  HENT:  Negative.  Negative for hearing loss, lump/mass, mouth sores, nosebleeds, sore throat, tinnitus, trouble swallowing and voice  change.   Eyes: Negative.  Negative for eye problems and icterus.  Respiratory: Negative.  Negative for chest tightness, cough, hemoptysis, shortness of breath and wheezing.   Cardiovascular: Negative.  Negative for chest pain, leg swelling and palpitations.  Gastrointestinal: Negative.  Negative for abdominal distention, abdominal pain, blood in stool, constipation, diarrhea, nausea, rectal pain and vomiting.  Endocrine: Negative.   Genitourinary: Negative.  Negative for bladder incontinence, difficulty urinating, dyspareunia, dysuria, frequency, hematuria, menstrual problem, nocturia, pelvic pain, vaginal bleeding and vaginal discharge.        Kidney stones  Musculoskeletal: Negative.  Negative for arthralgias, back pain, flank pain, gait problem, myalgias, neck pain and neck stiffness.       Soreness in the left breast  Skin: Negative.  Negative for itching, rash and wound.  Neurological: Negative.  Negative for dizziness, extremity weakness, gait problem, headaches, light-headedness, numbness, seizures and speech difficulty.  Hematological:  Negative for adenopathy. Bruises/bleeds easily.  Psychiatric/Behavioral: Negative.  Negative for confusion, decreased concentration, depression, sleep disturbance and suicidal ideas. The patient is not nervous/anxious.      VITALS:  Blood pressure (!) 155/83, pulse 69, temperature 98 F (36.7 C), temperature source Oral, resp. rate 16, height 5' 4 (1.626 m), weight 160 lb 1.6 oz (72.6 kg), SpO2 100%.  Wt Readings from Last 3 Encounters:  12/01/23 160 lb 1.6 oz (72.6 kg)  12/21/22 158 lb 6.4 oz (71.8 kg)  12/02/22 157 lb 9.6 oz (71.5 kg)    Body mass index is 27.48 kg/m.  Performance status (ECOG): 1 - Symptomatic but completely ambulatory  PHYSICAL EXAM:  Physical Exam Vitals and nursing note reviewed.  Constitutional:      General: She is not in acute distress.    Appearance: Normal appearance. She is normal weight. She is not  ill-appearing, toxic-appearing or diaphoretic.  HENT:     Head: Normocephalic and atraumatic.     Right Ear: Tympanic membrane, ear canal and external ear normal. There is no impacted cerumen.     Left Ear: Tympanic membrane, ear canal and external ear normal. There is no impacted cerumen.     Nose: No congestion or rhinorrhea.     Mouth/Throat:     Mouth: Mucous membranes are moist.     Pharynx: Oropharynx is clear. No oropharyngeal exudate or posterior oropharyngeal erythema.  Eyes:     General: No scleral icterus.       Right eye: No discharge.        Left eye: No discharge.     Extraocular Movements: Extraocular movements intact.     Conjunctiva/sclera: Conjunctivae normal.     Pupils: Pupils are equal, round, and reactive to light.  Neck:     Vascular: No carotid bruit.  Cardiovascular:     Rate and Rhythm: Normal rate and regular rhythm.     Pulses: Normal pulses.     Heart sounds: Normal heart sounds. No murmur heard.    No friction rub. No gallop.  Pulmonary:     Effort: Pulmonary  effort is normal. No respiratory distress.     Breath sounds: Normal breath sounds. No stridor. No wheezing, rhonchi or rales.  Chest:     Chest wall: No tenderness.     Comments: Large area of firmness in the upper outer quadrant of the left breast measuring 5cm across consistent with seroma, unchanged.  No masses in either breast Abdominal:     General: Bowel sounds are normal. There is no distension.     Palpations: Abdomen is soft. There is no hepatomegaly, splenomegaly or mass.     Tenderness: There is no abdominal tenderness. There is no right CVA tenderness, left CVA tenderness, guarding or rebound.     Hernia: No hernia is present.  Musculoskeletal:        General: No swelling, tenderness, deformity or signs of injury. Normal range of motion.     Cervical back: Normal range of motion and neck supple. No rigidity or tenderness.     Right lower leg: No edema.     Left lower leg: No  edema.  Lymphadenopathy:     Cervical: No cervical adenopathy.     Right cervical: No superficial, deep or posterior cervical adenopathy.    Left cervical: No superficial, deep or posterior cervical adenopathy.     Upper Body:     Right upper body: No supraclavicular, axillary or pectoral adenopathy.     Left upper body: No supraclavicular, axillary or pectoral adenopathy.  Skin:    General: Skin is warm and dry.     Coloration: Skin is not jaundiced or pale.     Findings: No bruising, erythema, lesion or rash.  Neurological:     General: No focal deficit present.     Mental Status: She is alert and oriented to person, place, and time. Mental status is at baseline.     Cranial Nerves: No cranial nerve deficit.     Sensory: No sensory deficit.     Motor: No weakness.     Coordination: Coordination normal.     Gait: Gait normal.     Deep Tendon Reflexes: Reflexes normal.  Psychiatric:        Mood and Affect: Mood normal.        Behavior: Behavior normal.        Thought Content: Thought content normal.        Judgment: Judgment normal.     LABS:      Latest Ref Rng & Units 03/24/2021    2:12 PM 08/06/2019   12:00 AM 03/14/2017    3:34 PM  CBC  WBC 3.4 - 10.8 x10E3/uL 5.3  5.5  8.5   Hemoglobin 11.1 - 15.9 g/dL 86.7  86.4  85.2   Hematocrit 34.0 - 46.6 % 40.0  41  43.3   Platelets 150 - 450 x10E3/uL 197  191  285       Latest Ref Rng & Units 03/24/2021    2:08 PM 08/06/2019   12:00 AM 10/31/2017    2:48 PM  CMP  Glucose 70 - 99 mg/dL 884   874   BUN 8 - 27 mg/dL 16  13  14    Creatinine 0.57 - 1.00 mg/dL 8.93  1.0  9.00   Sodium 134 - 144 mmol/L 141  139  142   Potassium 3.5 - 5.2 mmol/L 4.4  4.7  3.7   Chloride 96 - 106 mmol/L 102  104  104   CO2 20 - 29 mmol/L 21  24  22  Calcium 8.7 - 10.3 mg/dL 89.5  9.8  9.8   Alkaline Phos 25 - 125  87    AST 13 - 35  39    ALT 7 - 35  29      STUDIES:  EXAM: 11/23/2023 3D MAMMOGRAM SCREENING DIGITAL W/CAD IMPRESSION: There  is no mammographic evidence of malignancy. Routine mammographic evaluation in 1 year is recommended.  Exam: 11/17/2022 3D Mammogram Screening  Digital W/CAD  Impression: Stable left lumpectomy.  No mammographic evidence of malignancy.   EXAM: 11/16/2023 DXA FOR BONE MINERAL DENSITY IMPRESSION: Dual femur neck right: T-score -2.7 Dual femur total: T-score -2.4 Left forearm radius T-score -3.5  HISTORY:   Allergies:  Allergies  Allergen Reactions   Flecainide  Shortness Of Breath, Swelling and Other (See Comments)    Sob, visual problems, swelling ankles   Nebivolol  Shortness Of Breath and Swelling    Sob, visual problems, swelling in ankles   Diltiazem  Hcl Swelling    Pt states it causes her ankle swelling    Lopressor  [Metoprolol  Tartrate]     Severe dizziness. Irregular heartbeat    Sodium Pantothenate Nausea And Vomiting    swelling   Tape Rash    Current Medications: Current Outpatient Medications  Medication Sig Dispense Refill   doxycycline (VIBRAMYCIN) 50 MG capsule Take 50 mg by mouth daily as needed.     fenofibrate 160 MG tablet Take 160 mg by mouth daily after supper.      meclizine  (ANTIVERT ) 25 MG tablet Take 1 tablet (25 mg total) by mouth 3 (three) times daily as needed for dizziness. 90 tablet 2   metFORMIN (GLUCOPHAGE) 500 MG tablet Take 500 mg by mouth daily.     metoprolol  succinate (TOPROL -XL) 25 MG 24 hr tablet Take 75 mg by mouth daily.     Multiple Vitamin (MULTIVITAMIN) capsule Take 1 capsule by mouth daily.       omeprazole (PRILOSEC) 20 MG capsule Take 20 mg by mouth daily.   (Patient taking differently: Take 20 mg by mouth daily as needed.)     Rivaroxaban  (XARELTO ) 15 MG TABS tablet Take 1 tablet (15 mg total) by mouth daily with supper. 90 tablet 3   XARELTO  15 MG TABS tablet TAKE 1 TABLET (15 MG TOTAL) BY MOUTH DAILY WITH SUPPER 90 tablet 3   calcium citrate-vitamin D (CITRACAL+D) 315-200 MG-UNIT per tablet Take 1 tablet by mouth daily.       cetirizine  (ZYRTEC ) 10 MG tablet Take 0.5 tablets (5 mg total) by mouth at bedtime. 15 tablet 0   conjugated estrogens (PREMARIN) vaginal cream Place 1 applicator vaginally at bedtime. 3 times weekly only     furosemide  (LASIX ) 20 MG tablet Take 1 tablet (20 mg total) by mouth daily. (Patient taking differently: Take 20 mg by mouth as needed.) 90 tablet 3   Methenamine-Sodium Salicylate (AZO URINARY TRACT DEFENSE PO) Take by mouth. As needed     Vaginal Lubricant (REPLENS VA) Place vaginally as needed.     No current facility-administered medications for this visit.     ASSESSMENT & PLAN:  Assessment/Plan:   History of stage IIA left breast cancer diagnosed in August 2015, over 10 years ago which was hormone positive and HER 2 positive.  She was treated with surgery, chemotherapy, HER 2 directed therapy, adjuvant radiation and hormonal therapy.  She completed 5 years of anastrozole in May 2021.   Osteoporosis. Her bone density scan shows some worsening but she is now off the hormonal  therapy. She had her last bone density scan last year, on 11/16/2022, which revealed osteoporosis of the femur right neck, osteopenia of the total femur, and osteoporosis of the left forearm radius.  Plan: She is now 10 years post-op with no evidence of disease. Patient states that she feels well, but complains of kidney stone-related pain. She completed her screening mammogram on 11/23/2023 which was negative. She had her last bone density scan last year, on 11/16/2022, which revealed osteoporosis of the femur right neck, osteopenia of the total femur, and osteoporosis of the left forearm radius. Because of the asymmetry of her breasts, I recommended [employer] to help with fitting of bras. Her PCP does routine labs and I will see her back in 1 year with bilateral mammogram and bone density scan. She understands and agrees with this plan of care.  I provided 21 minutes of face-to-face time during this this  encounter and > 50% was spent counseling as documented under my assessment and plan.    Wanda VEAR Cornish, MD  [facility] - A DEPT OF [facility] [address] [place] [address] Dept: [phone] Dept Fax: [phone]    I,Marcelina Mclaurin H Seletha Zimmermann,acting as a scribe for Wanda VEAR Cornish, MD.,have documented all relevant documentation on the behalf of Wanda VEAR Cornish, MD,as directed by  Wanda VEAR Cornish, MD while in the presence of Wanda VEAR Cornish, MD.  I have reviewed this report as typed by the medical scribe, and it is complete and accurate.

## 2023-12-19 DIAGNOSIS — L820 Inflamed seborrheic keratosis: Secondary | ICD-10-CM | POA: Diagnosis not present

## 2023-12-19 DIAGNOSIS — L578 Other skin changes due to chronic exposure to nonionizing radiation: Secondary | ICD-10-CM | POA: Diagnosis not present

## 2023-12-19 DIAGNOSIS — L719 Rosacea, unspecified: Secondary | ICD-10-CM | POA: Diagnosis not present

## 2023-12-19 DIAGNOSIS — L570 Actinic keratosis: Secondary | ICD-10-CM | POA: Diagnosis not present

## 2023-12-28 DIAGNOSIS — C50512 Malignant neoplasm of lower-outer quadrant of left female breast: Secondary | ICD-10-CM | POA: Diagnosis not present

## 2023-12-28 DIAGNOSIS — K7469 Other cirrhosis of liver: Secondary | ICD-10-CM | POA: Diagnosis not present

## 2023-12-28 DIAGNOSIS — I5032 Chronic diastolic (congestive) heart failure: Secondary | ICD-10-CM | POA: Diagnosis not present

## 2023-12-28 DIAGNOSIS — I872 Venous insufficiency (chronic) (peripheral): Secondary | ICD-10-CM | POA: Diagnosis not present

## 2023-12-28 DIAGNOSIS — I480 Paroxysmal atrial fibrillation: Secondary | ICD-10-CM | POA: Diagnosis not present

## 2023-12-28 DIAGNOSIS — E1169 Type 2 diabetes mellitus with other specified complication: Secondary | ICD-10-CM | POA: Diagnosis not present

## 2023-12-28 DIAGNOSIS — M810 Age-related osteoporosis without current pathological fracture: Secondary | ICD-10-CM | POA: Diagnosis not present

## 2023-12-28 DIAGNOSIS — E785 Hyperlipidemia, unspecified: Secondary | ICD-10-CM | POA: Diagnosis not present

## 2023-12-29 LAB — LAB REPORT - SCANNED
A1c: 6.6
Albumin, Urine POC: 13.2
Creatinine, POC: 79.6 mg/dL
EGFR: 49
Microalb Creat Ratio: 17

## 2024-01-03 ENCOUNTER — Encounter: Payer: Self-pay | Admitting: Oncology

## 2024-01-23 NOTE — Progress Notes (Unsigned)
  Electrophysiology Office Follow up Visit Note:    Date:  01/23/2024   ID:  Colleen Galloway, DOB 02-25-1934, MRN [id number]  PCP:  Keren Vicenta BRAVO, MD  [facility] Cardiologist:  None  [facility] Electrophysiologist:  Elspeth Sage, MD (Inactive)    Interval History:     Colleen Galloway is a 89 y.o. female who presents for a follow up visit.   The patient last saw [facility] December 21, 2022.  The patient has a history of atrial fibrillation, atrial flutter, hypertension, chronic diastolic heart failure, CKD 3 and pulmonary hypertension.  She is on Xarelto  for stroke prophylaxis.        Past medical, surgical, social and family history were reviewed.  ROS:   Please see the history of present illness.    All other systems reviewed and are negative.  EKGs/Labs/Other Studies Reviewed:    The following studies were reviewed today:          Physical Exam:    VS:  There were no vitals taken for this visit.    Wt Readings from Last 3 Encounters:  12/01/23 160 lb 1.6 oz (72.6 kg)  12/21/22 158 lb 6.4 oz (71.8 kg)  12/02/22 157 lb 9.6 oz (71.5 kg)     GEN: no distress CARD: RRR, No MRG RESP: No IWOB. CTAB.      ASSESSMENT:    No diagnosis found. PLAN:    In order of problems listed above:  #Persistent atrial fibrillation Likely permanent.  Plan for rate control strategy. Continue metoprolol  Continue Xarelto   #Hypertension *** goal today.  Recommend checking blood pressures 1-2 times per week at home and recording the values.  Recommend bringing these recordings to the primary care physician.  I discussed my upcoming departure from [facility] during today's clinic appointment.  The patient will continue to follow-up with one of my EP partners moving forward.  Follow-up with the EP APP in 1 year   Signed, Ole Holts, MD, [facility], [facility] 01/23/2024 8:30 AM    Electrophysiology [facility]

## 2024-01-25 ENCOUNTER — Encounter: Payer: Self-pay | Admitting: Cardiology

## 2024-01-25 ENCOUNTER — Ambulatory Visit: Attending: Internal Medicine | Admitting: Cardiology

## 2024-01-25 VITALS — BP 162/86 | HR 76 | Ht 64.0 in | Wt 159.6 lb

## 2024-01-25 DIAGNOSIS — I4819 Other persistent atrial fibrillation: Secondary | ICD-10-CM | POA: Diagnosis not present

## 2024-01-25 DIAGNOSIS — I5032 Chronic diastolic (congestive) heart failure: Secondary | ICD-10-CM

## 2024-02-14 ENCOUNTER — Telehealth: Payer: Self-pay | Admitting: *Deleted

## 2024-02-14 DIAGNOSIS — I509 Heart failure, unspecified: Secondary | ICD-10-CM | POA: Insufficient documentation

## 2024-02-14 DIAGNOSIS — R112 Nausea with vomiting, unspecified: Secondary | ICD-10-CM | POA: Insufficient documentation

## 2024-02-14 DIAGNOSIS — K219 Gastro-esophageal reflux disease without esophagitis: Secondary | ICD-10-CM | POA: Insufficient documentation

## 2024-02-14 DIAGNOSIS — I779 Disorder of arteries and arterioles, unspecified: Secondary | ICD-10-CM | POA: Insufficient documentation

## 2024-02-14 DIAGNOSIS — N200 Calculus of kidney: Secondary | ICD-10-CM | POA: Insufficient documentation

## 2024-02-14 DIAGNOSIS — G4700 Insomnia, unspecified: Secondary | ICD-10-CM | POA: Insufficient documentation

## 2024-02-14 DIAGNOSIS — T7840XA Allergy, unspecified, initial encounter: Secondary | ICD-10-CM | POA: Insufficient documentation

## 2024-02-14 DIAGNOSIS — M47817 Spondylosis without myelopathy or radiculopathy, lumbosacral region: Secondary | ICD-10-CM | POA: Insufficient documentation

## 2024-02-14 DIAGNOSIS — M1990 Unspecified osteoarthritis, unspecified site: Secondary | ICD-10-CM | POA: Insufficient documentation

## 2024-02-14 NOTE — Telephone Encounter (Signed)
 Patient called back to report that she re-reviewed Dr. Hiram note and is now able to see that her BP was rechecked at 144/74 at her 01/25/24 OV. She denies additional needs at this time.

## 2024-02-14 NOTE — Telephone Encounter (Signed)
 Spoke with patient to discuss concerns about recent OV with Dr. Cindie and to provide service recovery. Pt was instructed to follow-up with EP PRN, but she feels regular follow-up is warranted. Pt is interested in seeing Dr. Inocencio in [place].  Discussed with Dr. Inocencio, who recommended that pt establish with a primary cardiologist based on her medical history. He is willing to see her if cardiologist feels EP follow-up is indicated.  Pt is aware and expressed agreement with this plan. Scheduled for a new patient appointment with Dr. Liborio in [place] on 02/21/24 at 3:40 pm. Pt aware of office location.   Pt checked BP this morning and it was 162/76, HR 60 bpm. Takes metoprolol  succinate 75 mg in the evening. Advised pt to log several BPs and bring to her appointment on 02/21/24. She verbalized understanding.  She currently has lab work every 3 months at [facility] office. Advised that I will call Dr. Margarete office to obtain her most recent lab work from November so that it is on file in advance of her OV. Pt expressed appreciation of assistance. Provided direct number for any additional questions/concerns in the future.

## 2024-02-14 NOTE — Telephone Encounter (Signed)
 Spoke with receptionist at Dr. Margarete office to request copy of most recent labs. She agreed to fax results over today.

## 2024-02-21 ENCOUNTER — Ambulatory Visit: Payer: Self-pay

## 2024-02-21 ENCOUNTER — Ambulatory Visit

## 2024-02-21 VITALS — BP 130/70 | HR 73 | Ht 64.0 in | Wt 162.0 lb

## 2024-02-21 DIAGNOSIS — E782 Mixed hyperlipidemia: Secondary | ICD-10-CM

## 2024-02-21 DIAGNOSIS — I10 Essential (primary) hypertension: Secondary | ICD-10-CM

## 2024-02-21 DIAGNOSIS — I5032 Chronic diastolic (congestive) heart failure: Secondary | ICD-10-CM

## 2024-02-21 DIAGNOSIS — I4819 Other persistent atrial fibrillation: Secondary | ICD-10-CM

## 2024-02-21 DIAGNOSIS — I6523 Occlusion and stenosis of bilateral carotid arteries: Secondary | ICD-10-CM

## 2024-02-21 NOTE — Progress Notes (Signed)
 "  Cardiology Consultation:    Date:  02/21/2024   ID:  Colleen Galloway, DOB [birth year], MRN [id number]  PCP:  Colleen Vicenta BRAVO, MD  Cardiologist:  Alean SAUNDERS Berkley Cronkright, MD   Referring MD: Colleen Vicenta BRAVO, MD   No chief complaint on file.    ASSESSMENT AND PLAN:   Colleen Galloway 89 year old woman with history of history of permanent atrial fibrillation on anticoagulation with apixaban , atrial flutter, hypertension, chronic diastolic heart failure, CKD stage III, pulmonary hypertension, breast cancer s/p partial mastectomy and chemo-radiation therapy in 2015, mild nonobstructive carotid artery disease [last ultrasound September 2022]. Last echocardiogram 12/28/2021 with normal LVEF 60 to 65%, RV dilated with normal function, severe biatrial dilatation, mild MR, moderate TR, RVSP 42 mmHg.   Last stress test Myoview  from September 2012 showed no ischemia.  Was routinely following up with electrophysiologist initially Dr. Fernande and later with Dr. Cindie. Here for follow-up visit establishing care with general cardiology.   Problem List Items Addressed This Visit       Cardiovascular and Mediastinum   Chronic diastolic heart failure (HCC) - Primary   Euvolemic and compensated. Last echocardiogram was November 2023 EF 60 to 65%, severe biatrial dilatation with underlying atrial fibrillation mild MR and moderate TR, RVSP 42 mmHg.  Continue low-salt diet less than 2 g/day. Continue furosemide  20 mg on an as-needed basis.  Considering elderly age and relative [place] no significant symptoms hold off on escalating therapy with ACE inhibitor/ARB or Entresto or SGLT2 inhibitors.       Relevant Medications   furosemide  (LASIX ) 20 MG tablet   Other Relevant Orders   ECHOCARDIOGRAM COMPLETE   LONG TERM MONITOR (3-14 DAYS)   Persistent atrial fibrillation (HCC)   Longstanding history of atrial fibrillation, deemed permanent A-fib at last visit by Dr. Cindie. She tends to report no  symptoms.  Will assess with Zio patch monitor for 14 days to rule out any significant other underlying arrhythmias.  CHA2DS2-VASc score elevated. Continue Xarelto  15 mg once daily.       Relevant Medications   furosemide  (LASIX ) 20 MG tablet   Other Relevant Orders   ECHOCARDIOGRAM COMPLETE   LONG TERM MONITOR (3-14 DAYS)   Carotid artery disease   Previously following up with Dr. Melvenia. Mild nonobstructive carotid disease on last ultrasound 2022. She wanted to continue empiric therapy and did not pursue any further follow-up. Remains asymptomatic. Continue lipid-lowering therapy as discussed under hyperlipidemia.  Remains on anticoagulation with Xarelto  15 mg for A-fib.      Relevant Medications   furosemide  (LASIX ) 20 MG tablet   Essential hypertension   Blood pressure suboptimal. Continue metoprolol  XL 25 mg once daily. Given elderly age Target blood pressure below 150/80 mmHg. Will hold off on escalating therapy at this time.      Relevant Medications   furosemide  (LASIX ) 20 MG tablet   Other Relevant Orders   EKG 12-Lead (Completed)     Other   Mixed hyperlipidemia   Lipid panel from 12/28/2023 reviewed  total cholesterol 137, triglycerides 74, HDL 45, LDL 77.  Continue fenofibrate. Currently not on any statins.  Will review repeat lipid panel at subsequent follow-up visits and if remains optimal I will hold off on statins or discuss addition of statins      Relevant Medications   furosemide  (LASIX ) 20 MG tablet   Multiple questions of hers were answered today. This was a visit to establish care. Will review the echocardiogram Zio patch results at  subsequent follow-up visits extensively along with lipid panel further. Discussed with her that her diagnosis of pulmonary hypertension is more likely related to her diastolic CHF and not necessarily pulmonary arterial hypertension as she does not have any significant functional limitations and I do not see any  previous assessment of pulmonary arterial hypertension.  Return to clinic in 3 months.   History of Present Illness:    Colleen Galloway is a 89 y.o. female who is being seen today for follow-up visit. PCP is Colleen Vicenta BRAVO, MD. Recently had follow-up visit 01/25/2024 with Dr. Cindie, electrophysiologist at [facility] in [place].  Pleasant woman here for the visit by herself.  Lives by herself at home.  Able to manage her own daily affairs and does drive routinely.  Does not have any biological children.  Her husband passed with few years ago.  Her younger sister passed away 5 months ago.  Has several nephews and nieces 1 of whom lives in town.  Has history of permanent atrial fibrillation on anticoagulation with apixaban , atrial flutter, hypertension, chronic diastolic heart failure, CKD stage III, pulmonary hypertension, breast cancer s/p partial mastectomy and chemo-radiation therapy in 2015, mild nonobstructive carotid artery disease [last ultrasound September 2022]. Last echocardiogram 12/28/2021 with normal LVEF 60 to 65%, RV dilated with normal function, severe biatrial dilatation, mild MR, moderate TR, RVSP 42 mmHg. Last stress test Myoview  from September 2012 showed no ischemia.  EKG in the clinic today shows atrial fibrillation with ventricular rate 73/min narrow QRS 82 ms, nonspecific ST-T changes inferior leads with T wave inversions.  No significant change in comparison to prior EKG from 12/21/2022.  Blood work from [facility] 12/28/2023 total cholesterol 137, triglycerides 74, HDL 45, LDL 77. Hemoglobin 12.6, hematocrit 40.1, WBC 4.8 and platelets 179. Hemoglobin A1c 6.6. BUN 21, creatinine 1.09, eGFR 49 Sodium 142 and potassium 4.3. Normal transaminases and alkaline phosphatase.  Mentions overall on a day-to-day basis she does not have any significant limitations other than age-related slowing down. Notes in contrast to where she used to take a  day to clean up her house and now she has to divide it into batches of cleaning over few days.  Denies any significant chest pain or shortness of breath with exertion. Does report atypical chest pain that last for few seconds occurs randomly not associated with any shortness of breath. Denies any syncopal episodes. Denies any recent falls. Denies any blood in urine or stools.  Good compliance with her medications. Uses Lasix  20 mg as needed last used over 4 weeks ago for ankle edema.   Mentions blood pressures at home can fluctuate between 130-160 systolic. Brought blood pressure log for the last 10 days recording readings on 3 separate days.   Past Medical History:  Diagnosis Date   Allergy    Arthritis    Breast cancer (HCC) 09/17/2013   Left iNVASIVE DUCTAL,dcis   Calculus of kidney    kidney stones   Carotid artery disease    Followed by Dr. Eliza   Carotid bruit 10/20/2010   CHF (congestive heart failure) (HCC)    Chronic diastolic heart failure (HCC) 10/20/2011   Dizziness 01/15/2013   Esophageal reflux    Family history of colon cancer    Family history of rectal cancer    Insomnia, unspecified    Lumbosacral spondylosis without myelopathy    Mixed hyperlipidemia 10/20/2010   Orthostatic lightheadedness 10/20/2011   Osteoporosis 11/06/2017   PAF (paroxysmal atrial fibrillation) (  HCC) 11/24/2010   Persistent atrial fibrillation (HCC)    PONV (postoperative nausea and vomiting)     Past Surgical History:  Procedure Laterality Date   BREAST LUMPECTOMY WITH NEEDLE LOCALIZATION AND AXILLARY SENTINEL LYMPH NODE BX Left 10/18/2013   Procedure: LEFT AXILLARY LYMPHATIC MAPPING; INJECTION OF METHYLENE BLUE  INTO LEFT BREAST; LEFT BREAST PARTIAL MASTECTOMY AFTER NEEDLE LOCALIZATION; AXILLARY SENTINEL LYMPH NODE BIOPSY;  Surgeon: Krystal Russell, MD;  Location: [facility] OR;  Service: General;  Laterality: Left;   DILATION AND CURETTAGE OF UTERUS  1960   KIDNEY STONE SURGERY      PORT-A-CATH REMOVAL Right 12/26/2014   Procedure: REMOVAL PORT-A-CATH;  Surgeon: Krystal Russell, MD;  Location: [facility];  Service: General;  Laterality: Right;   PORTACATH PLACEMENT N/A 10/18/2013   Procedure: INSERTION PORT-A-CATH/ULTRASOUND GUIDED,RIGHT INTERNAL JUGULAR;  Surgeon: Krystal Russell, MD;  Location: [facility] OR;  Service: General;  Laterality: N/A;   varicose veins      Current Medications: Active Medications[1]   Allergies:   Flecainide , Nebivolol , Diltiazem  hcl, Lopressor  [metoprolol  tartrate], Sodium pantothenate, and Tape   Social History   Socioeconomic History   Marital status: Widowed    Spouse name: Not on file   Number of children: Not on file   Years of education: Not on file   Highest education level: Not on file  Occupational History   Not on file  Tobacco Use   Smoking status: Never   Smokeless tobacco: Never  Vaping Use   Vaping status: Never Used  Substance and Sexual Activity   Alcohol use: No   Drug use: No   Sexual activity: Not on file  Other Topics Concern   Not on file  Social History Narrative   Non smoker/ no tobacco use   Current work/retired   Martial status/widowed   Non drinker/ no alcohol use   Seat belt use/ always wears seatbelt   Caffeine use   Guns in the home         Social Drivers of Health   Tobacco Use: Low Risk (02/21/2024)   Patient History    Smoking Tobacco Use: Never    Smokeless Tobacco Use: Never    Passive Exposure: Not on file  Financial Resource Strain: Not on file  Food Insecurity: Not on file  Transportation Needs: Not on file  Physical Activity: Not on file  Stress: Not on file  Social Connections: Not on file  Depression (PHQ2-9): Low Risk (12/01/2023)   Depression (PHQ2-9)    PHQ-2 Score: 0  Alcohol Screen: Not on file  Housing: Not on file  Utilities: Not on file  Health Literacy: Not on file     Family History: The patient's family history includes Asthma in her brother and  mother; Colon cancer (age of onset: 70) in an other family member; Colon cancer (age of onset: 16) in her maternal grandfather; Deep vein thrombosis in her brother and mother; Diabetes in her brother; Heart disease in her brother, father, mother, and sister; Hyperlipidemia in her sister; Hypertension in her brother, father, mother, and sister; Liver disease (age of onset: 58) in her brother; Lung cancer (age of onset: 69) in her mother; Melanoma in an other family member; Other in her mother; Rectal cancer (age of onset: 55) in her sister; Stroke in her father. ROS:   Please see the history of present illness.    All 14 point review of systems negative except as described per history of present illness.  EKGs/Labs/Other Studies Reviewed:  The following studies were reviewed today:   EKG:  EKG Interpretation Date/Time:  Wednesday February 21 2024 15:58:19 EST Ventricular Rate:  73 PR Interval:    QRS Duration:  82 QT Interval:  380 QTC Calculation: 418 R Axis:   -26  Text Interpretation: Atrial fibrillation ST & T wave abnormality, consider inferior ischemia Abnormal ECG When compared with ECG of 21-Dec-2022 10:34, No significant change was found Confirmed by Liborio Hai reddy [phone]) on 02/21/2024 4:12:59 PM    Recent Labs: No results found for requested labs within last 365 days.  Recent Lipid Panel    Component Value Date/Time   CHOL 167 08/06/2019 0000   TRIG 131 08/06/2019 0000   HDL 47 08/06/2019 0000   LDLCALC 97 08/06/2019 0000    Physical Exam:    VS:  BP 130/70   Pulse 73   Ht 5' 4 (1.626 m)   Wt 162 lb (73.5 kg)   SpO2 96%   BMI 27.81 kg/m     Wt Readings from Last 3 Encounters:  02/21/24 162 lb (73.5 kg)  01/25/24 159 lb 9.6 oz (72.4 kg)  12/01/23 160 lb 1.6 oz (72.6 kg)     GENERAL:  Well nourished, well developed in no acute distress NECK: No JVD; No carotid bruits CARDIAC: Irregularly irregular, S1 and S2 present, no murmurs, no rubs, no  gallops CHEST:  Clear to auscultation without rales, wheezing or rhonchi  Extremities: No pitting pedal edema. Pulses bilaterally symmetric with radial 2+ and dorsalis pedis 2+ NEUROLOGIC:  Alert and oriented x 3  Medication Adjustments/Labs and Tests Ordered: Current medicines are reviewed at length with the patient today.  Concerns regarding medicines are outlined above.  Orders Placed This Encounter  Procedures   LONG TERM MONITOR (3-14 DAYS)   EKG 12-Lead   ECHOCARDIOGRAM COMPLETE   No orders of the defined types were placed in this encounter.   Signed, Hai reddy Elandra Powell, MD, MPH, [facility]. 02/21/2024 4:47 PM    [facility]     [1]  Current Meds  Medication Sig   calcium citrate-vitamin D (CITRACAL+D) 315-200 MG-UNIT per tablet Take 1 tablet by mouth daily.    cetirizine  (ZYRTEC ) 10 MG tablet Take 0.5 tablets (5 mg total) by mouth at bedtime.   conjugated estrogens (PREMARIN) vaginal cream Place 1 applicator vaginally at bedtime. 3 times weekly only   D-Mannose 500 MG CAPS Take 500 mg by mouth daily.   doxycycline (VIBRAMYCIN) 50 MG capsule Take 50 mg by mouth daily as needed.   fenofibrate 160 MG tablet Take 160 mg by mouth daily after supper.    furosemide  (LASIX ) 20 MG tablet Take 20 mg by mouth as needed.   meclizine  (ANTIVERT ) 25 MG tablet Take 1 tablet (25 mg total) by mouth 3 (three) times daily as needed for dizziness.   metFORMIN (GLUCOPHAGE) 500 MG tablet Take 500 mg by mouth daily.   Methenamine-Sodium Salicylate (AZO URINARY TRACT DEFENSE PO) Take by mouth. As needed   metoprolol  succinate (TOPROL -XL) 25 MG 24 hr tablet Take 75 mg by mouth daily.   Multiple Vitamin (MULTIVITAMIN) capsule Take 1 capsule by mouth daily.     omeprazole (PRILOSEC) 20 MG capsule Take 20 mg by mouth as needed.   Rivaroxaban  (XARELTO ) 15 MG TABS tablet Take 1 tablet (15 mg total) by mouth daily with supper.   vitamin E 1000 UNIT capsule Take 1,000 Units by  mouth daily.   "

## 2024-02-21 NOTE — Patient Instructions (Signed)
 Medication Instructions:  Your physician recommends that you continue on your current medications as directed. Please refer to the Current Medication list given to you today.  *If you need a refill on your cardiac medications before your next appointment, please call your pharmacy*   Lab Work: None ordered If you have labs (blood work) drawn today and your tests are completely normal, you will receive your results only by: MyChart Message (if you have MyChart) OR A paper copy in the mail If you have any lab test that is abnormal or we need to change your treatment, we will call you to review the results.   Testing/Procedures: Your physician has requested that you have an echocardiogram. Echocardiography is a painless test that uses sound waves to create images of your heart. It provides your doctor with information about the size and shape of your heart and how well your heart's chambers and valves are working. This procedure takes approximately one hour. There are no restrictions for this procedure. Please do NOT wear cologne, perfume, aftershave, or lotions (deodorant is allowed). Please arrive 15 minutes prior to your appointment time.  Please note: We ask at that you not bring children with you during ultrasound (echo/ vascular) testing. Due to room size and safety concerns, children are not allowed in the ultrasound rooms during exams. Our front office staff cannot provide observation of children in our lobby area while testing is being conducted. An adult accompanying a patient to their appointment will only be allowed in the ultrasound room at the discretion of the ultrasound technician under special circumstances. We apologize for any inconvenience.  Follow-Up: At [facility], you and your health needs are our priority.  As part of our continuing mission to provide you with exceptional heart care, we have created designated Provider Care Teams.  These Care Teams include your primary  Cardiologist (physician) and Advanced Practice Providers (APPs -  Physician Assistants and Nurse Practitioners) who all work together to provide you with the care you need, when you need it.  We recommend signing up for the patient portal called "MyChart".  Sign up information is provided on this After Visit Summary.  MyChart is used to connect with patients for Virtual Visits (Telemedicine).  Patients are able to view lab/test results, encounter notes, upcoming appointments, etc.  Non-urgent messages can be sent to your provider as well.   To learn more about what you can do with MyChart, go to [email or web address].    Your next appointment:   3 month(s)  The format for your next appointment:   In Person  Provider:   Tereasa Felty Madireddy, MD   Other Instructions Echocardiogram An echocardiogram is a test that uses sound waves (ultrasound) to produce images of the heart. Images from an echocardiogram can provide important information about: Heart size and shape. The size and thickness and movement of your heart's walls. Heart muscle function and strength. Heart valve function or if you have stenosis. Stenosis is when the heart valves are too narrow. If blood is flowing backward through the heart valves (regurgitation). A tumor or infectious growth around the heart valves. Areas of heart muscle that are not working well because of poor blood flow or injury from a heart attack. Aneurysm detection. An aneurysm is a weak or damaged part of an artery wall. The wall bulges out from the normal force of blood pumping through the body. Tell a health care provider about: Any allergies you have. All medicines you are taking, including vitamins,  herbs, eye drops, creams, and over-the-counter medicines. Any blood disorders you have. Any surgeries you have had. Any medical conditions you have. Whether you are pregnant or may be pregnant. What are the risks? Generally, this is a safe test.  However, problems may occur, including an allergic reaction to dye (contrast) that may be used during the test. What happens before the test? No specific preparation is needed. You may eat and drink normally. What happens during the test? You will take off your clothes from the waist up and put on a hospital gown. Electrodes or electrocardiogram (ECG)patches may be placed on your chest. The electrodes or patches are then connected to a device that monitors your heart rate and rhythm. You will lie down on a table for an ultrasound exam. A gel will be applied to your chest to help sound waves pass through your skin. A handheld device, called a transducer, will be pressed against your chest and moved over your heart. The transducer produces sound waves that travel to your heart and bounce back (or "echo" back) to the transducer. These sound waves will be captured in real-time and changed into images of your heart that can be viewed on a video monitor. The images will be recorded on a computer and reviewed by your health care provider. You may be asked to change positions or hold your breath for a short time. This makes it easier to get different views or better views of your heart. In some cases, you may receive contrast through an IV in one of your veins. This can improve the quality of the pictures from your heart. The procedure may vary among health care providers and hospitals.   What can I expect after the test? You may return to your normal, everyday life, including diet, activities, and medicines, unless your health care provider tells you not to do that. Follow these instructions at home: It is up to you to get the results of your test. Ask your health care provider, or the department that is doing the test, when your results will be ready. Keep all follow-up visits. This is important. Summary An echocardiogram is a test that uses sound waves (ultrasound) to produce images of the heart. Images  from an echocardiogram can provide important information about the size and shape of your heart, heart muscle function, heart valve function, and other possible heart problems. You do not need to do anything to prepare before this test. You may eat and drink normally. After the echocardiogram is completed, you may return to your normal, everyday life, unless your health care provider tells you not to do that. This information is not intended to replace advice given to you by your health care provider. Make sure you discuss any questions you have with your health care provider. Document Revised: 09/24/2019 Document Reviewed: 09/24/2019 Elsevier Patient Education  2021 Elsevier Inc.   Important Information About Sugar

## 2024-02-21 NOTE — Assessment & Plan Note (Signed)
 Blood pressure suboptimal. Continue metoprolol  XL 25 mg once daily. Given elderly age Target blood pressure below 150/80 mmHg. Will hold off on escalating therapy at this time.

## 2024-02-21 NOTE — Assessment & Plan Note (Signed)
 Previously following up with Dr. Melvenia. Mild nonobstructive carotid disease on last ultrasound 2022. She wanted to continue empiric therapy and did not pursue any further follow-up. Remains asymptomatic. Continue lipid-lowering therapy as discussed under hyperlipidemia.  Remains on anticoagulation with Xarelto  15 mg for A-fib.

## 2024-02-21 NOTE — Assessment & Plan Note (Signed)
 Euvolemic and compensated. Last echocardiogram was November 2023 EF 60 to 65%, severe biatrial dilatation with underlying atrial fibrillation mild MR and moderate TR, RVSP 42 mmHg.  Continue low-salt diet less than 2 g/day. Continue furosemide  20 mg on an as-needed basis.  Considering elderly age and relative [place] no significant symptoms hold off on escalating therapy with ACE inhibitor/ARB or Entresto or SGLT2 inhibitors.

## 2024-02-21 NOTE — Assessment & Plan Note (Addendum)
 Longstanding history of atrial fibrillation, deemed permanent A-fib at last visit by Dr. Cindie. She tends to report no symptoms.  Will assess with Zio patch monitor for 14 days to rule out any significant other underlying arrhythmias.  CHA2DS2-VASc score elevated. Continue Xarelto  15 mg once daily.

## 2024-02-21 NOTE — Assessment & Plan Note (Signed)
 Lipid panel from 12/28/2023 reviewed  total cholesterol 137, triglycerides 74, HDL 45, LDL 77.  Continue fenofibrate. Currently not on any statins.  Will review repeat lipid panel at subsequent follow-up visits and if remains optimal I will hold off on statins or discuss addition of statins

## 2024-02-22 ENCOUNTER — Ambulatory Visit

## 2024-02-22 DIAGNOSIS — I4819 Other persistent atrial fibrillation: Secondary | ICD-10-CM

## 2024-02-22 DIAGNOSIS — I5032 Chronic diastolic (congestive) heart failure: Secondary | ICD-10-CM

## 2024-02-23 LAB — ECHOCARDIOGRAM COMPLETE
Area-P 1/2: 4.49 cm2
MV M vel: 4.71 m/s
MV Peak grad: 88.7 mmHg
Radius: 0.3 cm
S' Lateral: 3.1 cm

## 2024-03-14 ENCOUNTER — Ambulatory Visit: Payer: Self-pay

## 2024-03-14 ENCOUNTER — Telehealth: Payer: Self-pay

## 2024-03-14 DIAGNOSIS — I4819 Other persistent atrial fibrillation: Secondary | ICD-10-CM

## 2024-03-14 DIAGNOSIS — I5032 Chronic diastolic (congestive) heart failure: Secondary | ICD-10-CM

## 2024-03-14 NOTE — Telephone Encounter (Signed)
 Calling to report abnormal Zio Monitor results. Caller hung up waiting for nurses to take call

## 2024-03-14 NOTE — Telephone Encounter (Signed)
 Called [employer] for the zio alert. Spoke to Olivia at [facility] and she reported that the patient had an episode of slow A-fib at a rate of 35 bpm for 60 seconds which can be found on pages 18 and 19 strip number 5.Printed out the report and informed Dr. Liborio.

## 2024-12-04 ENCOUNTER — Inpatient Hospital Stay: Admitting: Oncology
